# Patient Record
Sex: Female | Born: 1980 | Race: White | Hispanic: No | Marital: Married | Smoking: Never smoker
Health system: Southern US, Community
[De-identification: ages and names within clinical notes are randomized; demographics above are authoritative.]

## PROBLEM LIST (undated history)

## (undated) DIAGNOSIS — G43909 Migraine, unspecified, not intractable, without status migrainosus: Secondary | ICD-10-CM

## (undated) DIAGNOSIS — F419 Anxiety disorder, unspecified: Secondary | ICD-10-CM

## (undated) DIAGNOSIS — E109 Type 1 diabetes mellitus without complications: Secondary | ICD-10-CM

## (undated) DIAGNOSIS — E282 Polycystic ovarian syndrome: Secondary | ICD-10-CM

## (undated) DIAGNOSIS — Z8619 Personal history of other infectious and parasitic diseases: Secondary | ICD-10-CM

## (undated) HISTORY — DX: Type 1 diabetes mellitus without complications: E10.9

## (undated) HISTORY — DX: Polycystic ovarian syndrome: E28.2

## (undated) HISTORY — DX: Anxiety disorder, unspecified: F41.9

## (undated) HISTORY — DX: Personal history of other infectious and parasitic diseases: Z86.19

## (undated) HISTORY — DX: Migraine, unspecified, not intractable, without status migrainosus: G43.909

---
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
## 2004-03-21 ENCOUNTER — Observation Stay (HOSPITAL_COMMUNITY): Admission: AD | Admit: 2004-03-21 | Discharge: 2004-03-22 | Payer: Self-pay | Admitting: Family Medicine

## 2004-11-11 ENCOUNTER — Ambulatory Visit (HOSPITAL_COMMUNITY): Admission: RE | Admit: 2004-11-11 | Discharge: 2004-11-11 | Payer: Self-pay | Admitting: Family Medicine

## 2005-05-09 ENCOUNTER — Emergency Department (HOSPITAL_COMMUNITY): Admission: EM | Admit: 2005-05-09 | Discharge: 2005-05-09 | Payer: Self-pay | Admitting: Emergency Medicine

## 2007-04-19 ENCOUNTER — Ambulatory Visit (HOSPITAL_COMMUNITY): Admission: RE | Admit: 2007-04-19 | Discharge: 2007-04-19 | Payer: Self-pay | Admitting: Family Medicine

## 2010-04-13 HISTORY — PX: BREAST BIOPSY: SHX20

## 2010-08-03 ENCOUNTER — Emergency Department (HOSPITAL_COMMUNITY): Payer: BC Managed Care – PPO

## 2010-08-03 ENCOUNTER — Emergency Department (HOSPITAL_COMMUNITY)
Admission: EM | Admit: 2010-08-03 | Discharge: 2010-08-03 | Disposition: A | Discharge status: Home / Self Care | Payer: BC Managed Care – PPO | Attending: Emergency Medicine | Admitting: Emergency Medicine

## 2010-08-03 DIAGNOSIS — E109 Type 1 diabetes mellitus without complications: Secondary | ICD-10-CM | POA: Insufficient documentation

## 2010-08-03 DIAGNOSIS — R197 Diarrhea, unspecified: Secondary | ICD-10-CM | POA: Insufficient documentation

## 2010-08-03 DIAGNOSIS — R109 Unspecified abdominal pain: Secondary | ICD-10-CM | POA: Insufficient documentation

## 2010-08-03 DIAGNOSIS — Z794 Long term (current) use of insulin: Secondary | ICD-10-CM | POA: Insufficient documentation

## 2010-08-03 DIAGNOSIS — R112 Nausea with vomiting, unspecified: Secondary | ICD-10-CM | POA: Insufficient documentation

## 2010-08-03 LAB — DIFFERENTIAL
Basophils Absolute: 0 10*3/uL (ref 0.0–0.1)
Basophils Relative: 0 % (ref 0–1)
Eosinophils Relative: 1 % (ref 0–5)
Monocytes Relative: 4 % (ref 3–12)

## 2010-08-03 LAB — CBC
Platelets: 287 10*3/uL (ref 150–400)
WBC: 16.8 10*3/uL — ABNORMAL HIGH (ref 4.0–10.5)

## 2010-08-03 LAB — BASIC METABOLIC PANEL
BUN: 12 mg/dL (ref 6–23)
Calcium: 9.1 mg/dL (ref 8.4–10.5)
Chloride: 100 mEq/L (ref 96–112)
Creatinine, Ser: 0.73 mg/dL (ref 0.4–1.2)
GFR calc Af Amer: >60 mL/min (ref >60)
GFR calc non Af Amer: >60 mL/min (ref >60)
Glucose, Bld: 145 mg/dL — ABNORMAL HIGH (ref 70–99)
Sodium: 134 mEq/L — ABNORMAL LOW (ref 135–145)

## 2010-08-03 LAB — GLUCOSE, CAPILLARY: Glucose-Capillary: 158 mg/dL — ABNORMAL HIGH (ref 70–99)

## 2010-08-03 MED ORDER — IOHEXOL 300 MG/ML  SOLN
100.0000 mL | Freq: Once | INTRAMUSCULAR | Status: AC | PRN
  Administered 2010-08-03: 100 mL via INTRAVENOUS

## 2010-08-29 NOTE — H&P (Signed)
NAME:  Kimberly Becker, FARNELL NO.:  [id number]   MEDICAL RECORD NO.:  [id number]          PATIENT TYPE:  INP   LOCATION:  A325                          FACILITY:  [facility]   PHYSICIAN:  Donna Bernard, M.D.DATE OF BIRTH:  1981/02/23   DATE OF ADMISSION:  03/21/2004  DATE OF DISCHARGE:  LH                                HISTORY & PHYSICAL   CHIEF COMPLAINT:  Vomiting, fever, sore throat.   SUBJECTIVE:  This patient is a 30 year old white female with a history of  type 1 diabetes who presented to the office the day of admission with  several complaints.  The patient had been doing well until the day prior  when she developed onset of sore throat and headache.  The patient felt achy  all over.  She developed fever.  She had a T max of 102 the night prior to  admission.  The patient developed recurrent vomiting with inability to eat  or drink.  Of course, this is a concern when it comes to the patient's  underlying diabetes.  This morning, the patient's glucose was 82, and she  has vomited several times since then, unable to keep anything down.  The  patient notes a severe sore throat.  She has a history of recurrent throat  infections in the past.   CURRENT MEDICATIONS:  1.  Lantus 42 units q.h.s.  2.  Sliding scale Humalog before meals.  3.  Tylenol p.r.n. for fever.   PRIOR SURGERIES:  Remote appendectomy.   FAMILY HISTORY:  Positive for hypertension, diabetes, coronary artery  disease.   SOCIAL HISTORY:  The patient's is single.  She has one child.  No tobacco  use.  No alcohol use.   ALLERGIES:  Bee stings only.  No medications.   REVIEW OF SYSTEMS:  Otherwise negative.   PHYSICAL EXAMINATION:  VITAL SIGNS:  BP 112/70, T-max last night was 102.  Current temperature 99.  GENERAL:  The patient is alert, moderate malaise.  Glucose the morning of  admission was 82.  HEENT:  Exudative tonsillitis noted.  Tender anterior nodes in the neck.  NECK:  Otherwise  supple.  LUNGS:  Clear.  HEART:  Regular rate and rhythm.  No CVA tenderness.  ABDOMEN:  Good bowel sounds, no rebound, no guarding.  EXTREMITIES:  Normal.  NEUROLOGIC:  Exam is intact.   LABORATORY DATA:  Rapid strep screen is negative.   Significant labs:  CBC 21,000, white blood count with left shift.  Urinalysis normal.   IMPRESSION:  1.  Febrile illness with exudative tonsillitis and gastroenteritis features      with recurrent significant vomiting, and inability to keep anything      down.  2.  Type 1 diabetes with borderline hypoglycemia.   PLAN:  Admit for IV hydration, IV antibiotics.  Further blood work if  necessary.  Reglan as needed for nausea and Rocephin 1 g q.24 hours.  Further orders as noted on the chart.     Kristine Royal   WSL/MEDQ  D:  03/21/2004  T:  03/21/2004  Job:  [id number]

## 2010-11-11 ENCOUNTER — Encounter: Payer: Self-pay | Admitting: Endocrinology

## 2010-11-11 ENCOUNTER — Ambulatory Visit (INDEPENDENT_AMBULATORY_CARE_PROVIDER_SITE_OTHER): Payer: Medicaid Other | Admitting: Endocrinology

## 2010-11-11 DIAGNOSIS — E282 Polycystic ovarian syndrome: Secondary | ICD-10-CM

## 2010-11-11 DIAGNOSIS — E109 Type 1 diabetes mellitus without complications: Secondary | ICD-10-CM

## 2010-11-11 NOTE — Patient Instructions (Addendum)
good diet and exercise habits significanly improve the control of your diabetes.  please let me know if you wish to be referred to a dietician.  high blood sugar is very risky to your health.  you should see an eye doctor every year. controlling your blood pressure and cholesterol drastically reduces the damage diabetes does to your body.  this also applies to quitting smoking.  please discuss these with your doctor.  you should take an aspirin every day, unless you have been advised by a doctor not to. check your blood sugar 4 times a day--before the 3 meals, and at bedtime.  also check if you have symptoms of your blood sugar being too high or too low.  please keep a record of the readings and bring it to your next appointment here.  please call us sooner if you are having low blood sugar episodes. we will need to take this complex situation in stages For now, take humalog 10 units with breakfast, 8 with lunch, and 10 with supper.  You should take this no matter what your blood sugar is.   Please make a follow-up appointment in 1-2 weeks

## 2010-11-11 NOTE — Progress Notes (Signed)
Subjective:    Patient ID: Kimberly Becker, female    DOB: 11/10/1980, 30 y.o.   MRN: [id number]  HPI pt states 13 years h/o dm.  she is unaware of any chronic complications.  he has been on insulin since dx.  she takes lantus, and prn humalog. She reports several seizures in the middle of the night, over the past few years.  She says these seizures are described as severe tonus of the entire body, and assoc loc.   Since lantus was stopped, she still has cbg's of 80 in the middle of the night.  This is despite the fact that it has been more than 6 hrs since her last dose of humalog.  pt says his diet and exercise are "average." Past Medical History  Diagnosis Date  . Type I (juvenile type) diabetes mellitus without mention of complication, not stated as uncontrolled   . PCOS (polycystic ovarian syndrome)   . History of chickenpox     Past Surgical History  Procedure Date  . Appendectomy 1992  . Breast biopsy 2012    benign    History   Social History  . Marital Status: Single    Spouse Name: N/A    Number of Children: N/A  . Years of Education: 14   Occupational History  . Preschool Teacher    Social History Main Topics  . Smoking status: Former Games developer  . Smokeless tobacco: Not on file  . Alcohol Use: No  . Drug Use: No  . Sexually Active: Not on file   Other Topics Concern  . Not on file   Social History Narrative  . No narrative on file    Current Outpatient Prescriptions on File Prior to Visit  Medication Sig Dispense Refill  . fish oil-omega-3 fatty acids 1000 MG capsule Take 1 g by mouth daily.        . insulin glargine (LANTUS) 100 UNIT/ML injection Inject 20 Units into the skin at bedtime.        . insulin lispro (HUMALOG) 100 UNIT/ML injection Use as directed-sliding scale         No Known Allergies  Family History  Problem Relation Age of Onset  . Cancer Mother     Ovarian Cancer  . Heart disease Maternal Grandfather   . Diabetes Paternal  Grandfather   . Cancer Other     Breast Cancer-Aunt  no dm in immediate family  BP 112/78  Pulse 51  Temp(Src) 98.1 F (36.7 C) (Oral)  Ht 5\' 3"  (1.6 m)  Wt 223 lb (101.152 kg)  BMI 39.50 kg/m2  SpO2 96%  LMP 10/13/2010  Review of Systems denies weight loss, blurry vision, headache, chest pain, sob, n/v, urinary frequency, cramps, excessive diaphoresis, memory loss, depression, and easy bruising.  She reports irreg menses.    Objective:   Physical Exam VS: see vs page GEN: no distress HEAD: head: no deformity eyes: no periorbital swelling, no proptosis external nose and ears are normal mouth: no lesion seen NECK: supple, thyroid is not enlarged CHEST WALL: no deformity CV: reg rate and rhythm, no murmur ABD: abdomen is soft, nontender.  no hepatosplenomegaly.  not distended.  no hernia MUSCULOSKELETAL: muscle bulk and strength are grossly normal.  no obvious joint swelling.  gait is normal and steady EXTEMITIES: no deformity.  no ulcer on the feet.  feet are of normal color and temp.  no edema PULSES: dorsalis pedis intact bilat.  no carotid bruit NEURO:  cn  2-12 grossly intact.   readily moves all 4's.  sensation is intact to touch on the feet SKIN:  Normal texture and temperature.  No rash or suspicious lesion is visible.   NODES:  None palpable at the neck PSYCH: alert, oriented x3.  Does not appear anxious nor depressed.    outside test results are reviewed: A1c=7.3  Assessment & Plan:  She seems to have type 1 dm, but it will take several visits to understand her insulin requirements.  She says she is having hypoglycemia many hrs after a dose of humalog.  This would argue against type 1 dm, but i still think she has type 1 dm. Severe hypoglycemia, recurrent irreg menses, prob related to pco

## 2010-11-12 ENCOUNTER — Telehealth: Payer: Self-pay

## 2010-11-12 DIAGNOSIS — E282 Polycystic ovarian syndrome: Secondary | ICD-10-CM | POA: Insufficient documentation

## 2010-11-12 DIAGNOSIS — E109 Type 1 diabetes mellitus without complications: Secondary | ICD-10-CM | POA: Insufficient documentation

## 2010-11-12 NOTE — Telephone Encounter (Signed)
One time, take an extra 5 units of humalog now. Starting tonight, start lantus 5 units qhs.

## 2010-11-12 NOTE — Telephone Encounter (Signed)
Pt advised and expressed understanding.

## 2010-11-12 NOTE — Telephone Encounter (Signed)
Pt called stating her blood sugar has been increasing overnight. Before bed yesterday CBG was 200, at midnight 230, 2:30 am 300, 5 am 500 and 559 when she woke up this morning at 7:30. Pt is requesting advisement or medication adjustment, please advise.

## 2010-11-20 ENCOUNTER — Ambulatory Visit (INDEPENDENT_AMBULATORY_CARE_PROVIDER_SITE_OTHER): Payer: Medicaid Other | Admitting: Endocrinology

## 2010-11-20 ENCOUNTER — Encounter: Payer: Self-pay | Admitting: Endocrinology

## 2010-11-20 DIAGNOSIS — E109 Type 1 diabetes mellitus without complications: Secondary | ICD-10-CM

## 2010-11-20 NOTE — Patient Instructions (Addendum)
check your blood sugar 4 times a day--before the 3 meals, and at bedtime.  also check if you have symptoms of your blood sugar being too high or too low.  please keep a record of the readings and bring it to your next appointment here.  please call us sooner if you are having low blood sugar episodes. we will need to take this complex situation in stages For now, take humalog 11 units with breakfast, 9 with lunch, and 11 with supper.  You should take this no matter what your blood sugar is.   Please make a follow-up appointment in 1 month. For now, do not take the lantus.

## 2010-11-20 NOTE — Progress Notes (Signed)
  Subjective:    Patient ID: Kimberly Becker, female    DOB: February 19, 1981, 30 y.o.   MRN: [id number]  HPI she brings a record of her cbg's which i have reviewed today.  She has not been taking the lantus.  Instead, she has been taking 5 units of humalog qhs.  She is having hypoglycemia in the middle of the night.  cbg is about the same in am as at hs.  cbg's otherwise are mostly in the 200's.  There is no trend throughout the day.   Past Medical History  Diagnosis Date  . Type I (juvenile type) diabetes mellitus without mention of complication, not stated as uncontrolled   . PCOS (polycystic ovarian syndrome)   . History of chickenpox     Past Surgical History  Procedure Date  . Appendectomy 1992  . Breast biopsy 2012    benign    History   Social History  . Marital Status: Single    Spouse Name: N/A    Number of Children: N/A  . Years of Education: 14   Occupational History  . Preschool Teacher    Social History Main Topics  . Smoking status: Former Games developer  . Smokeless tobacco: Not on file  . Alcohol Use: No  . Drug Use: No  . Sexually Active: Not on file   Other Topics Concern  . Not on file   Social History Narrative  . No narrative on file    Current Outpatient Prescriptions on File Prior to Visit  Medication Sig Dispense Refill  . fish oil-omega-3 fatty acids 1000 MG capsule Take 1 g by mouth daily.        . insulin lispro (HUMALOG) 100 UNIT/ML injection 10 units with breakfast, 8 with lunch, and 10 with supper        No Known Allergies  Family History  Problem Relation Age of Onset  . Cancer Mother     Ovarian Cancer  . Heart disease Maternal Grandfather   . Diabetes Paternal Grandfather   . Cancer Other     Breast Cancer-Aunt    BP 108/68  Pulse 64  Temp(Src) 98.3 F (36.8 C) (Oral)  Ht 5\' 3"  (1.6 m)  Wt 223 lb (101.152 kg)  BMI 39.50 kg/m2  SpO2 98%  LMP 10/13/2010  Review of Systems denies hypoglycemia    Objective:   Physical  Exam GENERAL: no distress SKIN: Insulin injection sites at the anterior abdomen are normal.     Assessment & Plan:  Dm.  It is still not clear whether or not she needs the lantus

## 2010-12-30 ENCOUNTER — Ambulatory Visit (INDEPENDENT_AMBULATORY_CARE_PROVIDER_SITE_OTHER): Payer: Medicaid Other | Admitting: Endocrinology

## 2010-12-30 ENCOUNTER — Encounter: Payer: Self-pay | Admitting: Endocrinology

## 2010-12-30 VITALS — BP 108/66 | HR 82 | Temp 98.7°F | Ht 63.0 in | Wt 219.8 lb

## 2010-12-30 DIAGNOSIS — E109 Type 1 diabetes mellitus without complications: Secondary | ICD-10-CM

## 2010-12-30 MED ORDER — INSULIN GLARGINE 100 UNIT/ML ~~LOC~~ SOLN: 5.0000 [IU] | Freq: Every day | SUBCUTANEOUS | Status: DC

## 2010-12-30 MED ORDER — INSULIN LISPRO 100 UNIT/ML ~~LOC~~ SOLN: SUBCUTANEOUS | Status: DC

## 2010-12-30 NOTE — Progress Notes (Signed)
  Subjective:    Patient ID: Kimberly Becker, female    DOB: Jun 12, 1980, 30 y.o.   MRN: [id number]  HPI pt states she feels well in general.  she brings a record of her cbg's which i have reviewed today.  It varies from 100-400.  It is in general highest in am, and lower at all other times, but not necessarily so. Past Medical History  Diagnosis Date  . Type I (juvenile type) diabetes mellitus without mention of complication, not stated as uncontrolled   . PCOS (polycystic ovarian syndrome)   . History of chickenpox     Past Surgical History  Procedure Date  . Appendectomy 1992  . Breast biopsy 2012    benign    History   Social History  . Marital Status: Single    Spouse Name: N/A    Number of Children: N/A  . Years of Education: 14   Occupational History  . Preschool Teacher    Social History Main Topics  . Smoking status: Former Games developer  . Smokeless tobacco: Not on file  . Alcohol Use: No  . Drug Use: No  . Sexually Active: Not on file   Other Topics Concern  . Not on file   Social History Narrative  . No narrative on file    Current Outpatient Prescriptions on File Prior to Visit  Medication Sig Dispense Refill  . fish oil-omega-3 fatty acids 1000 MG capsule Take 1 g by mouth daily.          No Known Allergies  Family History  Problem Relation Age of Onset  . Cancer Mother     Ovarian Cancer  . Heart disease Maternal Grandfather   . Diabetes Paternal Grandfather   . Cancer Other     Breast Cancer-Aunt    BP 108/66  Pulse 82  Temp(Src) 98.7 F (37.1 C) (Oral)  Ht 5\' 3"  (1.6 m)  Wt 219 lb 12.8 oz (99.701 kg)  BMI 38.94 kg/m2  SpO2 96%  LMP 12/08/2010  Review of Systems denies hypoglycemia     Objective:   Physical Exam VITAL SIGNS:  See vs page GENERAL: no distress SKIN: Insulin injection sites at the anterior abdomen are normal     Assessment & Plan:  Dm.  Based on the pattern of her cbg's, she needs resumption of the lantus,  and a slight increase of her overall number of units per day.

## 2010-12-30 NOTE — Patient Instructions (Addendum)
check your blood sugar 4 times a day--before the 3 meals, and at bedtime.  also check if you have symptoms of your blood sugar being too high or too low.  please keep a record of the readings and bring it to your next appointment here.  please call us sooner if you are having low blood sugar episodes. we will need to take this complex situation in stages reduce humalog to 10 units with breakfast, 8 with lunch, and 10 with supper.  You should take this no matter what your blood sugar is.   Please make a follow-up appointment in 1 month. Resume the lantus, 5 units at bedtime

## 2010-12-31 ENCOUNTER — Telehealth: Payer: Self-pay | Admitting: *Deleted

## 2010-12-31 NOTE — Telephone Encounter (Signed)
R'cd fax from [employer] for PA for [employer]. Humalog vial is preferred over pen per pt's insurance. Pt will need PA for Humalog Kwikpen.

## 2010-12-31 NOTE — Telephone Encounter (Signed)
Left message for pt to callback office.  

## 2010-12-31 NOTE — Telephone Encounter (Signed)
i would be happy to do pa.  However, the form will want me to attest to a visual or hand problem making it impossible or unsafe to take the vial.  Since there is a charge for the pa form, please ask pt:  Would she qualify for the pen?

## 2011-01-02 MED ORDER — INSULIN ASPART 100 UNIT/ML ~~LOC~~ SOLN: 3.0000 [IU] | Freq: Three times a day (TID) | SUBCUTANEOUS | Status: DC

## 2011-01-02 NOTE — Telephone Encounter (Signed)
Pt states that she does wish to proceed with PA-however insurance preferred alternative is the Novolog flexpen

## 2011-01-02 NOTE — Telephone Encounter (Signed)
i changed to novolog, and sent rx

## 2011-01-02 NOTE — Telephone Encounter (Signed)
Pt advised per pharmacy

## 2011-01-02 NOTE — Telephone Encounter (Signed)
Left message for pt to callback office.  

## 2011-01-22 ENCOUNTER — Ambulatory Visit: Payer: Medicaid Other | Admitting: Endocrinology

## 2011-02-09 ENCOUNTER — Ambulatory Visit: Payer: Medicaid Other | Admitting: Endocrinology

## 2011-09-02 ENCOUNTER — Ambulatory Visit (HOSPITAL_COMMUNITY)
Admission: RE | Admit: 2011-09-02 | Discharge: 2011-09-02 | Disposition: A | Discharge status: Home / Self Care | Payer: BC Managed Care – PPO | Source: Ambulatory Visit | Attending: Family Medicine | Admitting: Family Medicine

## 2011-09-02 ENCOUNTER — Other Ambulatory Visit: Payer: Self-pay | Admitting: Family Medicine

## 2011-09-02 DIAGNOSIS — W19XXXA Unspecified fall, initial encounter: Secondary | ICD-10-CM | POA: Insufficient documentation

## 2011-09-02 DIAGNOSIS — M25511 Pain in right shoulder: Secondary | ICD-10-CM

## 2011-09-02 DIAGNOSIS — M25519 Pain in unspecified shoulder: Secondary | ICD-10-CM | POA: Insufficient documentation

## 2012-04-26 ENCOUNTER — Telehealth: Payer: Self-pay | Admitting: Endocrinology

## 2012-04-26 NOTE — Telephone Encounter (Signed)
Transferred Care

## 2012-04-29 ENCOUNTER — Telehealth: Payer: Self-pay | Admitting: *Deleted

## 2012-04-29 NOTE — Telephone Encounter (Signed)
FORMS FOR MED-CARE FAXED BACK TO [facility]CARE. PATIENT IS NOT A PATIENT HERE. SEES HER PCP ONLY.

## 2012-07-11 ENCOUNTER — Other Ambulatory Visit: Payer: Self-pay | Admitting: Nurse Practitioner

## 2012-08-08 ENCOUNTER — Encounter: Payer: Self-pay | Admitting: *Deleted

## 2012-08-09 ENCOUNTER — Other Ambulatory Visit: Payer: Self-pay | Admitting: Family Medicine

## 2012-08-10 ENCOUNTER — Ambulatory Visit (INDEPENDENT_AMBULATORY_CARE_PROVIDER_SITE_OTHER): Payer: Medicaid Other | Admitting: Family Medicine

## 2012-08-10 ENCOUNTER — Encounter: Payer: Self-pay | Admitting: Family Medicine

## 2012-08-10 VITALS — BP 122/80 | HR 80 | Ht 62.5 in | Wt 218.4 lb

## 2012-08-10 DIAGNOSIS — Z0000 Encounter for general adult medical examination without abnormal findings: Secondary | ICD-10-CM

## 2012-08-10 DIAGNOSIS — Z79899 Other long term (current) drug therapy: Secondary | ICD-10-CM

## 2012-08-10 DIAGNOSIS — E119 Type 2 diabetes mellitus without complications: Secondary | ICD-10-CM

## 2012-08-10 DIAGNOSIS — G43909 Migraine, unspecified, not intractable, without status migrainosus: Secondary | ICD-10-CM | POA: Insufficient documentation

## 2012-08-10 LAB — BASIC METABOLIC PANEL
BUN: 12 mg/dL (ref 6–23)
CO2: 26 mEq/L (ref 19–32)
Calcium: 9.5 mg/dL (ref 8.4–10.5)
Creat: 0.78 mg/dL (ref 0.50–1.10)

## 2012-08-10 LAB — HEPATIC FUNCTION PANEL
ALT: 23 U/L (ref 0–35)
AST: 19 U/L (ref 0–37)
Albumin: 4 g/dL (ref 3.5–5.2)
Bilirubin, Direct: 0.1 mg/dL (ref 0.0–0.3)
Total Protein: 7.2 g/dL (ref 6.0–8.3)

## 2012-08-10 LAB — LIPID PANEL
Cholesterol: 220 mg/dL — ABNORMAL HIGH (ref 0–200)
HDL: 52 mg/dL (ref >39)
LDL Cholesterol: 134 mg/dL — ABNORMAL HIGH (ref 0–99)
Triglycerides: 169 mg/dL — ABNORMAL HIGH (ref <150)

## 2012-08-10 MED ORDER — TOPIRAMATE 100 MG PO TABS: 100.0000 mg | ORAL_TABLET | Freq: Two times a day (BID) | ORAL | Status: DC

## 2012-08-10 NOTE — Progress Notes (Signed)
RX called in on voicemail 

## 2012-08-10 NOTE — Patient Instructions (Signed)
Morning sugars goal is 95 to 130.  Increase Lantus to 12 unit  If am # are consistently above the goals If in the range of 170 or higher that is a sign that you can add 4 units to your amount of Lantus that you are using at nighttime. If  your sugars for the next few days are still in we'll high range add 4 more units if they're only slightly above the goal then add 2 units. You can certainly do these type of adjustments on your between visits. If your morning sugars are coming in below 95 that is a sign your on too much and you will  have to reduce the insulin  Minimize starches  vigourous walking 30 minutes 4 to 5 times a week.  Twice a week muscle work out  Public house manager daily/ yearly eye exam

## 2012-08-10 NOTE — Progress Notes (Signed)
  Subjective:    Patient ID: Kimberly Becker, female    DOB: Aug 07, 1980, 32 y.o.   MRN: [id number]  Diabetes She presents for her follow-up diabetic visit. She has type 1 diabetes mellitus. Her disease course has been stable. There are no hypoglycemic associated symptoms. There are no diabetic associated symptoms. There are no hypoglycemic complications. There are no diabetic complications. There are no known risk factors for coronary artery disease. Current diabetic treatment includes insulin injections. She is compliant with treatment all of the time.   She does relate that her morning sugars have been running in the 120-200 range she recently stopped drinking [employer]. She understands the importance of maintaining a low sugar low starch diet she understands exercise is important she is going to try to lose weight. She has significant weight issues currently.  She denies nausea vomiting diarrhea she does admit to increased thirst and urination she denies blurred vision. No chest pressure tightness or pain.  She has severe migraines at least 2 or 3 times a month despite using Topamax she would like to try increasing the dose she states that she is tolerating the medication. She denies sweats chills she does states is mainly a throbbing headache with nausea and vomiting.  Patient does relate that she had an eye checkup about a year ago and is planning on getting eye checkup in the near future. She is going back to school. Currently without work. Stress levels are manageable.she is not smoking.   Review of Systemssee above.  Family history social history reviewed.     Objective:   Physical Exam Vital signs are stable. Eardrums normal throat is normal neck no masses lungs are clear no crackles heart is regular no murmurs pulses are normal skin warm dry extremities no edema foot exam normal there is some cracking on the heel but no bleeding noted no infection noted.       Assessment &  Plan:  Diabetes type 1 subpar control increase Lantus at night continue Humalog sliding scale with meals. Continue if there is any other problems otherwise adjust accordingly discussed today and instructions given. A1c is moderately elevated hopefully with her stopping sodas and exercising in 3 months A1c will be in a much better range the importance of daily foot exams yearly eye exams were discussed yearly labs are indicated. 25 minutes spent with patient.  Migraines-we talked about the importance of getting these under control increase Topamax 100 mg twice a day if she is having problems she is to let us know

## 2012-08-22 ENCOUNTER — Telehealth: Payer: Self-pay | Admitting: Nurse Practitioner

## 2012-08-22 ENCOUNTER — Ambulatory Visit (INDEPENDENT_AMBULATORY_CARE_PROVIDER_SITE_OTHER): Payer: Medicaid Other | Admitting: Nurse Practitioner

## 2012-08-22 ENCOUNTER — Encounter: Payer: Self-pay | Admitting: Nurse Practitioner

## 2012-08-22 VITALS — BP 138/80 | HR 70 | Temp 98.6°F | Wt 218.0 lb

## 2012-08-22 DIAGNOSIS — E1165 Type 2 diabetes mellitus with hyperglycemia: Secondary | ICD-10-CM

## 2012-08-22 DIAGNOSIS — E109 Type 1 diabetes mellitus without complications: Secondary | ICD-10-CM

## 2012-08-22 DIAGNOSIS — E161 Other hypoglycemia: Secondary | ICD-10-CM

## 2012-08-22 DIAGNOSIS — E785 Hyperlipidemia, unspecified: Secondary | ICD-10-CM

## 2012-08-22 DIAGNOSIS — E162 Hypoglycemia, unspecified: Secondary | ICD-10-CM

## 2012-08-22 DIAGNOSIS — J0100 Acute maxillary sinusitis, unspecified: Secondary | ICD-10-CM

## 2012-08-22 MED ORDER — FLUCONAZOLE 150 MG PO TABS: ORAL_TABLET | ORAL | Status: DC

## 2012-08-22 MED ORDER — HUMULIN N PEN 100 UNIT/ML ~~LOC~~ SUPN: 10.0000 [IU] | PEN_INJECTOR | Freq: Every day | SUBCUTANEOUS | Status: DC

## 2012-08-22 MED ORDER — AMOXICILLIN-POT CLAVULANATE 875-125 MG PO TABS: 1.0000 | ORAL_TABLET | Freq: Two times a day (BID) | ORAL | Status: DC

## 2012-08-22 NOTE — Telephone Encounter (Signed)
Discussed with pt

## 2012-08-22 NOTE — Telephone Encounter (Signed)
Please call patient and let her know I sent in RX for insulin pen as we discussed.  She will take 7 units in the AM and 3 units in the PM.  Watch sugars carefully and call if any problems.  We may need to adjust dosing over time.

## 2012-08-24 ENCOUNTER — Encounter: Payer: Self-pay | Admitting: Nurse Practitioner

## 2012-08-24 NOTE — Progress Notes (Signed)
Subjective:  Patient presents to discuss her most recent blood work. Also having some sinus symptoms that began on 5/2. Sinus pressure. Watery eyes. 2 days ago mucus turned green in color, maxillary area sinus pressure. Postnasal drainage. No fever. Irritated throat. No wheezing. Occasional cough worse at night. Gets regular eye exams. Has been taking Lantus every day but changing dose based on a sliding scale discussed at her last visit. Patient experiencing very low blood sugars about every night, states she has to get up about every 2 hours check her sugar and many times has to eat. Has stopped drinking [facility]. Now drinking more unsweetened or low sugar tea. Fasting sugars in the morning running low 200s on average. Later in the morning and late afternoon they're running much better. Using her sliding scale about twice per day on average. Currently on 10-14 units of Lantus at bedtime. Has a very strong family history of early heart disease, her mother passed away at 37 years old, her maternal grandmother had her first MI at age 40.  Objective:   BP 138/80  Pulse 70  Temp(Src) 98.6 F (37 C) (Oral)  Wt 218 lb (98.884 kg)  BMI 39.21 kg/m2 NAD. Alert, oriented. TMs clear effusion, no erythema. Pharynx erythematous with green PND noted. Neck supple with mild soft nontender adenopathy. Lungs clear. Heart regular rate rhythm. See lab work dated 08/09/2012.  Assessment:Type 2 diabetes mellitus, uncontrolled  Hyperlipidemia LDL goal < 100  Maxillary sinusitis, acute  Reactive hypoglycemia  Plan: Low-fat low-cholesterol diet. Continue to work on decreasing sugar and simple carbs in her diet. Repeat hemoglobin A 1C and lipid profile in 3 months. Office visit at that time. Explained that she may be having low blood sugars during the night due to her insulin, recommend trial of NPH to allow [place] more flexibility with her dosing. Patient to contact us with her blood sugar results. Meds ordered this  encounter  Medications  . amoxicillin-clavulanate (AUGMENTIN) 875-125 MG per tablet    Sig: Take 1 tablet by mouth 2 (two) times daily.    Dispense:  20 tablet    Refill:  0    Order Specific Question:  Supervising Provider    Answer:  Merlyn Albert [[id number]]  . fluconazole (DIFLUCAN) 150 MG tablet    Sig: One po qd prn yeast infection    Dispense:  2 tablet    Refill:  5    Order Specific Question:  Supervising Provider    Answer:  Merlyn Albert [[id number]]  . HUMULIN N PEN 100 UNIT/ML SUPN    Sig: Inject 10 Units into the skin daily. Inject 7 units sq in am and 3 units sq at bedtime (TOTAL 10 units per day)    Dispense:  1 pen    Refill:  2    Order Specific Question:  Supervising Provider    Answer:  Riccardo Dubin

## 2012-08-24 NOTE — Assessment & Plan Note (Signed)
Blood sugar dropping very low at night.  Switched to NPH insulin from Lantus to allow [place] more flexibility with night time dosing.

## 2012-08-25 ENCOUNTER — Telehealth: Payer: Self-pay | Admitting: Family Medicine

## 2012-08-25 NOTE — Telephone Encounter (Signed)
Wants to let Kimberly Becker know result of Glucose readings:  Reading have been on the high side.  This AM it was 200, on May 14th it was 95, on May 13th 175.  Wants to know if she should adjust her insulin?  Please call patient.

## 2012-08-29 NOTE — Telephone Encounter (Signed)
Increase NPH to 9 units in the morning and 5 units in the evening.  Call back by end of week with BS.

## 2012-08-29 NOTE — Telephone Encounter (Signed)
Notified patient to increase NPH to 9 units in the morning and 5 units in the evening. Call back by end of week with BS. Patient verbalized understanding.

## 2012-09-19 ENCOUNTER — Other Ambulatory Visit: Payer: Self-pay | Admitting: Nurse Practitioner

## 2012-09-30 ENCOUNTER — Telehealth: Payer: Self-pay | Admitting: Nurse Practitioner

## 2012-09-30 NOTE — Telephone Encounter (Signed)
Patient wanted to advise Kimberly Becker that she went back on Lantus due to glucose levels were high on Humulin Insulin.  Has an appointment with Kimberly Becker on July 28.  Thanks

## 2012-10-03 NOTE — Telephone Encounter (Signed)
Noted.  We will discuss more at her next visit. No need to call patient.

## 2012-10-17 ENCOUNTER — Telehealth: Payer: Self-pay | Admitting: Family Medicine

## 2012-10-17 NOTE — Telephone Encounter (Signed)
Patient needs BW paperwork-Please call when done

## 2012-10-18 NOTE — Telephone Encounter (Signed)
Kimberly Becker said three months seven wks ago. Call back in one mo for bw orders then plus ov

## 2012-10-18 NOTE — Telephone Encounter (Signed)
Patient notified

## 2012-11-07 ENCOUNTER — Ambulatory Visit (INDEPENDENT_AMBULATORY_CARE_PROVIDER_SITE_OTHER): Payer: Medicaid Other | Admitting: Nurse Practitioner

## 2012-11-07 ENCOUNTER — Encounter: Payer: Self-pay | Admitting: Nurse Practitioner

## 2012-11-07 VITALS — BP 122/78 | HR 80 | Wt 221.6 lb

## 2012-11-07 DIAGNOSIS — Z79899 Other long term (current) drug therapy: Secondary | ICD-10-CM

## 2012-11-07 DIAGNOSIS — D649 Anemia, unspecified: Secondary | ICD-10-CM

## 2012-11-07 DIAGNOSIS — E785 Hyperlipidemia, unspecified: Secondary | ICD-10-CM

## 2012-11-07 DIAGNOSIS — Z0000 Encounter for general adult medical examination without abnormal findings: Secondary | ICD-10-CM

## 2012-11-07 MED ORDER — PHENTERMINE HCL 37.5 MG PO TABS: 37.5000 mg | ORAL_TABLET | Freq: Every day | ORAL | Status: DC

## 2012-11-07 NOTE — Patient Instructions (Addendum)
Recommend Tdap at health department

## 2012-11-08 NOTE — Progress Notes (Signed)
Subjective:  Presents for routine followup. Sees a gynecologist in [place] for her female physicals. Their office is been trying to get proximal visitation for mammogram due to patient's history. Patient is also look into gene testing for BRCA. Using NovoLog 2-3 times per day. His back on her Lantus. Hypoglycemic episodes at nighttime are much improved. Doing sugars at least 7 times per 24 hours. Has been exercising. Doing better with her diet. Having difficulty losing weight. No chest pain or shortness of breath.  Objective:   BP 122/78  Pulse 80  Wt 221 lb 9.6 oz (100.517 kg)  BMI 39.86 kg/m2  LMP 10/10/2012 NAD. Alert, oriented. Lungs clear. Heart regular rate rhythm. Lower extremities no edema.  Assessment:Anemia - Plan: CBC with Differential  High risk medication use - Plan: Hepatic function panel  Hyperlipemia - Plan: Lipid panel  Routine general medical examination at a health care facility - Plan: TB Skin Test  Plan: Meds ordered this encounter  Medications  . phentermine (ADIPEX-P) 37.5 MG tablet    Sig: Take 1 tablet (37.5 mg total) by mouth daily before breakfast.    Dispense:  30 tablet    Refill:  0    Order Specific Question:  Supervising Provider    Answer:  Riccardo Dubin   Reviewed potential adverse effects of phentermine. DC med and call if any problems. Discussed importance of regular activity and healthy diet. Given new prescription for lancets and strips. Recheck in one month because of use of phentermine, routine followup in 3 months.

## 2012-11-08 NOTE — Assessment & Plan Note (Signed)
Start phentermine as directed. Recheck in one month.

## 2012-11-09 ENCOUNTER — Ambulatory Visit: Payer: Medicaid Other | Admitting: Nurse Practitioner

## 2012-11-09 LAB — TB SKIN TEST: TB Skin Test: NEGATIVE

## 2012-11-10 LAB — CBC WITH DIFFERENTIAL/PLATELET
Basophils Absolute: 0.1 10*3/uL (ref 0.0–0.1)
Eosinophils Absolute: 0.1 10*3/uL (ref 0.0–0.7)
Eosinophils Relative: 1 % (ref 0–5)
HCT: 41.7 % (ref 36.0–46.0)
Lymphocytes Relative: 18 % (ref 12–46)
MCH: 29.2 pg (ref 26.0–34.0)
MCHC: 33.3 g/dL (ref 30.0–36.0)
MCV: 87.6 fL (ref 78.0–100.0)
Monocytes Absolute: 0.8 10*3/uL (ref 0.1–1.0)
RDW: 13.5 % (ref 11.5–15.5)
WBC: 12.1 10*3/uL — ABNORMAL HIGH (ref 4.0–10.5)

## 2012-11-10 LAB — HEPATIC FUNCTION PANEL
ALT: 16 U/L (ref 0–35)
Indirect Bilirubin: 0.4 mg/dL (ref 0.0–0.9)
Total Protein: 7.3 g/dL (ref 6.0–8.3)

## 2012-11-10 LAB — LIPID PANEL
LDL Cholesterol: 136 mg/dL — ABNORMAL HIGH (ref 0–99)
Triglycerides: 116 mg/dL (ref <150)

## 2012-11-11 ENCOUNTER — Other Ambulatory Visit: Payer: Self-pay | Admitting: Nurse Practitioner

## 2012-11-11 NOTE — Telephone Encounter (Signed)
insulin aspart (NOVOLOG FLEXPEN) 100 UNIT/ML injection  Refill please [facility] pt is out

## 2012-11-15 ENCOUNTER — Other Ambulatory Visit: Payer: Self-pay | Admitting: Nurse Practitioner

## 2012-11-15 DIAGNOSIS — E785 Hyperlipidemia, unspecified: Secondary | ICD-10-CM

## 2012-11-15 DIAGNOSIS — Z79899 Other long term (current) drug therapy: Secondary | ICD-10-CM

## 2012-11-15 MED ORDER — PRAVASTATIN SODIUM 10 MG PO TABS: 10.0000 mg | ORAL_TABLET | Freq: Every day | ORAL | Status: DC

## 2012-11-30 ENCOUNTER — Ambulatory Visit: Payer: Medicaid Other | Admitting: Family Medicine

## 2012-12-09 ENCOUNTER — Encounter: Payer: Self-pay | Admitting: Nurse Practitioner

## 2012-12-09 ENCOUNTER — Ambulatory Visit (INDEPENDENT_AMBULATORY_CARE_PROVIDER_SITE_OTHER): Payer: Medicaid Other | Admitting: Nurse Practitioner

## 2012-12-09 VITALS — BP 132/82 | Temp 98.4°F | Ht 62.5 in | Wt 213.0 lb

## 2012-12-09 DIAGNOSIS — I889 Nonspecific lymphadenitis, unspecified: Secondary | ICD-10-CM

## 2012-12-09 DIAGNOSIS — M62838 Other muscle spasm: Secondary | ICD-10-CM

## 2012-12-09 MED ORDER — INSULIN GLARGINE 100 UNIT/ML SOLOSTAR PEN: PEN_INJECTOR | SUBCUTANEOUS | Status: DC

## 2012-12-09 MED ORDER — PHENTERMINE HCL 37.5 MG PO TABS: 37.5000 mg | ORAL_TABLET | Freq: Every day | ORAL | Status: DC

## 2012-12-09 MED ORDER — CHLORZOXAZONE 500 MG PO TABS: 500.0000 mg | ORAL_TABLET | Freq: Three times a day (TID) | ORAL | Status: DC | PRN

## 2012-12-12 ENCOUNTER — Encounter: Payer: Self-pay | Admitting: Nurse Practitioner

## 2012-12-12 NOTE — Progress Notes (Signed)
Subjective:  Presents for routine followup. Doing well on phentermine. Denies any adverse affects. Blood sugars have improved. Has decreased the number of her NovoLog injections from 3-4 per day down to 2. Also complaints of a tender occipital lymph nodes bilaterally. No fever. No tick bite or scalp rash. No headache sore throat ear pain or cough. No unusual fatigue.  Objective:   BP 132/82  Temp(Src) 98.4 F (36.9 C) (Oral)  Ht 5' 2.5" (1.588 m)  Wt 213 lb (96.616 kg)  BMI 38.31 kg/m2 NAD. Alert, oriented. TMs mild clear effusion, no erythema. Pharynx clear. Neck supple with mild soft anterior cervical adenopathy. 2 small tender occipital lymph nodes noted on each side. Scalp is clear, no obvious rash or infection. Extremely tight tender lateral and cervical muscles noted in the neck area going into the trapezius and rhomboids.  Assessment:Muscle spasms of neck  Morbid obesity  Occipital lymphadenitis  Plan: Feel that most of the pain is due to extremely tight tender muscles. Recommend ice/heat applications. Stretching exercises. Massage. Meds ordered this encounter  Medications  . phentermine (ADIPEX-P) 37.5 MG tablet    Sig: Take 1 tablet (37.5 mg total) by mouth daily before breakfast.    Dispense:  30 tablet    Refill:  2    Order Specific Question:  Supervising Provider    Answer:  Merlyn Albert [[id number]]  . chlorzoxazone (PARAFON) 500 MG tablet    Sig: Take 1 tablet (500 mg total) by mouth 3 (three) times daily as needed for muscle spasms.    Dispense:  30 tablet    Refill:  0    Order Specific Question:  Supervising Provider    Answer:  Merlyn Albert [[id number]]  . Insulin Glargine (LANTUS SOLOSTAR) 100 UNIT/ML SOPN    Sig: INJECT UP TO 50 UNITS SUBCUTANEOUSLY AS DIRECTED    Dispense:  15 mL    Refill:  2    Order Specific Question:  Supervising Provider    Answer:  Merlyn Albert [[id number]]   Continue weight loss efforts. No antibiotics at this time, call back next week  if no improvement in lymph nodes. Recheck in 3 months.

## 2012-12-12 NOTE — Assessment & Plan Note (Signed)
Continue Adipex-P 37.5 mg every morning. Recheck in 3 months.

## 2012-12-15 ENCOUNTER — Other Ambulatory Visit: Payer: Self-pay | Admitting: *Deleted

## 2012-12-15 MED ORDER — INSULIN GLARGINE 100 UNIT/ML SOLOSTAR PEN: PEN_INJECTOR | SUBCUTANEOUS | Status: DC

## 2013-02-12 ENCOUNTER — Other Ambulatory Visit: Payer: Self-pay | Admitting: Family Medicine

## 2013-02-13 ENCOUNTER — Other Ambulatory Visit: Payer: Self-pay | Admitting: *Deleted

## 2013-02-13 MED ORDER — PRAVASTATIN SODIUM 10 MG PO TABS: 10.0000 mg | ORAL_TABLET | Freq: Every day | ORAL | Status: DC

## 2013-03-13 ENCOUNTER — Ambulatory Visit: Payer: Medicaid Other | Admitting: Nurse Practitioner

## 2013-03-16 ENCOUNTER — Encounter: Payer: Self-pay | Admitting: Nurse Practitioner

## 2013-03-16 ENCOUNTER — Telehealth: Payer: Self-pay | Admitting: Nurse Practitioner

## 2013-03-16 ENCOUNTER — Ambulatory Visit (INDEPENDENT_AMBULATORY_CARE_PROVIDER_SITE_OTHER): Payer: Medicaid Other | Admitting: Nurse Practitioner

## 2013-03-16 VITALS — BP 124/90 | Temp 98.1°F | Ht 62.5 in | Wt 207.4 lb

## 2013-03-16 DIAGNOSIS — J209 Acute bronchitis, unspecified: Secondary | ICD-10-CM

## 2013-03-16 DIAGNOSIS — J069 Acute upper respiratory infection, unspecified: Secondary | ICD-10-CM

## 2013-03-16 DIAGNOSIS — Z79899 Other long term (current) drug therapy: Secondary | ICD-10-CM

## 2013-03-16 DIAGNOSIS — E785 Hyperlipidemia, unspecified: Secondary | ICD-10-CM

## 2013-03-16 MED ORDER — CEFTRIAXONE SODIUM 1 G IJ SOLR
500.0000 mg | Freq: Once | INTRAMUSCULAR | Status: AC
  Administered 2013-03-16: 500 mg via INTRAMUSCULAR

## 2013-03-16 MED ORDER — AZITHROMYCIN 250 MG PO TABS: ORAL_TABLET | ORAL | Status: DC

## 2013-03-16 MED ORDER — HYDROCODONE-HOMATROPINE 5-1.5 MG/5ML PO SYRP: 5.0000 mL | ORAL_SOLUTION | ORAL | Status: DC | PRN

## 2013-03-16 MED ORDER — BENZONATATE 100 MG PO CAPS: 100.0000 mg | ORAL_CAPSULE | Freq: Three times a day (TID) | ORAL | Status: DC | PRN

## 2013-03-16 NOTE — Telephone Encounter (Signed)
She would like this mailed to her

## 2013-03-16 NOTE — Telephone Encounter (Signed)
Patient needs BW paperwork °

## 2013-03-17 ENCOUNTER — Other Ambulatory Visit: Payer: Self-pay | Admitting: Nurse Practitioner

## 2013-03-20 DIAGNOSIS — E785 Hyperlipidemia, unspecified: Secondary | ICD-10-CM | POA: Insufficient documentation

## 2013-03-20 NOTE — Assessment & Plan Note (Signed)
Continue pravastatin as directed. Routine labs pending.

## 2013-03-20 NOTE — Telephone Encounter (Addendum)
FYI this is not a permanent situation-I spoke with patient when she came in at last appointment and she said that the child was just staying with her while her niece was getting some things together.

## 2013-03-20 NOTE — Progress Notes (Signed)
Subjective:  Presents with complaints of cough and congestion for the past 4 days. Frequent cough producing thick green mucus. Temp 101.8 this morning. Straining to cough, trying to produce thick mucus. Ear pain. Sore throat. Chest pain with deep breath or cough. Slight wheeze at times, using her albuterol inhaler 3-4 times per day. Blood sugars are running slightly low. Taking fluids well. Voiding normal limit. No acid reflux. Also needs paperwork for her repeat labs.  Objective:   BP 124/90  Temp(Src) 98.1 F (36.7 C) (Oral)  Ht 5' 2.5" (1.588 m)  Wt 207 lb 6 oz (94.065 kg)  BMI 37.30 kg/m2 NAD. Alert, oriented. TMs clear effusion, no erythema. Pharynx erythematous with green PND noted. Neck supple with mild soft slightly tender adenopathy. Lungs scattered faint expiratory crackles, no wheezing or tachypnea. Very frequent spasmodic nonproductive cough. Heart regular rate rhythm.  Assessment:Acute upper respiratory infections of unspecified site - Plan: cefTRIAXone (ROCEPHIN) injection 500 mg  Acute bronchitis - Plan: cefTRIAXone (ROCEPHIN) injection 500 mg  Bronchospasm with bronchitis, acute  Hyperlipemia - Plan: Lipid panel  High risk medication use - Plan: Hepatic function panel  Plan: Meds ordered this encounter  Medications  . azithromycin (ZITHROMAX Z-PAK) 250 MG tablet    Sig: Take 2 tablets (500 mg) on  Day 1,  followed by 1 tablet (250 mg) once daily on Days 2 through 5.    Dispense:  6 each    Refill:  0    Order Specific Question:  Supervising Provider    Answer:  Merlyn Albert [[id number]]  . benzonatate (TESSALON) 100 MG capsule    Sig: Take 1 capsule (100 mg total) by mouth 3 (three) times daily as needed for cough.    Dispense:  20 capsule    Refill:  0    Order Specific Question:  Supervising Provider    Answer:  Merlyn Albert [[id number]]  . HYDROcodone-homatropine (HYCODAN) 5-1.5 MG/5ML syrup    Sig: Take 5 mLs by mouth every 4 (four) hours as needed.   Dispense:  120 mL    Refill:  0    Order Specific Question:  Supervising Provider    Answer:  Merlyn Albert [[id number]]  . cefTRIAXone (ROCEPHIN) injection 500 mg    Sig:    Warning signs reviewed. Call back in 48 hours if no improvement in symptoms, call or go to ED sooner if worse.

## 2013-03-20 NOTE — Telephone Encounter (Signed)
Pt wants to know if you got an answer on her nephew coming here to be a patient. Didn't see anything in the computer or on your desk.

## 2013-03-20 NOTE — Telephone Encounter (Signed)
Sorry but I cannot find information among my notes. I remember patient asking me about this and advising her that her doctor would have to make that decision. Please call her and get information and pass on to her provider. The nephew is now living in her home. Thanks so much.

## 2013-03-22 ENCOUNTER — Other Ambulatory Visit: Payer: Self-pay | Admitting: Nurse Practitioner

## 2013-03-22 MED ORDER — AMOXICILLIN-POT CLAVULANATE 875-125 MG PO TABS: 1.0000 | ORAL_TABLET | Freq: Two times a day (BID) | ORAL | Status: DC

## 2013-03-22 NOTE — Progress Notes (Signed)
In with her son today, was seen last week. Cough better. Green mucus. Feel better but still low grade fever and head congestion. No wheezing.

## 2013-03-23 NOTE — Telephone Encounter (Signed)
Notified patient since this is a temporary situation (nephew is not living with them permanently) recommend he sees his regular provider. Patient verbalized understanding.

## 2013-03-23 NOTE — Telephone Encounter (Signed)
Since this is a temporary situation (nephew is not living with them permanently) recommend he sees his regular provider

## 2013-03-27 ENCOUNTER — Ambulatory Visit (INDEPENDENT_AMBULATORY_CARE_PROVIDER_SITE_OTHER): Payer: Medicaid Other | Admitting: Nurse Practitioner

## 2013-03-27 ENCOUNTER — Encounter: Payer: Self-pay | Admitting: Nurse Practitioner

## 2013-03-27 VITALS — BP 138/70 | Temp 98.2°F | Ht 62.0 in | Wt 212.0 lb

## 2013-03-27 DIAGNOSIS — E785 Hyperlipidemia, unspecified: Secondary | ICD-10-CM

## 2013-03-27 DIAGNOSIS — E109 Type 1 diabetes mellitus without complications: Secondary | ICD-10-CM

## 2013-03-27 DIAGNOSIS — G43909 Migraine, unspecified, not intractable, without status migrainosus: Secondary | ICD-10-CM

## 2013-03-27 LAB — GLUCOSE, POCT (MANUAL RESULT ENTRY): POC Glucose: 94 mg/dl (ref 70–99)

## 2013-03-27 MED ORDER — PRAVASTATIN SODIUM 10 MG PO TABS: 10.0000 mg | ORAL_TABLET | Freq: Every day | ORAL | Status: DC

## 2013-03-27 MED ORDER — TOPIRAMATE 100 MG PO TABS: 100.0000 mg | ORAL_TABLET | Freq: Two times a day (BID) | ORAL | Status: DC

## 2013-03-27 MED ORDER — INSULIN ASPART 100 UNIT/ML FLEXPEN: PEN_INJECTOR | SUBCUTANEOUS | Status: DC

## 2013-03-27 MED ORDER — INSULIN GLARGINE 100 UNIT/ML SOLOSTAR PEN: PEN_INJECTOR | SUBCUTANEOUS | Status: DC

## 2013-03-28 LAB — HEPATIC FUNCTION PANEL
ALT: 21 U/L (ref 0–35)
AST: 21 U/L (ref 0–37)
Alkaline Phosphatase: 64 U/L (ref 39–117)
Indirect Bilirubin: 0.3 mg/dL (ref 0.0–0.9)
Total Protein: 6.7 g/dL (ref 6.0–8.3)

## 2013-03-28 LAB — LIPID PANEL
Cholesterol: 162 mg/dL (ref 0–200)
LDL Cholesterol: 86 mg/dL (ref 0–99)
Total CHOL/HDL Ratio: 2.7 Ratio
Triglycerides: 87 mg/dL (ref <150)
VLDL: 17 mg/dL (ref 0–40)

## 2013-03-29 ENCOUNTER — Encounter: Payer: Self-pay | Admitting: Nurse Practitioner

## 2013-03-29 NOTE — Assessment & Plan Note (Signed)
Plan: Meds ordered this encounter  Medications  . topiramate (TOPAMAX) 100 MG tablet    Sig: Take 1 tablet (100 mg total) by mouth 2 (two) times daily.    Dispense:  60 tablet    Refill:  5    Order Specific Question:  Supervising Provider    Answer:  Merlyn Albert [[id number]]  . Insulin Glargine (LANTUS SOLOSTAR) 100 UNIT/ML SOPN    Sig: INJECT UP TO 50 UNITS SUBCUTANEOUSLY AS DIRECTED    Dispense:  15 mL    Refill:  2    Order Specific Question:  Supervising Provider    Answer:  Merlyn Albert [[id number]]  . insulin aspart (NOVOLOG FLEXPEN) 100 UNIT/ML SOPN FlexPen    Sig: INJECT UP TO 20 UNITS SUBCUTANEOUSLY 4 TIMES DAILY PER SLIDING SCALE AS DIRECTED    Dispense:  15 mL    Refill:  2    Order Specific Question:  Supervising Provider    Answer:  Merlyn Albert [[id number]]  . pravastatin (PRAVACHOL) 10 MG tablet    Sig: Take 1 tablet (10 mg total) by mouth daily.    Dispense:  30 tablet    Refill:  5    Order Specific Question:  Supervising Provider    Answer:  Merlyn Albert [[id number]]   Decrease Lantus to 8 units in the evening, continue to decrease if she experiences hypoglycemia. Because her numbers are so sporadic, recommend referral to endocrinologist for evaluation. Reviewed at length emergency care for hypoglycemia. Patient has glucose tabs with her at all times. Recheck in 3-4 months.

## 2013-03-29 NOTE — Progress Notes (Signed)
Subjective:  Presents for recheck on her diabetes. Has sporadic hypoglycemia now that she is back on Lantus. Blood sugar was 190 this morning. Had a bran muffin, Cheerios and chocolate milk approximately 2 hours ago. While talking to patient it was noted she became very pale with difficulty focusing and putting her thoughts together. She tested her sugar with her own machine, her blood sugar was 40. At some candy and drank some regular soda here in the office. Her blood sugar before leaving was 94. Had very high blood sugars on a trial of twice a day dosing of NPH insulin. Topamax overall working well for her migraines, has noticed a slight increase with the holidays. Sinus symptoms are much improved see previous note. No chest pain shortness of breath.  Objective:   BP 138/70  Temp(Src) 98.2 F (36.8 C) (Oral)  Ht 5\' 2"  (1.575 m)  Wt 212 lb (96.163 kg)  BMI 38.77 kg/m2 NAD. Alert, oriented. Once her blood sugar came back to normal, all of her symptoms were gone. TMs mild clear effusion, no erythema. Pharynx clear. Neck supple with minimal adenopathy. Lungs clear. Heart regular rate rhythm. Lower extremities no edema. Hemoglobin A1c 6.5, much improved from 8.4.  Assessment:Type I (juvenile type) diabetes mellitus without mention of complication, not stated as uncontrolled - Plan: Hemoglobin A1c, POCT glucose (manual entry)  Migraines  Hyperlipemia  Plan: Meds ordered this encounter  Medications  . topiramate (TOPAMAX) 100 MG tablet    Sig: Take 1 tablet (100 mg total) by mouth 2 (two) times daily.    Dispense:  60 tablet    Refill:  5    Order Specific Question:  Supervising Provider    Answer:  Merlyn Albert [[id number]]  . Insulin Glargine (LANTUS SOLOSTAR) 100 UNIT/ML SOPN    Sig: INJECT UP TO 50 UNITS SUBCUTANEOUSLY AS DIRECTED    Dispense:  15 mL    Refill:  2    Order Specific Question:  Supervising Provider    Answer:  Merlyn Albert [[id number]]  . insulin aspart (NOVOLOG FLEXPEN)  100 UNIT/ML SOPN FlexPen    Sig: INJECT UP TO 20 UNITS SUBCUTANEOUSLY 4 TIMES DAILY PER SLIDING SCALE AS DIRECTED    Dispense:  15 mL    Refill:  2    Order Specific Question:  Supervising Provider    Answer:  Merlyn Albert [[id number]]  . pravastatin (PRAVACHOL) 10 MG tablet    Sig: Take 1 tablet (10 mg total) by mouth daily.    Dispense:  30 tablet    Refill:  5    Order Specific Question:  Supervising Provider    Answer:  Merlyn Albert [[id number]]   Decrease Lantus to 8 units in the evening, continue to decrease if she experiences hypoglycemia. Because her numbers are so sporadic, recommend referral to endocrinologist for evaluation. Reviewed at length emergency care for hypoglycemia. Patient has glucose tabs with her at all times. Recheck in 3-4 months.

## 2013-03-29 NOTE — Assessment & Plan Note (Signed)
Plan: Meds ordered this encounter  Medications  . topiramate (TOPAMAX) 100 MG tablet    Sig: Take 1 tablet (100 mg total) by mouth 2 (two) times daily.    Dispense:  60 tablet    Refill:  5    Order Specific Question:  Supervising Provider    Answer:  LUKING, WILLIAM S [[id number]]  . Insulin Glargine (LANTUS SOLOSTAR) 100 UNIT/ML SOPN    Sig: INJECT UP TO 50 UNITS SUBCUTANEOUSLY AS DIRECTED    Dispense:  15 mL    Refill:  2    Order Specific Question:  Supervising Provider    Answer:  LUKING, WILLIAM S [[id number]]  . insulin aspart (NOVOLOG FLEXPEN) 100 UNIT/ML SOPN FlexPen    Sig: INJECT UP TO 20 UNITS SUBCUTANEOUSLY 4 TIMES DAILY PER SLIDING SCALE AS DIRECTED    Dispense:  15 mL    Refill:  2    Order Specific Question:  Supervising Provider    Answer:  LUKING, WILLIAM S [[id number]]  . pravastatin (PRAVACHOL) 10 MG tablet    Sig: Take 1 tablet (10 mg total) by mouth daily.    Dispense:  30 tablet    Refill:  5    Order Specific Question:  Supervising Provider    Answer:  LUKING, WILLIAM S [[id number]]   Decrease Lantus to 8 units in the evening, continue to decrease if she experiences hypoglycemia. Because her numbers are so sporadic, recommend referral to endocrinologist for evaluation. Reviewed at length emergency care for hypoglycemia. Patient has glucose tabs with her at all times. Recheck in 3-4 months. 

## 2013-04-25 ENCOUNTER — Other Ambulatory Visit: Payer: Self-pay | Admitting: Family Medicine

## 2013-04-25 ENCOUNTER — Telehealth: Payer: Self-pay | Admitting: Family Medicine

## 2013-04-25 DIAGNOSIS — E1065 Type 1 diabetes mellitus with hyperglycemia: Secondary | ICD-10-CM

## 2013-04-25 DIAGNOSIS — IMO0002 Reserved for concepts with insufficient information to code with codable children: Secondary | ICD-10-CM

## 2013-04-25 DIAGNOSIS — E108 Type 1 diabetes mellitus with unspecified complications: Principal | ICD-10-CM

## 2013-04-25 NOTE — Telephone Encounter (Signed)
Left message on voicemail notifying patient  

## 2013-04-25 NOTE — Telephone Encounter (Signed)
Patient said that she absolutely cannot see the endocrinologist that [place] was [place] in [place] because of past issues with him and would like a referral elsewhere. [place] [facility] [place] [facility] [place]

## 2013-04-25 NOTE — Telephone Encounter (Signed)
A new consult was put in today. Enid Derry[place]rendale will be working on it

## 2013-06-02 ENCOUNTER — Ambulatory Visit: Payer: BC Managed Care – PPO | Admitting: Internal Medicine

## 2013-06-09 ENCOUNTER — Ambulatory Visit: Payer: BC Managed Care – PPO | Admitting: Internal Medicine

## 2013-06-12 ENCOUNTER — Encounter: Payer: Self-pay | Admitting: Internal Medicine

## 2013-06-12 ENCOUNTER — Ambulatory Visit (INDEPENDENT_AMBULATORY_CARE_PROVIDER_SITE_OTHER): Payer: Medicaid Other | Admitting: Internal Medicine

## 2013-06-12 VITALS — BP 116/70 | HR 90 | Temp 97.7°F | Resp 12 | Wt 211.0 lb

## 2013-06-12 DIAGNOSIS — E109 Type 1 diabetes mellitus without complications: Secondary | ICD-10-CM

## 2013-06-12 NOTE — Patient Instructions (Signed)
Please come back for labs only at 8 am for a HbA1c and cortisol and ACTH level. Please change the insulin as follows: - Lantus 3 units in am - NovoLog  3 units with breakfast 3 units with lunch if you eat lunch 3 units with dinner Please return in 1 month with your sugar log.   Basic Rules for Patients with Type I Diabetes Mellitus  1. The American Diabetes Association (ADA) recommended targets: - fasting sugar <130 - after meal sugar <180 - HbA1C <7%  2. Engage in ?150 min moderate exercise per week  3. Make sure you have ?8h of sleep every night as this helps both blood sugars and your weight.  4. Always keep a sugar log (not only record in your meter) and bring it to all appointments with [place].  5. If you are on a pump, know how to access the settings and to modify the parameters.  6.  Remember, you can always call the number on the back of the pump for emergencies related to the pump.  7. "15-15 rule" for hypoglycemia: if sugars are low, take 15 g of carbs** ("fast sugar" - e.g. 4 glucose tablets, 4 oz orange juice), wait 15 min, then check sugars again. If still <80, repeat. Continue  until your sugars >80, then eat a normal meal.   8. Teach family members and coworkers to inject glucagon. Have a glucagon set at home and one at work. They should call 911 after using the set.  9. If you are on a pump, set "insulin on board" time for 5 hours (if your sugars tend to be higher, can use 4 hours).   10. If you are on a pump, use the "dual wave bolus" setting for high fat foods (e.g. pizza). Start with a setting of 50%-50% (50% instant bolus and 50% prolonged bolus over 3h, for e.g.).    11. If you are on a pump, make sure the basal daily insulin dose is approximately equal (not larger) to the daily insulin you get from boluses, otherwise you are at risk for hypoglycemia.  12. Check sugar before driving. If <100, correct, and only start driving if sugars rise ?811. Check sugar every  hour when on a long drive.  13. Check sugar before exercising. If <100, correct, and only start exercising if sugars rise ?100. Check sugar every hour when on a long exercise routine and 1h after you finished exercising.   If >250, check urine for ketones. If you have moderate-large ketones in urine, do not start exercise. Hydrate yourself with clear liquids and correct the high sugar. Recheck sugars and ketones before attempting to exercise.  Be aware that you might need less insulin when exercising.  *intense, short, exercise bursts can increase your sugars, but  *less intense, longer (>1h), exercise routines can decrease your sugars.  If you are on a pump, you might need to decrease your basal rate by 10% or more (or even disconnect your pump) while you exercise to prevent low sugars. Do not disconnect your pump by more than 3 hours at a time! You also might need to decrease your insulin bolus for the meal prior to your exercise time by 20% or more.  14. Make sure you have a MedAlert bracelet or pendant mentioning "Type I Diabetes Mellitus". If you have a prior episode of severe hypoglycemia or hypoglycemia unawareness, it should also mention this.  15. Please do not walk barefoot. Inspect your feet for sores/cuts and  let us know if you have them.  16. Please call [facility] with any questions and concerns [phone]).   **E.g. of "fast carbs":   first choice (15 g):  1 tube glucose gel, GlucoPouch 15, 2 oz glucose liquid   second choice (15-16 g):  3 or 4 glucose tablets (best taken  with water), 15 Dextrose Bits chewable   third choice (15-20 g):   cup fruit juice,  cup regular soda, 1 cup skim milk,  1 cup sports drink   fourth choice (15-20 g):  1 small tube [place] gel (not frosting), 2 tbsp raisins, 1 tbsp table sugar,  candy, jelly beans, gum drops - check package for carb amount   (adapted from: Juluis RainierMcCall A.L. "Insulin therapy and hypoglycemia" Endocrinol  Metab Clin N Am 2012, 41: 57-87)

## 2013-06-12 NOTE — Progress Notes (Signed)
Patient ID: Kimberly Becker, female   DOB: 11-21-1980, 33 y.o.   MRN: [id number]  HPI: Kimberly Becker is a 33 y.o.-year-old female, referred by her PCP, Dr. Wolfgang Phoenix ([place], for management of DM1, uncontrolled, with complications (hypoglycemia >> seizures).  Patient has been diagnosed with diabetes in 34 (33 y/o); she started on insulin at dx.   Last hemoglobin A1c was: Lab Results  Component Value Date   HGBA1C 6.5* 03/27/2013   HGBA1C 8.4 08/10/2012   Pt is not on an insulin pump, not sure if [employer] would pay for it.   She is on: - Lantus 20 >> 15 >> now 10 units, then stopped middle of 05/2013 - 2/2 hypoglycemia - Novolog 5 units qid >> now bid   Pt checks her sugars 5x a day + 3x a night (no log) and they are higher then before- stress now as she just separated from her partner: - am: since stopping Lantus 270-280 - 2h after b'fast: n/c - before lunch: (20-) 40-112 - 2h after lunch: (27-) 45-275 - before dinner: 20s -130 - 2h after dinner: 200s - bedtime: 200s - nighttime: 70s >> gets up ans snacks during the night (gets up q 3h to check!) Her lows are dependent of activity level. She has frequent lows: 12 and 18 (last week), but also 20s and 30s; she has hypoglycemia awareness at 50. No previous hypoglycemia admission. She has had 1 seizure - when driving, 2 at home - at night. She has a glucagon kit at home. Highest sugar was 560 x seldom, but highest recently: 320. No previous DKA admissions, only at dx.    Pt's meals are: - Breakfast: cereal + milk - Lunch: skips - Dinner: meat + veggies + starch  - Snacks: snacks all day   - no CKD, last BUN/creatinine:  Lab Results  Component Value Date   BUN 12 08/09/2012   CREATININE 0.78 08/09/2012   - last set of lipids: Lab Results  Component Value Date   CHOL 162 03/28/2013   HDL 59 03/28/2013   LDLCALC 86 03/28/2013   TRIG 87 03/28/2013   CHOLHDL 2.7 03/28/2013  She is on Pravastatin - last eye  exam was >1 year ago - but [employer] does not cover it every year!!! No DR.  - no numbness and tingling in her feet.  Pt has FH of DM in M and PGF.  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  ROS: Constitutional: + both weight gain/loss, no fatigue, no subjective hyperthermia/hypothermia Eyes: no blurry vision, no xerophthalmia ENT: no sore throat, no nodules palpated in throat, no dysphagia/odynophagia, no hoarseness, + ear fullness Cardiovascular: + CP/no SOB/palpitations/leg swelling Respiratory: no cough/SOB Gastrointestinal: no N/V/D/C Musculoskeletal: no muscle/joint aches Skin: no rashes Neurological: no tremors/numbness/tingling/dizziness Psychiatric: + both depression/anxiety  Past Medical History  Diagnosis Date  . Type I (juvenile type) diabetes mellitus without mention of complication, not stated as uncontrolled   . PCOS (polycystic ovarian syndrome)   . History of chickenpox   . Anxiety   . Migraine headache    Past Surgical History  Procedure Laterality Date  . Appendectomy  1992  . Breast biopsy  2012    benign   History   Social History  . Marital Status: Single    Spouse Name: N/A    Number of Children: 60, 90 y/o   . Years of Education: 14   Occupational History  .  Preschool Teacher    Social History Main Topics  . Smoking status: Never Smoker   . Smokeless tobacco: Never Used  . Alcohol Use: No  . Drug Use: No  . Sexual Activity: Yes     Comment: lesbian relationship   Current Outpatient Prescriptions on File Prior to Visit  Medication Sig Dispense Refill  . amoxicillin-clavulanate (AUGMENTIN) 875-125 MG per tablet Take 1 tablet by mouth 2 (two) times daily.  20 tablet  0  . benzonatate (TESSALON) 100 MG capsule Take 1 capsule (100 mg total) by mouth 3 (three) times daily as needed for cough.  20 capsule  0  . chlorzoxazone (PARAFON) 500 MG tablet Take 1 tablet (500 mg total) by mouth 3 (three)  times daily as needed for muscle spasms.  30 tablet  0  . fish oil-omega-3 fatty acids 1000 MG capsule Take 1 g by mouth daily.        Marland Kitchen HYDROcodone-homatropine (HYCODAN) 5-1.5 MG/5ML syrup Take 5 mLs by mouth every 4 (four) hours as needed.  120 mL  0  . insulin aspart (NOVOLOG FLEXPEN) 100 UNIT/ML SOPN FlexPen INJECT UP TO 20 UNITS SUBCUTANEOUSLY 4 TIMES DAILY PER SLIDING SCALE AS DIRECTED  15 mL  2  . Insulin Glargine (LANTUS SOLOSTAR) 100 UNIT/ML SOPN INJECT UP TO 50 UNITS SUBCUTANEOUSLY AS DIRECTED  15 mL  2  . phentermine (ADIPEX-P) 37.5 MG tablet TAKE ONE TABLET BY MOUTH BEFORE BREAKFAST  30 tablet  1  . pravastatin (PRAVACHOL) 10 MG tablet Take 1 tablet (10 mg total) by mouth daily.  30 tablet  5  . topiramate (TOPAMAX) 100 MG tablet Take 1 tablet (100 mg total) by mouth 2 (two) times daily.  60 tablet  5   No current facility-administered medications on file prior to visit.   No Known Allergies Family History  Problem Relation Age of Onset  . Cancer Mother     Ovarian Cancer  . Heart disease Mother   . Heart disease Maternal Grandfather   . Diabetes Maternal Grandfather   . Diabetes Paternal Grandfather   . Cancer Other     Breast Cancer-Aunt  . Heart disease Maternal Grandmother 62    first MI at age 74; second at 23  . Cancer Paternal Grandmother 73    breast cancer  . Cancer Cousin 38    before age 72; double mastectomy; breast CA   PE: BP 116/70  Pulse 90  Temp(Src) 97.7 F (36.5 C) (Oral)  Resp 12  Wt 211 lb (95.709 kg)  SpO2 98% Wt Readings from Last 3 Encounters:  06/12/13 211 lb (95.709 kg)  03/27/13 212 lb (96.163 kg)  03/16/13 207 lb 6 oz (94.065 kg)   Constitutional: overweight, in NAD Eyes: PERRLA, EOMI, no exophthalmos, cleat TMs bilaterally ENT: moist mucous membranes, no thyromegaly, no cervical lymphadenopathy Cardiovascular: RRR, No MRG Respiratory: CTA B Gastrointestinal: abdomen soft, NT, ND, BS+ Musculoskeletal: no deformities, strength  intact in all 4 Skin: moist, warm, no rashes Neurological: + mild tremor with outstretched hands, DTR normal in all 4  ASSESSMENT: 1. DM1, uncontrolled, with complications - almost daily severe hypoglycemia >> had seizures  PLAN:  1. Patient with long-standing, uncontrolled DM1, with severe, frequent lows. Pt's doses of insulin have been progressively reduced until Lantus taken off. At this visit, we discussed to add back Lantus at a very low dose and also decrease the NovoLog with meals. - We discussed about changes to his insulin regimen, as follows:  Patient  Instructions  Please come back for labs only at 8 am for a HbA1c, cortisol, and ACTH level. Please change the insulin as follows: - Lantus 3 units in am - NovoLog: 3 units with breakfast 3 units with lunch if you eat lunch 3 units with dinner Please return in 1 month with your sugar log.   - advised not to skip lunch - discussed about an insulin pump and she is interested - will start paperwork when her new insurance starts - Strongly advised her to start checking sugars at different times of the day - check at least 4 times a day, rotating checks - given sugar log and advised how to fill it and to bring it at next appt  - given foot care handout and explained the principles  - given instructions for hypoglycemia management "15-15 rule"  - advised for yearly eye exams - advised to get ketone strips - advised to always have Glu tablets with her - she has a glucagon kit at home - advised for a Med-alert bracelet mentioning "type 1 diabetes mellitus". - at next visit, I will refer to DM education for further help with the pump: carb counting check, basal rate validation, extended bolusing, sick days rules, etc. - given instruction Re: exercising and driving in DM1 (pt instructions) - with her frequent severe lows, I would like to check a cortisol, ACTH along with a HbA1c in 2 weeks - Return to clinic in 1 mo with sugar  log   Pt's sugar in the office: 40s >> glu tabs >> 70 (per pt's meter). Given a can of soda, which she started before leaving.

## 2013-06-20 ENCOUNTER — Ambulatory Visit: Payer: BC Managed Care – PPO | Admitting: Internal Medicine

## 2013-06-20 ENCOUNTER — Other Ambulatory Visit: Payer: Self-pay | Admitting: Nurse Practitioner

## 2013-06-26 ENCOUNTER — Ambulatory Visit (INDEPENDENT_AMBULATORY_CARE_PROVIDER_SITE_OTHER): Payer: Medicaid Other | Admitting: Nurse Practitioner

## 2013-06-26 VITALS — BP 114/74 | Ht 62.0 in | Wt 208.0 lb

## 2013-06-26 DIAGNOSIS — R4589 Other symptoms and signs involving emotional state: Secondary | ICD-10-CM

## 2013-06-26 DIAGNOSIS — E109 Type 1 diabetes mellitus without complications: Secondary | ICD-10-CM

## 2013-06-26 DIAGNOSIS — J309 Allergic rhinitis, unspecified: Secondary | ICD-10-CM

## 2013-06-26 DIAGNOSIS — F430 Acute stress reaction: Secondary | ICD-10-CM

## 2013-06-26 DIAGNOSIS — J300 Vasomotor rhinitis: Secondary | ICD-10-CM

## 2013-06-26 DIAGNOSIS — F419 Anxiety disorder, unspecified: Secondary | ICD-10-CM | POA: Insufficient documentation

## 2013-06-26 DIAGNOSIS — F411 Generalized anxiety disorder: Secondary | ICD-10-CM

## 2013-06-26 MED ORDER — CITALOPRAM HYDROBROMIDE 20 MG PO TABS: ORAL_TABLET | ORAL | Status: DC

## 2013-06-26 MED ORDER — NASONEX 50 MCG/ACT NA SUSP: 2.0000 | Freq: Every day | NASAL | Status: DC

## 2013-06-26 MED ORDER — LORATADINE 10 MG PO TABS: 10.0000 mg | ORAL_TABLET | Freq: Every day | ORAL | Status: DC

## 2013-06-26 MED ORDER — ALPRAZOLAM 0.5 MG PO TABS: 0.5000 mg | ORAL_TABLET | Freq: Every evening | ORAL | Status: DC | PRN

## 2013-06-28 ENCOUNTER — Encounter: Payer: Self-pay | Admitting: Nurse Practitioner

## 2013-06-28 NOTE — Progress Notes (Signed)
Subjective:  Presents for complaints of anxiety symptoms over the past couple of weeks. Having relationship issues with her partner for over 15 years. Patient states she cries all the time. Difficulty sleeping. Fatigued. Denies suicidal thoughts or ideation. Also patient continues to have extremes in her blood sugars, saw the endocrinologist on 3/2. Has completed CNA school. Due to finances, [place] in going to [place]. Also complaints of mild head congestion with occasional cough. No fever. Clear drainage. Sinus pressure but no headache. No ear pain or sore throat.  Objective:   BP 114/74  Ht 5\' 2"  (1.575 m)  Wt 208 lb (94.348 kg)  BMI 38.03 kg/m2 NAD. Alert, oriented. Crying at times during visit. TMs clear effusion, no erythema. Pharynx mildly injected with clear PND noted. Neck supple with mild soft anterior adenopathy. Lungs clear. Heart regular rate rhythm.  Assessment: Problem List Items Addressed This Visit     Endocrine   Type I (juvenile type) diabetes mellitus without mention of complication, not stated as uncontrolled - Primary     Other   Anxiety as acute reaction to exceptional stress   Relevant Medications      citalopram (CELEXA) tablet      ALPRAZolam (XANAX) tablet    Other Visit Diagnoses   Vasomotor rhinitis          Plan:  Meds ordered this encounter  Medications  . citalopram (CELEXA) 20 MG tablet    Sig: 1/2 tab po qhs x 6 d then 1 po qhs    Dispense:  30 tablet    Refill:  0    Order Specific Question:  Supervising Provider    Answer:  Merlyn AlbertLUKING, WILLIAM S [[id number]]  . ALPRAZolam (XANAX) 0.5 MG tablet    Sig: Take 1 tablet (0.5 mg total) by mouth at bedtime as needed for anxiety.    Dispense:  30 tablet    Refill:  0    Order Specific Question:  Supervising Provider    Answer:  Merlyn AlbertLUKING, WILLIAM S [[id number]]  . loratadine (CLARITIN) 10 MG tablet    Sig: Take 1 tablet (10 mg total) by mouth daily.    Dispense:  30 tablet    Refill:  11    Order Specific  Question:  Supervising Provider    Answer:  Merlyn AlbertLUKING, WILLIAM S [[id number]]  . NASONEX 50 MCG/ACT nasal spray    Sig: Place 2 sprays into the nose daily.    Dispense:  17 g    Refill:  11    Dispense name brand per medicaid formulary    Order Specific Question:  Supervising Provider    Answer:  Riccardo DubinLUKING, WILLIAM S [[id number]]   Defers mental health counseling at this time due to schedule and finances. Strongly encouraged patient to continue followup with her endocrinologist due to the complexity of her diabetes. Has an appointment in April. Agree with her specialists that an insulin pump would probably be the best option, patient understands this would have to be done to specialist office. Return in about 1 month (around 07/27/2013). Call back sooner if any problems.

## 2013-07-12 LAB — HM DIABETES EYE EXAM

## 2013-07-13 ENCOUNTER — Other Ambulatory Visit: Payer: Self-pay | Admitting: *Deleted

## 2013-07-13 ENCOUNTER — Ambulatory Visit: Payer: Medicaid Other | Admitting: Internal Medicine

## 2013-07-13 DIAGNOSIS — E109 Type 1 diabetes mellitus without complications: Secondary | ICD-10-CM

## 2013-07-17 ENCOUNTER — Other Ambulatory Visit: Payer: Self-pay | Admitting: Nurse Practitioner

## 2013-07-19 ENCOUNTER — Encounter: Payer: Self-pay | Admitting: Nurse Practitioner

## 2013-07-19 ENCOUNTER — Ambulatory Visit (INDEPENDENT_AMBULATORY_CARE_PROVIDER_SITE_OTHER): Payer: Medicaid Other | Admitting: Nurse Practitioner

## 2013-07-19 VITALS — BP 112/78 | HR 70 | Ht 63.0 in | Wt 208.0 lb

## 2013-07-19 DIAGNOSIS — G43909 Migraine, unspecified, not intractable, without status migrainosus: Secondary | ICD-10-CM

## 2013-07-19 DIAGNOSIS — F411 Generalized anxiety disorder: Secondary | ICD-10-CM

## 2013-07-19 DIAGNOSIS — Z23 Encounter for immunization: Secondary | ICD-10-CM

## 2013-07-19 DIAGNOSIS — F430 Acute stress reaction: Secondary | ICD-10-CM

## 2013-07-19 DIAGNOSIS — R4589 Other symptoms and signs involving emotional state: Secondary | ICD-10-CM

## 2013-07-19 NOTE — Patient Instructions (Addendum)
Nexplanon Where to get insulin pump and supplies? Need a prescription Take that to medical supply [facility] Then will send paperwork to your specialist

## 2013-07-20 ENCOUNTER — Encounter: Payer: Self-pay | Admitting: Nurse Practitioner

## 2013-07-20 NOTE — Progress Notes (Signed)
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 Gets regular preventive health physicals with her gynecologist. Is still working on getting insulin pump through her endocrinologist. Rare migraine. Anxiety much improved. Relationship of her long-term partner is better.  Objective:   BP 112/78  Pulse 70  Ht 5\' 3"  (1.6 m)  Wt 208 lb (94.348 kg)  BMI 36.85 kg/m2  LMP 06/25/2013 NAD. Alert, oriented. Lungs clear. Heart regular rate rhythm.  Assessment:Migraines stable  Anxiety as acute reaction to exceptional stress; status improving  Need for prophylactic vaccination with combined diphtheria-tetanus-pertussis (DTP) vaccine - Plan: Tdap vaccine greater than or equal to 7yo IM  Plan: Encouraged continue followup with endocrinologist, discussed ways to get her insulin pump through Medicaid. Routine followup here as planned. Call back sooner if any problems.

## 2013-07-27 ENCOUNTER — Ambulatory Visit (INDEPENDENT_AMBULATORY_CARE_PROVIDER_SITE_OTHER): Payer: Medicaid Other | Admitting: Nurse Practitioner

## 2013-07-27 ENCOUNTER — Encounter: Payer: Self-pay | Admitting: Nurse Practitioner

## 2013-07-27 VITALS — BP 118/80 | Ht 63.0 in | Wt 212.0 lb

## 2013-07-27 DIAGNOSIS — F411 Generalized anxiety disorder: Secondary | ICD-10-CM

## 2013-07-27 DIAGNOSIS — F430 Acute stress reaction: Principal | ICD-10-CM

## 2013-07-27 DIAGNOSIS — R4589 Other symptoms and signs involving emotional state: Secondary | ICD-10-CM

## 2013-07-27 MED ORDER — CITALOPRAM HYDROBROMIDE 20 MG PO TABS: ORAL_TABLET | ORAL | Status: DC

## 2013-07-28 ENCOUNTER — Encounter: Payer: Self-pay | Admitting: Nurse Practitioner

## 2013-07-28 NOTE — Progress Notes (Signed)
Subjective:  Presents for recheck. Doing betterl on Celexa. Sleeping better. Relationship issues have improved. Denies any adverse effects. Has appt for follow up with endocrinologist.  Objective:   BP 118/80  Ht 5\' 3"  (1.6 m)  Wt 212 lb (96.163 kg)  BMI 37.56 kg/m2  LMP 06/25/2013 NAD. Alert, oriented. Lungs clear. Heart RRR.   Assessment:  Problem List Items Addressed This Visit     Other   Anxiety - Primary   Relevant Medications      citalopram (CELEXA) tablet     Plan:  Meds ordered this encounter  Medications  . citalopram (CELEXA) 20 MG tablet    Sig: 1 1/2 po qd    Dispense:  45 tablet    Refill:  2    Order Specific Question:  Supervising Provider    Answer:  Merlyn AlbertLUKING, WILLIAM S [[id number]]   Increase Celexa to 1 1/2 tabs. May increase to 2 tabs in 2 weeks if needed; call back to office at that time.  Return in about 3 months (around 10/26/2013). Sooner if problems.

## 2013-08-21 ENCOUNTER — Other Ambulatory Visit: Payer: Self-pay | Admitting: Nurse Practitioner

## 2013-08-21 MED ORDER — ALPRAZOLAM 0.5 MG PO TABS: 0.5000 mg | ORAL_TABLET | Freq: Every evening | ORAL | Status: DC | PRN

## 2013-10-10 ENCOUNTER — Encounter: Payer: Self-pay | Admitting: Nurse Practitioner

## 2013-10-10 ENCOUNTER — Ambulatory Visit (INDEPENDENT_AMBULATORY_CARE_PROVIDER_SITE_OTHER): Payer: Medicaid Other | Admitting: Nurse Practitioner

## 2013-10-10 VITALS — BP 134/80 | Temp 98.9°F | Ht 62.0 in | Wt 209.0 lb

## 2013-10-10 DIAGNOSIS — L02219 Cutaneous abscess of trunk, unspecified: Secondary | ICD-10-CM

## 2013-10-10 DIAGNOSIS — W57XXXA Bitten or stung by nonvenomous insect and other nonvenomous arthropods, initial encounter: Secondary | ICD-10-CM

## 2013-10-10 DIAGNOSIS — L03319 Cellulitis of trunk, unspecified: Secondary | ICD-10-CM

## 2013-10-10 DIAGNOSIS — Z113 Encounter for screening for infections with a predominantly sexual mode of transmission: Secondary | ICD-10-CM

## 2013-10-10 DIAGNOSIS — L03311 Cellulitis of abdominal wall: Secondary | ICD-10-CM

## 2013-10-10 DIAGNOSIS — S30861A Insect bite (nonvenomous) of abdominal wall, initial encounter: Secondary | ICD-10-CM

## 2013-10-10 DIAGNOSIS — S30860A Insect bite (nonvenomous) of lower back and pelvis, initial encounter: Secondary | ICD-10-CM

## 2013-10-10 MED ORDER — CEPHALEXIN 500 MG PO CAPS: 500.0000 mg | ORAL_CAPSULE | Freq: Three times a day (TID) | ORAL | Status: DC

## 2013-10-11 LAB — RPR

## 2013-10-11 LAB — HIV ANTIBODY (ROUTINE TESTING W REFLEX): HIV: NONREACTIVE

## 2013-10-11 LAB — GC/CHLAMYDIA PROBE AMP, URINE
Chlamydia, Swab/Urine, PCR: NEGATIVE
GC PROBE AMP, URINE: NEGATIVE

## 2013-10-11 LAB — HEPATITIS C ANTIBODY: HCV AB: NEGATIVE

## 2013-10-14 ENCOUNTER — Encounter: Payer: Self-pay | Admitting: Nurse Practitioner

## 2013-10-14 NOTE — Progress Notes (Signed)
Subjective:  Presents requesting STD testing. Currently in a lesbian relationship. States her partner has been cheating on her. Has had one sexual encounter with her since then. No other partners. No genital rash or lesions, no vaginal discharge. No pelvic pain. No fever. Also tick bite on the lower abdomen about 3 weeks ago. No headache or other rash. Some difficulty healing, patient did go swimming upon after this. Note patient has type 1 diabetes.  Objective:   BP 134/80  Temp(Src) 98.9 F (37.2 C) (Oral)  Ht 5\' 2"  (1.575 m)  Wt 209 lb (94.802 kg)  BMI 38.22 kg/m2 NAD. Alert, oriented. Lungs clear. Heart regular rate rhythm. Small lesion noted on the right lower abdominal wall with minimal erythema and a slight open area with dried yellowish material. Minimal tenderness to palpation. Defers GU exam.  Assessment: Cellulitis of abdominal wall  Tick bite of abdomen, initial encounter  Screen for STD (sexually transmitted disease) - Plan: HIV antibody, Hepatitis C antibody, RPR, GC/chlamydia probe amp, urine  Plan:  Meds ordered this encounter  Medications  . cephALEXin (KEFLEX) 500 MG capsule    Sig: Take 1 capsule (500 mg total) by mouth 3 (three) times daily.    Dispense:  21 capsule    Refill:  0    Order Specific Question:  Supervising Provider    Answer:  Merlyn AlbertLUKING, WILLIAM S [[id number]]   Warm compresses to abdominal lesion. Call back next week if not significantly improved. Reviewed safe sex issues. STD testing pending.

## 2013-10-26 ENCOUNTER — Encounter: Payer: Self-pay | Admitting: Nurse Practitioner

## 2013-10-26 ENCOUNTER — Other Ambulatory Visit: Payer: Self-pay | Admitting: *Deleted

## 2013-10-26 ENCOUNTER — Ambulatory Visit (INDEPENDENT_AMBULATORY_CARE_PROVIDER_SITE_OTHER): Payer: Medicaid Other | Admitting: Nurse Practitioner

## 2013-10-26 VITALS — BP 124/72 | Ht 62.0 in | Wt 208.0 lb

## 2013-10-26 DIAGNOSIS — E109 Type 1 diabetes mellitus without complications: Secondary | ICD-10-CM

## 2013-10-26 DIAGNOSIS — F411 Generalized anxiety disorder: Secondary | ICD-10-CM

## 2013-10-26 DIAGNOSIS — E785 Hyperlipidemia, unspecified: Secondary | ICD-10-CM

## 2013-10-26 DIAGNOSIS — E108 Type 1 diabetes mellitus with unspecified complications: Secondary | ICD-10-CM

## 2013-10-26 DIAGNOSIS — F419 Anxiety disorder, unspecified: Secondary | ICD-10-CM

## 2013-10-26 DIAGNOSIS — G43009 Migraine without aura, not intractable, without status migrainosus: Secondary | ICD-10-CM

## 2013-10-26 LAB — HEMOGLOBIN A1C
HEMOGLOBIN A1C: 7.2 % — AB (ref <5.7)
MEAN PLASMA GLUCOSE: 160 mg/dL — AB (ref <117)

## 2013-10-26 LAB — CORTISOL: CORTISOL PLASMA: 24.4 ug/dL

## 2013-10-26 MED ORDER — ALPRAZOLAM 0.5 MG PO TABS: 0.5000 mg | ORAL_TABLET | Freq: Every evening | ORAL | Status: DC | PRN

## 2013-10-26 MED ORDER — PRAVASTATIN SODIUM 10 MG PO TABS: 10.0000 mg | ORAL_TABLET | Freq: Every day | ORAL | Status: DC

## 2013-10-26 MED ORDER — INSULIN ASPART 100 UNIT/ML FLEXPEN: PEN_INJECTOR | SUBCUTANEOUS | Status: DC

## 2013-10-26 MED ORDER — CITALOPRAM HYDROBROMIDE 20 MG PO TABS: ORAL_TABLET | ORAL | Status: DC

## 2013-10-26 MED ORDER — TOPIRAMATE 100 MG PO TABS: 100.0000 mg | ORAL_TABLET | Freq: Two times a day (BID) | ORAL | Status: DC

## 2013-10-27 ENCOUNTER — Encounter: Payer: Self-pay | Admitting: Nurse Practitioner

## 2013-10-27 NOTE — Progress Notes (Signed)
Subjective:  Presents for routine followup. Doing much better with her weight. Staying active. Blood sugars are much improved. Has not seen her endocrinologist since March. Has been seen gynecologist. Is back with her long-term partner with another partner now living with him. Is considering conception. Has not done her labs were previously ordered by specialist. No chest pain shortness of breath or edema. Migraines stable.  Objective:   BP 124/72  Ht 5\' 2"  (1.575 m)  Wt 208 lb (94.348 kg)  BMI 38.03 kg/m2 NAD. Alert, oriented. Lungs clear. Heart regular rate rhythm. Hemoglobin A1c pending on lab work.   Assessment: Type I (juvenile type) diabetes mellitus without mention of complication, not stated as uncontrolled  Anxiety  Hyperlipemia  Migraine without aura and without status migrainosus, not intractable  Plan:  Meds ordered this encounter  Medications  . pravastatin (PRAVACHOL) 10 MG tablet    Sig: Take 1 tablet (10 mg total) by mouth daily.    Dispense:  30 tablet    Refill:  5    Order Specific Question:  Supervising Provider    Answer:  Merlyn AlbertLUKING, WILLIAM S [[id number]]  . topiramate (TOPAMAX) 100 MG tablet    Sig: Take 1 tablet (100 mg total) by mouth 2 (two) times daily.    Dispense:  60 tablet    Refill:  5    Order Specific Question:  Supervising Provider    Answer:  Merlyn AlbertLUKING, WILLIAM S [[id number]]  . insulin aspart (NOVOLOG FLEXPEN) 100 UNIT/ML FlexPen    Sig: INJECT UP TO 20 UNITS SUBCUTANEOUSLY FOUR TIMES DAILY PER SLIDING SCALE AS DIRECTED    Dispense:  5 pen    Refill:  2    Order Specific Question:  Supervising Provider    Answer:  Merlyn AlbertLUKING, WILLIAM S [[id number]]  . citalopram (CELEXA) 20 MG tablet    Sig: 1 1/2 po qd    Dispense:  45 tablet    Refill:  2    Order Specific Question:  Supervising Provider    Answer:  Merlyn AlbertLUKING, WILLIAM S [[id number]]  . ALPRAZolam (XANAX) 0.5 MG tablet    Sig: Take 1 tablet (0.5 mg total) by mouth at bedtime as needed for anxiety.    Dispense:  30  tablet    Refill:  2    Order Specific Question:  Supervising Provider    Answer:  Merlyn AlbertLUKING, WILLIAM S [[id number]]   Has are discussed medications with gynecologist. Patient understands she must stop Topamax and Xanax before pregnancy. Strongly encouraged office visit with her endocrinologist. Also patient to get lab work done this morning. Return in about 3 months (around 01/26/2014).

## 2013-10-30 LAB — ACTH: C206 ACTH: 47 pg/mL (ref 6–50)

## 2013-11-01 ENCOUNTER — Ambulatory Visit (INDEPENDENT_AMBULATORY_CARE_PROVIDER_SITE_OTHER): Payer: Medicaid Other | Admitting: Nurse Practitioner

## 2013-11-01 ENCOUNTER — Ambulatory Visit (HOSPITAL_COMMUNITY)
Admission: RE | Admit: 2013-11-01 | Discharge: 2013-11-01 | Disposition: A | Discharge status: Home / Self Care | Payer: Medicaid Other | Source: Ambulatory Visit | Attending: Nurse Practitioner | Admitting: Nurse Practitioner

## 2013-11-01 ENCOUNTER — Encounter: Payer: Self-pay | Admitting: Nurse Practitioner

## 2013-11-01 VITALS — BP 124/80 | Resp 18 | Ht 62.0 in | Wt 204.0 lb

## 2013-11-01 DIAGNOSIS — M25519 Pain in unspecified shoulder: Secondary | ICD-10-CM | POA: Insufficient documentation

## 2013-11-01 DIAGNOSIS — M25511 Pain in right shoulder: Secondary | ICD-10-CM

## 2013-11-01 MED ORDER — HYDROCODONE-ACETAMINOPHEN 10-325 MG PO TABS: 1.0000 | ORAL_TABLET | Freq: Four times a day (QID) | ORAL | Status: DC | PRN

## 2013-11-01 MED ORDER — IBUPROFEN 800 MG PO TABS: 800.0000 mg | ORAL_TABLET | Freq: Three times a day (TID) | ORAL | Status: DC | PRN

## 2013-11-01 MED ORDER — CHLORZOXAZONE 500 MG PO TABS: 500.0000 mg | ORAL_TABLET | Freq: Three times a day (TID) | ORAL | Status: DC | PRN

## 2013-11-06 ENCOUNTER — Encounter: Payer: Self-pay | Admitting: Nurse Practitioner

## 2013-11-06 NOTE — Progress Notes (Signed)
Subjective:  Presents for diffuse right shoulder area pain after injury earlier this morning while walking her dog. Stated the dog pulled the leash and patient's arm was pulled forcefully.  Objective:   BP 124/80  Resp 18  Ht 5\' 2"  (1.575 m)  Wt 204 lb (92.534 kg)  BMI 37.30 kg/m2  LMP 09/25/2013 NAD. Alert, oriented. Diffuse tenderness and tight muscles noted along the upper back and upper anterior chest wall. Also tenderness in the shoulder joint line into the upper arm. Limited ROM above shoulder height due to tenderness. Hand strength 5+ bilateral. Pulses strong. Sensation grossly intact.  Assessment: Right shoulder pain/strain - Plan: DG Shoulder Right, DG Shoulder Right  Plan: Meds ordered this encounter  Medications  . ibuprofen (ADVIL,MOTRIN) 800 MG tablet    Sig: Take 1 tablet (800 mg total) by mouth every 8 (eight) hours as needed.    Dispense:  30 tablet    Refill:  0    Order Specific Question:  Supervising Provider    Answer:  Merlyn AlbertLUKING, WILLIAM S [[id number]]  . chlorzoxazone (PARAFON) 500 MG tablet    Sig: Take 1 tablet (500 mg total) by mouth 3 (three) times daily as needed for muscle spasms.    Dispense:  30 tablet    Refill:  0    Order Specific Question:  Supervising Provider    Answer:  Merlyn AlbertLUKING, WILLIAM S [[id number]]  . HYDROcodone-acetaminophen (NORCO) 10-325 MG per tablet    Sig: Take 1 tablet by mouth every 6 (six) hours as needed.    Dispense:  30 tablet    Refill:  0    Order Specific Question:  Supervising Provider    Answer:  Riccardo DubinLUKING, WILLIAM S [[id number]]   Ice/heat applications. Stretching and shoulder exercises. Expect gradual improvement over the next 3-4 days, call back if no improvement.

## 2013-11-12 ENCOUNTER — Other Ambulatory Visit: Payer: Self-pay | Admitting: Nurse Practitioner

## 2013-11-13 ENCOUNTER — Telehealth: Payer: Self-pay | Admitting: Family Medicine

## 2013-11-13 ENCOUNTER — Other Ambulatory Visit: Payer: Self-pay | Admitting: *Deleted

## 2013-11-13 MED ORDER — INSULIN ASPART 100 UNIT/ML FLEXPEN: PEN_INJECTOR | SUBCUTANEOUS | Status: DC

## 2013-11-13 NOTE — Telephone Encounter (Signed)
[facility] calling to say [facility] test  Strips which i see we have an order in for her.   She wanted us to know she is completley out

## 2013-11-13 NOTE — Telephone Encounter (Signed)
Rx sent electronically to pharmacy. 

## 2013-11-17 ENCOUNTER — Encounter (HOSPITAL_COMMUNITY): Payer: Self-pay | Admitting: Emergency Medicine

## 2013-11-17 ENCOUNTER — Emergency Department (HOSPITAL_COMMUNITY)
Admission: EM | Admit: 2013-11-17 | Discharge: 2013-11-17 | Disposition: A | Discharge status: Home / Self Care | Payer: Medicaid Other | Attending: Emergency Medicine | Admitting: Emergency Medicine

## 2013-11-17 DIAGNOSIS — Z79899 Other long term (current) drug therapy: Secondary | ICD-10-CM | POA: Insufficient documentation

## 2013-11-17 DIAGNOSIS — T63484A Toxic effect of venom of other arthropod, undetermined, initial encounter: Secondary | ICD-10-CM

## 2013-11-17 DIAGNOSIS — T63461A Toxic effect of venom of wasps, accidental (unintentional), initial encounter: Secondary | ICD-10-CM | POA: Diagnosis not present

## 2013-11-17 DIAGNOSIS — G43909 Migraine, unspecified, not intractable, without status migrainosus: Secondary | ICD-10-CM | POA: Diagnosis not present

## 2013-11-17 DIAGNOSIS — E109 Type 1 diabetes mellitus without complications: Secondary | ICD-10-CM | POA: Insufficient documentation

## 2013-11-17 DIAGNOSIS — Z791 Long term (current) use of non-steroidal anti-inflammatories (NSAID): Secondary | ICD-10-CM | POA: Insufficient documentation

## 2013-11-17 DIAGNOSIS — F411 Generalized anxiety disorder: Secondary | ICD-10-CM | POA: Insufficient documentation

## 2013-11-17 DIAGNOSIS — Z8619 Personal history of other infectious and parasitic diseases: Secondary | ICD-10-CM | POA: Diagnosis not present

## 2013-11-17 DIAGNOSIS — Y939 Activity, unspecified: Secondary | ICD-10-CM | POA: Diagnosis not present

## 2013-11-17 DIAGNOSIS — Z794 Long term (current) use of insulin: Secondary | ICD-10-CM | POA: Insufficient documentation

## 2013-11-17 DIAGNOSIS — T6391XA Toxic effect of contact with unspecified venomous animal, accidental (unintentional), initial encounter: Secondary | ICD-10-CM | POA: Diagnosis present

## 2013-11-17 DIAGNOSIS — R509 Fever, unspecified: Secondary | ICD-10-CM | POA: Diagnosis not present

## 2013-11-17 DIAGNOSIS — Y929 Unspecified place or not applicable: Secondary | ICD-10-CM | POA: Insufficient documentation

## 2013-11-17 DIAGNOSIS — R51 Headache: Secondary | ICD-10-CM | POA: Insufficient documentation

## 2013-11-17 MED ORDER — DOXYCYCLINE HYCLATE 100 MG PO TABS
100.0000 mg | ORAL_TABLET | Freq: Once | ORAL | Status: AC
  Administered 2013-11-17: 100 mg via ORAL
  Filled 2013-11-17: qty 1

## 2013-11-17 MED ORDER — DOXYCYCLINE HYCLATE 100 MG PO CAPS: 100.0000 mg | ORAL_CAPSULE | Freq: Two times a day (BID) | ORAL | Status: DC

## 2013-11-17 NOTE — ED Notes (Addendum)
Pt. Reports being stung by insect on right arm yesterday. Pt. Reports increased swelling and redness to surrounding area. Reddened area hot to touch.

## 2013-11-17 NOTE — ED Provider Notes (Signed)
CSN: [id number]     Arrival date & time 11/17/13  2017 History   First MD Initiated Contact with Patient 11/17/13 2039     Chief Complaint  Patient presents with  . Insect Bite     (Consider location/radiation/quality/duration/timing/severity/associated sxs/prior Treatment) HPI Comments: Patient is a 33 year old female who presents to the emergency department with complaint of swelling and itching of the right arm she sustained a bee sting on yesterday. The patient states she was stung twice on the right arm. She had to take the stinger out of one of the areas. She noticed temperature elevation today of 101 point. She took ibuprofen and the temperature came down to 99.3. She complains of itching at the site of the stings. There's been no hives reported. There's been no difficulty with breathing. There's been no swelling of the face or tongue. It is of note that the patient is a type I diabetic.  The history is provided by the patient.    Past Medical History  Diagnosis Date  . Type I (juvenile type) diabetes mellitus without mention of complication, not stated as uncontrolled   . PCOS (polycystic ovarian syndrome)   . History of chickenpox   . Anxiety   . Migraine headache    Past Surgical History  Procedure Laterality Date  . Appendectomy  1992  . Breast biopsy  2012    benign   Family History  Problem Relation Age of Onset  . Cancer Mother     Ovarian Cancer  . Heart disease Mother   . Heart disease Maternal Grandfather   . Diabetes Maternal Grandfather   . Diabetes Paternal Grandfather   . Cancer Other     Breast Cancer-Aunt  . Heart disease Maternal Grandmother 28    first MI at age 14; second at 55  . Cancer Paternal Grandmother 83    breast cancer  . Cancer Cousin 66    before age 70; double mastectomy; breast CA   History  Substance Use Topics  . Smoking status: Never Smoker   . Smokeless tobacco: Never Used  . Alcohol Use: No   OB History   Grav Para Term  Preterm Abortions TAB SAB Ect Mult Living                 Review of Systems  Constitutional: Negative for activity change.       All ROS Neg except as noted in HPI  HENT: Negative for nosebleeds.   Eyes: Negative for photophobia and discharge.  Respiratory: Negative for cough, shortness of breath and wheezing.   Cardiovascular: Negative for chest pain and palpitations.  Gastrointestinal: Negative for abdominal pain and blood in stool.  Genitourinary: Negative for dysuria, frequency and hematuria.  Musculoskeletal: Negative for arthralgias, back pain and neck pain.  Skin: Negative.   Neurological: Positive for headaches. Negative for dizziness, seizures and speech difficulty.  Psychiatric/Behavioral: Negative for hallucinations and confusion. The patient is nervous/anxious.       Allergies  Review of patient's allergies indicates no known allergies.  Home Medications   Prior to Admission medications   Medication Sig Start Date End Date Taking? Authorizing Provider  ACCU-CHEK AVIVA PLUS test strip USE ONE STRIP TO CHECK GLUCOSE UP TO 7 TIMES DAILY    Campbell Riches, NP  ALPRAZolam Prudy Feeler) 0.5 MG tablet Take 1 tablet (0.5 mg total) by mouth at bedtime as needed for anxiety. 10/26/13   Campbell Riches, NP  chlorzoxazone (PARAFON) 500 MG tablet Take  1 tablet (500 mg total) by mouth 3 (three) times daily as needed for muscle spasms. 11/01/13   Campbell Richesarolyn C Hoskins, NP  citalopram (CELEXA) 20 MG tablet 1 1/2 po qd 10/26/13   Campbell Richesarolyn C Hoskins, NP  fish oil-omega-3 fatty acids 1000 MG capsule Take 1 g by mouth daily.      Historical Provider, MD  HYDROcodone-acetaminophen (NORCO) 10-325 MG per tablet Take 1 tablet by mouth every 6 (six) hours as needed. 11/01/13   Campbell Richesarolyn C Hoskins, NP  ibuprofen (ADVIL,MOTRIN) 800 MG tablet Take 1 tablet (800 mg total) by mouth every 8 (eight) hours as needed. 11/01/13   Campbell Richesarolyn C Hoskins, NP  insulin aspart (NOVOLOG FLEXPEN) 100 UNIT/ML FlexPen INJECT UP TO  20 UNITS SUBCUTANEOUSLY FOUR TIMES DAILY PER SLIDING SCALE AS DIRECTED 11/13/13   Merlyn AlbertWilliam S Luking, MD  Insulin Glargine (LANTUS SOLOSTAR) 100 UNIT/ML SOPN INJECT UP TO 50 UNITS SUBCUTANEOUSLY AS DIRECTED 03/27/13   Campbell Richesarolyn C Hoskins, NP  loratadine (CLARITIN) 10 MG tablet Take 1 tablet (10 mg total) by mouth daily. 06/26/13   Campbell Richesarolyn C Hoskins, NP  NASONEX 50 MCG/ACT nasal spray Place 2 sprays into the nose daily. 06/26/13 06/26/14  Campbell Richesarolyn C Hoskins, NP  pravastatin (PRAVACHOL) 10 MG tablet Take 1 tablet (10 mg total) by mouth daily. 10/26/13   Campbell Richesarolyn C Hoskins, NP  topiramate (TOPAMAX) 100 MG tablet Take 1 tablet (100 mg total) by mouth 2 (two) times daily. 10/26/13   Campbell Richesarolyn C Hoskins, NP   BP 126/67  Pulse 89  Temp(Src) 99.3 F (37.4 C) (Oral)  Resp 20  Ht 5\' 3"  (1.6 m)  Wt 204 lb (92.534 kg)  BMI 36.15 kg/m2  SpO2 100%  LMP 11/16/2013 Physical Exam  Nursing note and vitals reviewed. Constitutional: She is oriented to person, place, and time. She appears well-developed and well-nourished.  Non-toxic appearance.  HENT:  Head: Normocephalic.  Right Ear: Tympanic membrane and external ear normal.  Left Ear: Tympanic membrane and external ear normal.  Eyes: EOM and lids are normal. Pupils are equal, round, and reactive to light.  Neck: Normal range of motion. Neck supple. Carotid bruit is not present.  Cardiovascular: Normal rate, regular rhythm, normal heart sounds, intact distal pulses and normal pulses.   Pulmonary/Chest: Breath sounds normal. No respiratory distress. She has no wheezes.  Abdominal: Soft. Bowel sounds are normal. There is no tenderness. There is no guarding.  Musculoskeletal: Normal range of motion.  There is increased redness and some swelling of the lower bicep area of the right arm. The area is warm to touch. There is no red streaking appreciated.  There is nearly complete circumferential redness of the upper forearm just below the sting site. The area is warm to  touch. There is no red streaking appreciated.  There is full range of motion of the right shoulder, elbow, wrist, and fingers. Capillary refill is less than 2 seconds.  Lymphadenopathy:       Head (right side): No submandibular adenopathy present.       Head (left side): No submandibular adenopathy present.    She has no cervical adenopathy.  Neurological: She is alert and oriented to person, place, and time. She has normal strength. No cranial nerve deficit or sensory deficit.  Skin: Skin is warm and dry.  Psychiatric: She has a normal mood and affect. Her speech is normal.    ED Course  Procedures (including critical care time) Labs Review Labs Reviewed - No data to display  Imaging Review No results found.   EKG Interpretation None      MDM Patient sustained 2 stings to the right arm, followed by temperature elevation. No reported upper respiratory infection symptoms. No unusual cough or shortness of breath. No dysuria, hematuria, or major changes in urine patterns. She has a history of type 1 diabetes mellitus. The patient will be treated with doxycycline 2 times daily. I've asked the patient to use Allegra during the day for itching, and Benadryl at bedtime if needed for itching. I've also encouraged patient to use Benadryl  ointment to the itching areas. Tylenol every 4 hours or ibuprofen every 6 hours have been encouraged to be used for temperature elevation.    Final diagnoses:  None    *I have reviewed nursing notes, vital signs, and all appropriate lab and imaging results for this patient.Kathie Dike, PA-C 11/17/13 2122

## 2013-11-17 NOTE — ED Notes (Signed)
Bee sting yesterday, increasing swelling and itching to right arm.  Temp at home 101.8  Took Advil at 1830.  Temp 99.3 orally now.   .Marland Kitchen

## 2013-11-17 NOTE — ED Provider Notes (Signed)
Medical screening examination/treatment/procedure(s) were performed by non-physician practitioner and as supervising physician I was immediately available for consultation/collaboration.  Flint MelterElliott L Maurisa Tesmer, MD 11/17/13 2141

## 2013-11-17 NOTE — Discharge Instructions (Signed)
Cool compresses to your right arm maybe helpful. Please use doxycycline 2 times daily with food. Please apply Benadryl ointment to the itching areas when needed. Use Allegra during the day or when driving or operating machinery. Use Benadryl at bedtime if needed for itching. Please return to the emergency department immediately if any difficulty with breathing, facial swelling, mild swelling, temperature that would not respond to Tylenol ibuprofen, or deterioration in her general condition.

## 2013-12-20 ENCOUNTER — Ambulatory Visit: Payer: Medicaid Other

## 2014-01-05 ENCOUNTER — Encounter: Payer: Self-pay | Admitting: Family Medicine

## 2014-01-05 ENCOUNTER — Encounter: Payer: Self-pay | Admitting: Nurse Practitioner

## 2014-01-05 ENCOUNTER — Ambulatory Visit (INDEPENDENT_AMBULATORY_CARE_PROVIDER_SITE_OTHER): Payer: Medicaid Other | Admitting: Nurse Practitioner

## 2014-01-05 VITALS — BP 120/80 | Temp 98.7°F | Ht 62.0 in | Wt 203.0 lb

## 2014-01-05 DIAGNOSIS — R21 Rash and other nonspecific skin eruption: Secondary | ICD-10-CM

## 2014-01-05 MED ORDER — TRIAMCINOLONE ACETONIDE 0.1 % EX CREA: 1.0000 "application " | TOPICAL_CREAM | Freq: Two times a day (BID) | CUTANEOUS | Status: DC

## 2014-01-05 MED ORDER — DOXYCYCLINE HYCLATE 100 MG PO CAPS: 100.0000 mg | ORAL_CAPSULE | Freq: Two times a day (BID) | ORAL | Status: DC

## 2014-01-07 ENCOUNTER — Encounter: Payer: Self-pay | Admitting: Nurse Practitioner

## 2014-01-07 NOTE — Progress Notes (Signed)
Subjective:  Presents complaints of a rash on the top of her left foot along tattoo that she received recently. According to patient her tetanus shot is up-to-date. No fever. Mildly pruritic. Minimally tender. Patient relates rash to hitting the top of her foot with a screen door.  Objective:   BP 120/80  Temp(Src) 98.7 F (37.1 C) (Oral)  Ht  (1.575 m)  Wt 203 lb (92.08 kg)  BMI 37.12 kg/m2 NAD. Alert, oriented. Multiple slightly raised pink papules noted on the top of the left foot; all of the lesions are long lines of her tattoo. Some excoriation is noted. Minimally erythematous. Nontender. No edema. No drainage. No signs of an abrasion or ecchymoses noted.  Assessment: Rash and nonspecific skin eruption  Plan:  Meds ordered this encounter  Medications  . doxycycline (VIBRAMYCIN) 100 MG capsule    Sig: Take 1 capsule (100 mg total) by mouth 2 (two) times daily.    Dispense:  20 capsule    Refill:  0    Order Specific Question:  Supervising Provider    Answer:  Merlyn Albert [[id number]]  . triamcinolone cream (KENALOG) 0.1 %    Sig: Apply 1 application topically 2 (two) times daily. Prn rash; use up to 2 weeks    Dispense:  30 g    Refill:  0    Order Specific Question:  Supervising Provider    Answer:  Merlyn Albert [[id number]]   Callback in 7-10 days if rash has not resolved, sooner if worse.

## 2014-01-08 ENCOUNTER — Telehealth: Payer: Self-pay | Admitting: Nurse Practitioner

## 2014-01-08 MED ORDER — CEFPROZIL 500 MG PO TABS: 500.0000 mg | ORAL_TABLET | Freq: Two times a day (BID) | ORAL | Status: DC

## 2014-01-08 NOTE — Telephone Encounter (Signed)
Notified patient may add Cefzil 500 mg one twice a day for 7 days. Also if not improving over the next 24 hours if having continued fever then highly recommend being seen. Patient verbalized understanding.

## 2014-01-08 NOTE — Telephone Encounter (Signed)
May add Cefzil 500 mg one twice a day for 7 days. Also if not improving over the next 24 hours if having continued fever then I highly recommend being seen. Also the patient would prefer to be seen we could see her this evening.

## 2014-01-08 NOTE — Telephone Encounter (Signed)
Patient states she has a fever 102, ear pain, green congestion and sore throat. No other symptoms.

## 2014-01-08 NOTE — Telephone Encounter (Signed)
Pt called back to say she is not vomiting

## 2014-01-08 NOTE — Telephone Encounter (Signed)
Patient said that she had mentioned that she felt like she had a cold last Friday to [place].  She said Eber Jones told her to call her if it got worse. She now has a fever of 102, sore throat, green mucous, ear pain. Also, needs work excuse. [employer] [place]

## 2014-01-11 ENCOUNTER — Other Ambulatory Visit: Payer: Self-pay | Admitting: Nurse Practitioner

## 2014-01-11 MED ORDER — INSULIN ASPART 100 UNIT/ML FLEXPEN: PEN_INJECTOR | SUBCUTANEOUS | Status: DC

## 2014-01-20 ENCOUNTER — Other Ambulatory Visit: Payer: Self-pay | Admitting: Nurse Practitioner

## 2014-02-28 ENCOUNTER — Encounter: Payer: Self-pay | Admitting: Family Medicine

## 2014-02-28 ENCOUNTER — Ambulatory Visit (INDEPENDENT_AMBULATORY_CARE_PROVIDER_SITE_OTHER): Payer: Medicaid Other | Admitting: Family Medicine

## 2014-02-28 VITALS — BP 112/64 | Temp 98.2°F | Ht 62.0 in | Wt 211.0 lb

## 2014-02-28 DIAGNOSIS — J0100 Acute maxillary sinusitis, unspecified: Secondary | ICD-10-CM

## 2014-02-28 MED ORDER — FLUCONAZOLE 150 MG PO TABS: ORAL_TABLET | ORAL | Status: DC

## 2014-02-28 MED ORDER — AMOXICILLIN-POT CLAVULANATE 875-125 MG PO TABS: 1.0000 | ORAL_TABLET | Freq: Two times a day (BID) | ORAL | Status: AC

## 2014-02-28 NOTE — Progress Notes (Signed)
   Subjective:    Patient ID: Antonieta Perthasity A Underwood, female    DOB: [birth year], 33 y.o.   MRN: [id number]  Sinusitis This is a new problem. The current episode started in the past 7 days. Associated symptoms include ear pain, headaches and sinus pressure. Treatments tried: sudafed. The treatment provided no relief.    Friday had headache and cong and drainga  Now rad to ears  Coughing up some phlegm  No hi grade fever  isong tyl ibu prn  Some exposure to uri    Review of Systems  HENT: Positive for ear pain and sinus pressure.   Neurological: Positive for headaches.  No vomiting or diarrhea ROS otherwise negative     Objective:   Physical Exam  Alert good hydration. H&T moderate his congestion. Frontal tenderness. Max or tenderness. Neck supple some tender anterior nodes. Lungs clear. Heart regular rate and rhythm.      Assessment & Plan:  Impression acute rhinosinusitis with background type 1 diabetes plan Augmentin twice a day 10 days. Symptomatic care discussed. 1 signs discussed. WSL

## 2014-04-21 ENCOUNTER — Other Ambulatory Visit: Payer: Self-pay | Admitting: Family Medicine

## 2014-04-23 NOTE — Telephone Encounter (Signed)
Last seen 02/28/14 (sick) 

## 2014-04-24 NOTE — Telephone Encounter (Signed)
1 refill will need ov in spring for further refills

## 2014-05-17 ENCOUNTER — Encounter: Payer: Self-pay | Admitting: Nurse Practitioner

## 2014-05-17 ENCOUNTER — Ambulatory Visit (INDEPENDENT_AMBULATORY_CARE_PROVIDER_SITE_OTHER): Payer: BLUE CROSS/BLUE SHIELD | Admitting: Nurse Practitioner

## 2014-05-17 VITALS — BP 122/80 | Ht 62.0 in | Wt 219.0 lb

## 2014-05-17 DIAGNOSIS — E785 Hyperlipidemia, unspecified: Secondary | ICD-10-CM

## 2014-05-17 DIAGNOSIS — E109 Type 1 diabetes mellitus without complications: Secondary | ICD-10-CM

## 2014-05-17 DIAGNOSIS — R5383 Other fatigue: Secondary | ICD-10-CM

## 2014-05-17 DIAGNOSIS — Z79899 Other long term (current) drug therapy: Secondary | ICD-10-CM

## 2014-05-17 MED ORDER — INSULIN ASPART 100 UNIT/ML FLEXPEN: PEN_INJECTOR | SUBCUTANEOUS | Status: DC

## 2014-05-17 MED ORDER — PRAVASTATIN SODIUM 10 MG PO TABS: 10.0000 mg | ORAL_TABLET | Freq: Every day | ORAL | Status: DC

## 2014-05-17 MED ORDER — PHENTERMINE HCL 37.5 MG PO TABS: 37.5000 mg | ORAL_TABLET | Freq: Every day | ORAL | Status: DC

## 2014-05-17 NOTE — Progress Notes (Signed)
Subjective:  Presents for routine follow-up. Recently got insurance through her employer, plans to get her eye exam sometime in the next few months. Her last one was over 2 years ago. Very active job. Doing better with her diet since the holidays. Would like to restart phentermine for weight loss, has taken this without difficulty in the past. No numbness or pain in the feet. Defers flu vaccine. Gets regular preventive health physicals with gynecology.  Objective:   BP 122/80 mmHg  Ht 5\' 2"  (1.575 m)  Wt 219 lb (99.338 kg)  BMI 40.05 kg/m2 NAD. Alert, oriented. Lungs clear. Heart regular rate rhythm. Diabetic Foot Exam - Simple   Simple Foot Form  Diabetic Foot exam was performed with the following findings:  Yes 05/17/2014 11:30 AM  Visual Inspection  No deformities, no ulcerations, no other skin breakdown bilaterally:  Yes  Sensation Testing  Intact to touch and monofilament testing bilaterally:  Yes  Pulse Check  See comments:  Yes  Comments  DP pulses present bilat; warm toes with good cap refill.       Assessment:  Problem List Items Addressed This Visit      Endocrine   Type 1 diabetes - Primary   Relevant Medications   insulin aspart (NOVOLOG FLEXPEN) 100 UNIT/ML FlexPen   pravastatin (PRAVACHOL) tablet   Other Relevant Orders   Hemoglobin A1c   Microalbumin, urine     Other   Morbid obesity   Relevant Medications   phentermine (ADIPEX-P) 37.5 MG tablet   insulin aspart (NOVOLOG FLEXPEN) 100 UNIT/ML FlexPen   Other Relevant Orders   TSH   Hyperlipemia   Relevant Medications   pravastatin (PRAVACHOL) tablet   Other Relevant Orders   Lipid panel    Other Visit Diagnoses    High risk medication use        Relevant Orders    Hepatic function panel    Basic metabolic panel    Other fatigue        Relevant Orders    TSH      Plan: Meds ordered this encounter  Medications  . phentermine (ADIPEX-P) 37.5 MG tablet    Sig: Take 1 tablet (37.5 mg total) by  mouth daily before breakfast.    Dispense:  30 tablet    Refill:  2    Order Specific Question:  Supervising Provider    Answer:  Merlyn AlbertLUKING, WILLIAM S [[id number]]  . insulin aspart (NOVOLOG FLEXPEN) 100 UNIT/ML FlexPen    Sig: INJECT UP TO 20 UNITS SUBCUTANEOUSLY FOUR TIMES DAILY PER SLIDING SCALE AS DIRECTED    Dispense:  10 pen    Refill:  2    Order Specific Question:  Supervising Provider    Answer:  Merlyn AlbertLUKING, WILLIAM S [[id number]]  . pravastatin (PRAVACHOL) 10 MG tablet    Sig: Take 1 tablet (10 mg total) by mouth daily.    Dispense:  30 tablet    Refill:  5    Order Specific Question:  Supervising Provider    Answer:  Merlyn AlbertLUKING, WILLIAM S [[id number]]   Continue healthy diet regular activity and weight loss efforts. Still recommend referral to endocrinologist to discuss possible insulin pump. Patient will reconsider this now that she has insurance. Return in about 3 months (around 08/15/2014) for recheck. Call back sooner if any problems.

## 2014-05-18 LAB — BASIC METABOLIC PANEL
BUN: 8 mg/dL (ref 6–23)
CO2: 29 mEq/L (ref 19–32)
Calcium: 9.6 mg/dL (ref 8.4–10.5)
Chloride: 100 mEq/L (ref 96–112)
Creat: 0.86 mg/dL (ref 0.50–1.10)
GLUCOSE: 143 mg/dL — AB (ref 70–99)
Potassium: 4.7 mEq/L (ref 3.5–5.3)
Sodium: 138 mEq/L (ref 135–145)

## 2014-05-18 LAB — LIPID PANEL
CHOL/HDL RATIO: 2.8 ratio
Cholesterol: 229 mg/dL — ABNORMAL HIGH (ref 0–200)
HDL: 82 mg/dL (ref >39)
LDL CALC: 126 mg/dL — AB (ref 0–99)
Triglycerides: 104 mg/dL (ref <150)
VLDL: 21 mg/dL (ref 0–40)

## 2014-05-18 LAB — HEPATIC FUNCTION PANEL
ALBUMIN: 4.2 g/dL (ref 3.5–5.2)
ALT: 17 U/L (ref 0–35)
AST: 22 U/L (ref 0–37)
Alkaline Phosphatase: 96 U/L (ref 39–117)
Bilirubin, Direct: 0.1 mg/dL (ref 0.0–0.3)
Indirect Bilirubin: 0.5 mg/dL (ref 0.2–1.2)
Total Bilirubin: 0.6 mg/dL (ref 0.2–1.2)
Total Protein: 7.4 g/dL (ref 6.0–8.3)

## 2014-05-18 LAB — HEMOGLOBIN A1C
HEMOGLOBIN A1C: 7.5 % — AB (ref <5.7)
Mean Plasma Glucose: 169 mg/dL — ABNORMAL HIGH (ref <117)

## 2014-05-18 LAB — TSH: TSH: 1.63 u[IU]/mL (ref 0.350–4.500)

## 2014-05-18 LAB — MICROALBUMIN, URINE: MICROALB UR: 0.2 mg/dL (ref <2.0)

## 2014-05-20 ENCOUNTER — Encounter: Payer: Self-pay | Admitting: Nurse Practitioner

## 2014-06-07 ENCOUNTER — Ambulatory Visit (INDEPENDENT_AMBULATORY_CARE_PROVIDER_SITE_OTHER): Payer: BLUE CROSS/BLUE SHIELD | Admitting: Nurse Practitioner

## 2014-06-07 ENCOUNTER — Encounter: Payer: Self-pay | Admitting: Nurse Practitioner

## 2014-06-07 ENCOUNTER — Ambulatory Visit (HOSPITAL_COMMUNITY)
Admission: RE | Admit: 2014-06-07 | Discharge: 2014-06-07 | Disposition: A | Discharge status: Home / Self Care | Payer: BLUE CROSS/BLUE SHIELD | Source: Ambulatory Visit | Attending: Nurse Practitioner | Admitting: Nurse Practitioner

## 2014-06-07 VITALS — BP 130/78 | Ht 62.0 in | Wt 213.8 lb

## 2014-06-07 DIAGNOSIS — M25531 Pain in right wrist: Secondary | ICD-10-CM

## 2014-06-07 DIAGNOSIS — M25431 Effusion, right wrist: Secondary | ICD-10-CM | POA: Insufficient documentation

## 2014-06-07 MED ORDER — DICLOFENAC SODIUM 75 MG PO TBEC: 75.0000 mg | DELAYED_RELEASE_TABLET | Freq: Two times a day (BID) | ORAL | Status: DC

## 2014-06-08 ENCOUNTER — Encounter: Payer: Self-pay | Admitting: Nurse Practitioner

## 2014-06-08 NOTE — Progress Notes (Signed)
Subjective:  Presents for c/o right wrist pain x 1 1/2 weeks. Remote history of broken wrist in 1997. No recent injury. Job requires lifting and wrist movements. No neck, shoulder or elbow pain. Radiates into right hand. No weakness.  Objective:   BP 130/78 mmHg  Ht 5\' 2"  (1.575 m)  Wt 213 lb 12.8 oz (96.979 kg)  BMI 39.09 kg/m2  LMP 05/03/2014 NAD. Alert, oriented. Mild edema along the right wrist area; no erythema. Tender to palpation into lower right forearm. Passive ROM of right wrist limited due to tenderness. Strong radial pulse. Limited hand strength due to tenderness.  Assessment: Right wrist pain - Plan: DG Forearm Right, DG Wrist Complete Right  Plan:  Meds ordered this encounter  Medications  . diclofenac (VOLTAREN) 75 MG EC tablet    Sig: Take 1 tablet (75 mg total) by mouth 2 (two) times daily.    Dispense:  30 tablet    Refill:  0    Order Specific Question:  Supervising Provider    Answer:  Merlyn AlbertLUKING, WILLIAM S [[id number]]   Right wrist brace; wear as much as possible. Ice applications. xrays pending. Call back in 2 weeks if no better, sooner if worse.

## 2014-06-11 NOTE — Progress Notes (Signed)
[facility] 2/29 

## 2014-06-16 ENCOUNTER — Other Ambulatory Visit: Payer: Self-pay | Admitting: Family Medicine

## 2014-06-30 ENCOUNTER — Other Ambulatory Visit: Payer: Self-pay | Admitting: Nurse Practitioner

## 2014-07-27 LAB — HM DIABETES EYE EXAM

## 2014-08-02 ENCOUNTER — Other Ambulatory Visit: Payer: Self-pay | Admitting: *Deleted

## 2014-08-02 NOTE — Telephone Encounter (Signed)
This medication needs a break from taking it of at least 6 months. Then can reinitiate it. Long-term studies do not show that it's safe to take on a regular basis

## 2014-08-13 ENCOUNTER — Ambulatory Visit: Payer: BLUE CROSS/BLUE SHIELD | Admitting: Nurse Practitioner

## 2014-09-20 ENCOUNTER — Other Ambulatory Visit: Payer: Self-pay | Admitting: *Deleted

## 2014-09-20 MED ORDER — INSULIN ASPART 100 UNIT/ML FLEXPEN: PEN_INJECTOR | SUBCUTANEOUS | Status: DC

## 2014-09-24 ENCOUNTER — Telehealth: Payer: Self-pay | Admitting: Family Medicine

## 2014-09-24 MED ORDER — INSULIN ASPART 100 UNIT/ML FLEXPEN: PEN_INJECTOR | SUBCUTANEOUS | Status: DC

## 2014-09-24 NOTE — Telephone Encounter (Signed)
Pt needs refill on her Novolog ASAP, is completely out since last Thursday.  Please send to [facility]  This is the only pharmacy she uses.  Please do not send Rx's to any other pharmacy

## 2014-09-24 NOTE — Telephone Encounter (Signed)
[facility] [phone]

## 2014-09-24 NOTE — Telephone Encounter (Signed)
Patient notified that medication was sent to pharmacy.

## 2014-09-25 ENCOUNTER — Telehealth: Payer: Self-pay | Admitting: Nurse Practitioner

## 2014-09-25 NOTE — Telephone Encounter (Signed)
Calling to get order for blood work.

## 2014-09-26 ENCOUNTER — Other Ambulatory Visit: Payer: Self-pay | Admitting: Nurse Practitioner

## 2014-09-26 DIAGNOSIS — E109 Type 1 diabetes mellitus without complications: Secondary | ICD-10-CM | POA: Insufficient documentation

## 2014-09-26 DIAGNOSIS — E785 Hyperlipidemia, unspecified: Secondary | ICD-10-CM

## 2014-09-26 DIAGNOSIS — Z79899 Other long term (current) drug therapy: Secondary | ICD-10-CM

## 2014-09-26 NOTE — Telephone Encounter (Signed)
Notified patient that bloodwork has been ordered.  

## 2014-09-26 NOTE — Telephone Encounter (Signed)
done

## 2014-10-04 ENCOUNTER — Ambulatory Visit (INDEPENDENT_AMBULATORY_CARE_PROVIDER_SITE_OTHER): Payer: Medicaid Other | Admitting: Nurse Practitioner

## 2014-10-04 ENCOUNTER — Encounter: Payer: Self-pay | Admitting: Nurse Practitioner

## 2014-10-04 VITALS — BP 118/78 | Ht 62.0 in | Wt 207.4 lb

## 2014-10-04 DIAGNOSIS — E109 Type 1 diabetes mellitus without complications: Secondary | ICD-10-CM

## 2014-10-04 DIAGNOSIS — G4700 Insomnia, unspecified: Secondary | ICD-10-CM | POA: Diagnosis not present

## 2014-10-04 DIAGNOSIS — F419 Anxiety disorder, unspecified: Secondary | ICD-10-CM | POA: Diagnosis not present

## 2014-10-04 MED ORDER — TOPIRAMATE 100 MG PO TABS: 100.0000 mg | ORAL_TABLET | Freq: Two times a day (BID) | ORAL | Status: DC

## 2014-10-04 MED ORDER — DULOXETINE HCL 30 MG PO CPEP: 30.0000 mg | ORAL_CAPSULE | Freq: Every day | ORAL | Status: DC

## 2014-10-04 MED ORDER — ALPRAZOLAM 0.5 MG PO TABS: ORAL_TABLET | ORAL | Status: DC

## 2014-10-04 NOTE — Patient Instructions (Signed)
Melatonin 5 mg a hour before bed

## 2014-10-04 NOTE — Progress Notes (Signed)
Subjective:  Presents for routine follow-up. Had her eye exam in April which was normal. Blood sugars have improved, now in the 120s, seems to stabilize lately. Has had decreased stress, separated from her previous partner. Her and her son are now living in an apartment, plans to be moving to a new home soon. Minimal relief of her anxiety or depression with Celexa. Continues to have significant difficulty sleeping with frequent early morning awakenings. Takes Xanax mainly to sleep but this helps her go to sleep but not stay asleep.  Objective:   BP 118/78 mmHg  Ht 5\' 2"  (1.575 m)  Wt 207 lb 6 oz (94.065 kg)  BMI 37.92 kg/m2  LMP 10/04/2014 NAD. Alert, oriented. Lungs clear. Heart regular rate rhythm. Lab work pending was done earlier today.  Assessment:  Problem List Items Addressed This Visit      Endocrine   Type 1 diabetes - Primary     Other   Anxiety   Relevant Medications   ALPRAZolam (XANAX) 0.5 MG tablet   DULoxetine (CYMBALTA) 30 MG capsule    Other Visit Diagnoses    Insomnia          Plan:  Meds ordered this encounter  Medications  . topiramate (TOPAMAX) 100 MG tablet    Sig: Take 1 tablet (100 mg total) by mouth 2 (two) times daily.    Dispense:  60 tablet    Refill:  5    Order Specific Question:  Supervising Provider    Answer:  Merlyn Albert [[id number]]  . ALPRAZolam (XANAX) 0.5 MG tablet    Sig: TAKE ONE TABLET BY MOUTH AT BEDTIME AS NEEDED    Dispense:  30 tablet    Refill:  2    Order Specific Question:  Supervising Provider    Answer:  Merlyn Albert [[id number]]  . DULoxetine (CYMBALTA) 30 MG capsule    Sig: Take 1 capsule (30 mg total) by mouth daily.    Dispense:  30 capsule    Refill:  2    Please dispense name brand per medicaid formulary    Order Specific Question:  Supervising Provider    Answer:  Merlyn Albert [[id number]]   Hold on Celexa. Switch to Cymbalta as directed. DC med and call if any problems. Will see if this will help her  sleep. Return in about 1 month (around 11/03/2014) for recheck. Call back sooner if any problems.

## 2014-10-05 LAB — HEPATIC FUNCTION PANEL
ALBUMIN: 4.5 g/dL (ref 3.5–5.5)
ALT: 13 IU/L (ref 0–32)
AST: 18 IU/L (ref 0–40)
Alkaline Phosphatase: 76 IU/L (ref 39–117)
Bilirubin Total: 0.5 mg/dL (ref 0.0–1.2)
Bilirubin, Direct: 0.14 mg/dL (ref 0.00–0.40)
TOTAL PROTEIN: 7.5 g/dL (ref 6.0–8.5)

## 2014-10-05 LAB — HEMOGLOBIN A1C
ESTIMATED AVERAGE GLUCOSE: 154 mg/dL
Hgb A1c MFr Bld: 7 % — ABNORMAL HIGH (ref 4.8–5.6)

## 2014-10-05 LAB — LIPID PANEL
CHOL/HDL RATIO: 2.7 ratio (ref 0.0–4.4)
CHOLESTEROL TOTAL: 178 mg/dL (ref 100–199)
HDL: 67 mg/dL (ref >39)
LDL Calculated: 91 mg/dL (ref 0–99)
Triglycerides: 99 mg/dL (ref 0–149)
VLDL CHOLESTEROL CAL: 20 mg/dL (ref 5–40)

## 2014-11-06 ENCOUNTER — Ambulatory Visit: Payer: Medicaid Other | Admitting: Nurse Practitioner

## 2014-11-15 ENCOUNTER — Encounter: Payer: Self-pay | Admitting: Family Medicine

## 2014-11-15 ENCOUNTER — Ambulatory Visit (INDEPENDENT_AMBULATORY_CARE_PROVIDER_SITE_OTHER): Payer: Medicaid Other | Admitting: Nurse Practitioner

## 2014-11-15 ENCOUNTER — Encounter: Payer: Self-pay | Admitting: Nurse Practitioner

## 2014-11-15 VITALS — BP 130/70 | Ht 62.0 in | Wt 211.4 lb

## 2014-11-15 DIAGNOSIS — N921 Excessive and frequent menstruation with irregular cycle: Secondary | ICD-10-CM | POA: Diagnosis not present

## 2014-11-15 DIAGNOSIS — F419 Anxiety disorder, unspecified: Secondary | ICD-10-CM | POA: Diagnosis not present

## 2014-11-15 MED ORDER — SERTRALINE HCL 50 MG PO TABS: 50.0000 mg | ORAL_TABLET | Freq: Every day | ORAL | Status: DC

## 2014-11-15 MED ORDER — MEGESTROL ACETATE 40 MG PO TABS: ORAL_TABLET | ORAL | Status: DC

## 2014-11-17 ENCOUNTER — Encounter: Payer: Self-pay | Admitting: Nurse Practitioner

## 2014-11-17 NOTE — Progress Notes (Signed)
Subjective:  Presents for recheck. Minimal improvement of anxiety on Cymbalta. Continues to have some mild OCD symptoms including having items in very specific places. No adverse effects. Went to [place] ED last weekend, was diagnosed with very low iron and dehydration. Has had a menstrual cycle for the past 5 weeks, heavy at times. Had an appointment with her gynecologist but this was canceled by their office, her next available appointment was 8/25. Has used up to 12 pads per day. Has taken Megace in the past to help stop her cycle.  Objective:   BP 130/70 mmHg  Ht  (1.575 m)  Wt 211 lb 6 oz (95.879 kg)  BMI 38.65 kg/m2 NAD. Alert, oriented. Calm affect. Thoughts logical coherent and relevant. Dressed appropriately. Lungs clear. Heart regular rate rhythm.  Assessment:  Problem List Items Addressed This Visit      Other   Anxiety - Primary   Relevant Medications   sertraline (ZOLOFT) 50 MG tablet    Other Visit Diagnoses    Menorrhagia with irregular cycle          Plan:  Meds ordered this encounter  Medications  . megestrol (MEGACE) 40 MG tablet    Sig: 3 po qd x 5d then 2 po qd x 5d then one po qd    Dispense:  30 tablet    Refill:  0    Order Specific Question:  Supervising Provider    Answer:  Merlyn Albert [[id number]]  . sertraline (ZOLOFT) 50 MG tablet    Sig: Take 1 tablet (50 mg total) by mouth daily.    Dispense:  30 tablet    Refill:  2    Order Specific Question:  Supervising Provider    Answer:  Merlyn Albert [[id number]]   Start Megace as directed. Follow-up with gynecologist on 8/25. Call back in 7 days if no significant improvement in bleeding. Stop Cymbalta. Because of her work schedule, wait 2-3 days then begin Zoloft as directed. Reviewed potential adverse effects. DC med and call if any problems. Also strongly recommend cognitive behavioral therapy if OCD symptoms worsen. Return in about 1 month (around 12/16/2014).

## 2014-12-14 ENCOUNTER — Other Ambulatory Visit: Payer: Self-pay | Admitting: *Deleted

## 2014-12-14 MED ORDER — INSULIN ASPART 100 UNIT/ML FLEXPEN
PEN_INJECTOR | SUBCUTANEOUS | Status: DC
Start: 2014-12-14 — End: 2014-12-21

## 2014-12-18 ENCOUNTER — Ambulatory Visit: Payer: Medicaid Other | Admitting: Nurse Practitioner

## 2014-12-21 ENCOUNTER — Other Ambulatory Visit: Payer: Self-pay | Admitting: *Deleted

## 2014-12-21 ENCOUNTER — Telehealth: Payer: Self-pay | Admitting: Family Medicine

## 2014-12-21 MED ORDER — INSULIN GLARGINE 100 UNIT/ML SOLOSTAR PEN: PEN_INJECTOR | SUBCUTANEOUS | Status: DC

## 2014-12-21 MED ORDER — INSULIN ASPART 100 UNIT/ML ~~LOC~~ SOLN: SUBCUTANEOUS | Status: DC

## 2014-12-21 MED ORDER — INSULIN GLARGINE 100 UNIT/ML ~~LOC~~ SOLN: SUBCUTANEOUS | Status: DC

## 2014-12-21 NOTE — Telephone Encounter (Signed)
Patient needs her insulin aspart (NOVOLOG FLEXPEN) 100 UNIT/ML FlexPen - patient has new insurance and can't afford the copay for the pen and would like to go back to using the Novolog vials - please send in Rx with refills to [facility] in [place] (this is patient's new and current pharmacy)  Needs a Rx with refills for her LANTUS SOLOSTAR 100 UNIT/ML Solostar Pen - this also needs to be a vial due to excessive copay  Also will need a Rx for syringes   Needs this today, she's completely out and has only been using long acting insulin for 2 days due to previous pharmacy's mix up with her coverage  Please call patient when done [phone]

## 2014-12-22 NOTE — Telephone Encounter (Signed)
meds sent to pharm. Pt notified.  

## 2015-01-06 ENCOUNTER — Encounter: Payer: Self-pay | Admitting: Nurse Practitioner

## 2015-01-08 ENCOUNTER — Other Ambulatory Visit: Payer: Self-pay | Admitting: *Deleted

## 2015-01-08 ENCOUNTER — Other Ambulatory Visit: Payer: Self-pay | Admitting: Nurse Practitioner

## 2015-01-08 MED ORDER — PHENTERMINE HCL 37.5 MG PO TABS: 37.5000 mg | ORAL_TABLET | Freq: Every day | ORAL | Status: DC

## 2015-01-19 ENCOUNTER — Other Ambulatory Visit: Payer: Self-pay | Admitting: Family Medicine

## 2015-01-21 ENCOUNTER — Other Ambulatory Visit: Payer: Self-pay | Admitting: Nurse Practitioner

## 2015-01-21 MED ORDER — ALPRAZOLAM 0.5 MG PO TABS: ORAL_TABLET | ORAL | Status: DC

## 2015-02-11 ENCOUNTER — Encounter: Payer: Self-pay | Admitting: Nurse Practitioner

## 2015-02-13 ENCOUNTER — Telehealth: Payer: Self-pay | Admitting: *Deleted

## 2015-02-13 MED ORDER — DOXYCYCLINE HYCLATE 100 MG PO TABS: ORAL_TABLET | ORAL | Status: DC

## 2015-02-13 NOTE — Telephone Encounter (Signed)
Patient returned call today stating that still she got a tattoo last week and it was healing fine, and then a patient on her job dug her nails into her arm and the area where the tattoo is is now red, warm to the touch with discharge. Patient also stated that she has been running a fever around a 102. Offered patient an appointment today and patient stated that she could not come in but doesn't know if this can wait until her appointment tomorrow with Eber Jonesarolyn. Patient want to know if something can be called in. Please advise?

## 2015-02-13 NOTE — Telephone Encounter (Signed)
[facility]

## 2015-02-13 NOTE — Telephone Encounter (Signed)
Called patient and informed her per Dr.Steve Luking- Doxycycline 100 MG 1 tablet twice a day was sent into pharmacy also needs to keep appointment tomorrow. Patient verbalized understanding.

## 2015-02-13 NOTE — Telephone Encounter (Signed)
I called right back and they told me that they where just calling about my appointment on Thursday even told them about the message I [facility] on here [facility] and they said no one had called me from that office except for the reminder about my appointment cuz I asked if somebody had called to try to get me an early appointment and they said no the only reason they call was to remind me about Thursday cuz I called right after I missed your call so now it will have to be Thuresday because i work on Wednesday.    ----- Message -----    From: Nurse Reginia NaasWendy M R    Sent: 02/12/2015 8:16 AM EDT    To: Antonieta Perthasity A Underwood    Subject: RE: Non-Urgent Medical Question        Eber JonesCarolyn is out of office until Thursday. Tried to call you to get you an appt today for this issue. We do not recommend it wait til Thursday. Please call our office [phone] and schedule appt. Thank you         ----- Message -----     From: Antonieta PertUNDERWOOD,Ammara A     Sent: 02/11/2015 6:16 PM EDT      To: Campbell RichesHoskins, Carolyn C, NP    Subject: Non-Urgent Medical Question        I'm coming in to the office on thursday at 10 to see you but I had gotten a new tattoo last week and I was doing fine and then 1 of my president of the nursing home grab my chest and scratched it and now it is infected looking its really red and swollen around it getting over I am breaking out rash it coming from a residenced being older and sometimes their fingers not being a clean patient I'm really concerned so I am getting in touch with you I think I have an infection I know I'm coming to see you thursday but I want to give you a heads up and see if you need me to come in sooner me being a diabetic and it looks really bad

## 2015-02-13 NOTE — Telephone Encounter (Signed)
That kind of fever with rash can be tricky, can start doxy 100 bid ten d but needs to be seen tomorrow also

## 2015-02-13 NOTE — Telephone Encounter (Signed)
[facility].

## 2015-02-14 ENCOUNTER — Ambulatory Visit (INDEPENDENT_AMBULATORY_CARE_PROVIDER_SITE_OTHER): Payer: PRIVATE HEALTH INSURANCE | Admitting: Nurse Practitioner

## 2015-02-14 ENCOUNTER — Encounter: Payer: Self-pay | Admitting: Family Medicine

## 2015-02-14 ENCOUNTER — Encounter: Payer: Self-pay | Admitting: Nurse Practitioner

## 2015-02-14 VITALS — BP 132/92 | Ht 62.0 in | Wt 216.5 lb

## 2015-02-14 DIAGNOSIS — E109 Type 1 diabetes mellitus without complications: Secondary | ICD-10-CM

## 2015-02-14 DIAGNOSIS — E1140 Type 2 diabetes mellitus with diabetic neuropathy, unspecified: Secondary | ICD-10-CM

## 2015-02-14 DIAGNOSIS — L03313 Cellulitis of chest wall: Secondary | ICD-10-CM | POA: Diagnosis not present

## 2015-02-14 LAB — POCT GLYCOSYLATED HEMOGLOBIN (HGB A1C): HEMOGLOBIN A1C: 8.9

## 2015-02-14 MED ORDER — INSULIN ASPART 100 UNIT/ML ~~LOC~~ SOLN: SUBCUTANEOUS | Status: DC

## 2015-02-14 MED ORDER — TRIAMCINOLONE ACETONIDE 0.1 % EX CREA: 1.0000 "application " | TOPICAL_CREAM | Freq: Two times a day (BID) | CUTANEOUS | Status: DC

## 2015-02-14 MED ORDER — PHENTERMINE HCL 37.5 MG PO TABS: 37.5000 mg | ORAL_TABLET | Freq: Every day | ORAL | Status: DC

## 2015-02-14 MED ORDER — GABAPENTIN 100 MG PO CAPS: 100.0000 mg | ORAL_CAPSULE | Freq: Two times a day (BID) | ORAL | Status: DC

## 2015-02-14 NOTE — Progress Notes (Signed)
Subjective:  Presents for c/o numbness, burning and tingling in both hands but much worse in the feet. 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 Feet hurt all the time even when she is not active. Taking her insulin and following sliding scale. When asked twice about her BS, she states it sometimes drops at night. States pharmacy never got Rx for phentermine. Also just started Doxycyline for infected tattoo on left chest area after getting scratched. Very pruritic at times.   Objective:   BP 132/92 mmHg  Ht  (1.575 m)  Wt 216 lb 8 oz (98.204 kg)  BMI 39.59 kg/m2 NAD. Alert, oriented. Lungs clear. Heart RRR. No wrist tenderness with ROM. Strong radial pulses bilat. Hand strength 5+ bilat. Superficial abrasions with mild erythema on left chest wall within a tattoo.  Diabetic Foot Exam - Simple   Simple Foot Form  Diabetic Foot exam was performed with the following findings:  Yes 02/14/2015 11:12 AM  Visual Inspection  No deformities, no ulcerations, no other skin breakdown bilaterally:  Yes  Sensation Testing  Intact to touch and monofilament testing bilaterally:  Yes  Pulse Check  Comments  DP pulses present bilat; toes warm     Results for orders placed or performed in visit on 02/14/15  POCT HgB A1C  Result Value Ref Range   Hemoglobin A1C 8.9    (7.0 on 10/03/24)  Assessment:  Problem List Items Addressed This Visit      Endocrine   Type 1 diabetes (HCC) - Primary   Relevant Medications   insulin aspart (NOVOLOG) 100 UNIT/ML injection   Other Relevant Orders   POCT HgB A1C (Completed)     Nervous and Auditory   Diabetic neuropathy, painful (HCC)   Relevant Medications   insulin aspart (NOVOLOG) 100 UNIT/ML injection   Other Relevant Orders   POCT HgB A1C (Completed)     Other   Morbid obesity (HCC)   Relevant Medications   insulin aspart (NOVOLOG) 100 UNIT/ML injection   phentermine (ADIPEX-P) 37.5 MG tablet    Other  Visit Diagnoses    Cellulitis of chest wall           Plan:  Meds ordered this encounter  Medications  . triamcinolone cream (KENALOG) 0.1 %    Sig: Apply 1 application topically 2 (two) times daily. Prn rash; use up to 2 weeks    Dispense:  30 g    Refill:  0    Order Specific Question:  Supervising Provider    Answer:  Merlyn Albert [[id number]]  . insulin aspart (NOVOLOG) 100 UNIT/ML injection    Sig: Inject up to 20 units qid per sliding scale    Dispense:  10 mL    Refill:  5    Please give a 30 day supply    Order Specific Question:  Supervising Provider    Answer:  Merlyn Albert [[id number]]  . phentermine (ADIPEX-P) 37.5 MG tablet    Sig: Take 1 tablet (37.5 mg total) by mouth daily before breakfast.    Dispense:  30 tablet    Refill:  2    Order Specific Question:  Supervising Provider    Answer:  Merlyn Albert [[id number]]  . gabapentin (NEURONTIN) 100 MG capsule    Sig: Take 1 capsule (100 mg total) by mouth 2 (two) times daily.    Dispense:  60 capsule    Refill:  0  Order Specific Question:  Supervising Provider    Answer:  Merlyn AlbertLUKING, WILLIAM S [[id number]]   Strongly recommend compliance with insulin including sliding scale with significant increase in HgbA1C. Add Gabapentin to regimen. Will slowly titrate dose. DC med and send message through my chart. Also, patient to send message after 1-2 weeks if dose needs to go up. Complete Doxycyline. Use steroid cream for itching. Call back if worsens or persists. Return in about 3 months (around 05/17/2015) for recheck.

## 2015-02-17 ENCOUNTER — Encounter: Payer: Self-pay | Admitting: Nurse Practitioner

## 2015-03-26 ENCOUNTER — Other Ambulatory Visit: Payer: Self-pay | Admitting: Nurse Practitioner

## 2015-03-27 MED ORDER — GABAPENTIN 100 MG PO CAPS: 100.0000 mg | ORAL_CAPSULE | Freq: Two times a day (BID) | ORAL | Status: DC

## 2015-03-27 NOTE — Addendum Note (Signed)
Addended by: Campbell RichesHOSKINS, CAROLYN C on: 03/27/2015 10:12 AM   Modules accepted: Orders

## 2015-04-18 ENCOUNTER — Other Ambulatory Visit: Payer: Self-pay | Admitting: Family Medicine

## 2015-04-18 ENCOUNTER — Telehealth: Payer: Self-pay | Admitting: Family Medicine

## 2015-04-18 MED ORDER — INSULIN GLARGINE 100 UNIT/ML ~~LOC~~ SOLN: SUBCUTANEOUS | Status: DC

## 2015-04-18 NOTE — Addendum Note (Signed)
Addended by: Theodora BlowREWS, SHANNON R on: 04/18/2015 10:04 AM   Modules accepted: Orders

## 2015-04-18 NOTE — Telephone Encounter (Signed)
See ph message

## 2015-04-18 NOTE — Telephone Encounter (Signed)
Done

## 2015-04-18 NOTE — Telephone Encounter (Signed)
Ok plus three ref 

## 2015-05-22 ENCOUNTER — Telehealth: Payer: Self-pay | Admitting: Family Medicine

## 2015-05-22 ENCOUNTER — Telehealth: Payer: Self-pay | Admitting: *Deleted

## 2015-05-22 ENCOUNTER — Ambulatory Visit (INDEPENDENT_AMBULATORY_CARE_PROVIDER_SITE_OTHER): Payer: PRIVATE HEALTH INSURANCE | Admitting: Nurse Practitioner

## 2015-05-22 ENCOUNTER — Encounter: Payer: Self-pay | Admitting: Nurse Practitioner

## 2015-05-22 ENCOUNTER — Encounter: Payer: Self-pay | Admitting: Family Medicine

## 2015-05-22 ENCOUNTER — Other Ambulatory Visit: Payer: Self-pay | Admitting: *Deleted

## 2015-05-22 VITALS — BP 122/74 | Temp 98.9°F | Ht 62.0 in | Wt 207.0 lb

## 2015-05-22 DIAGNOSIS — J069 Acute upper respiratory infection, unspecified: Secondary | ICD-10-CM

## 2015-05-22 MED ORDER — GABAPENTIN 100 MG PO CAPS: ORAL_CAPSULE | ORAL | Status: DC

## 2015-05-22 MED ORDER — AZITHROMYCIN 250 MG PO TABS: ORAL_TABLET | ORAL | Status: DC

## 2015-05-22 MED ORDER — INSULIN ASPART 100 UNIT/ML ~~LOC~~ SOLN: SUBCUTANEOUS | Status: DC

## 2015-05-22 MED ORDER — INSULIN GLARGINE 100 UNIT/ML ~~LOC~~ SOLN: SUBCUTANEOUS | Status: DC

## 2015-05-22 NOTE — Telephone Encounter (Signed)
incoming fax from [employer]. Requesting refill on phentermine 37.5mg  #30 one qam. Last seen today.

## 2015-05-22 NOTE — Telephone Encounter (Signed)
insulin aspart (NOVOLOG) 100 UNIT/ML injection  [facility]   New to this pharmacy was using [facility] now closed

## 2015-05-22 NOTE — Telephone Encounter (Signed)
Spoke with patient and informed her per protocol refill on Novolog was sent over to [facility] in [place]. Patient verbalized understanding.

## 2015-05-22 NOTE — Telephone Encounter (Signed)
[facility] 05/22/15 

## 2015-05-22 NOTE — Telephone Encounter (Signed)
I reviewed records; she has already taken 6 months out of the year. Will need to be off for 6 months, then she can resume.

## 2015-05-24 ENCOUNTER — Encounter: Payer: Self-pay | Admitting: Nurse Practitioner

## 2015-05-24 NOTE — Progress Notes (Signed)
Subjective:  Presents for complaints of fever ear pain and headache for the past 3 days. Max temp was 102. Sore throat mainly at nighttime. Head congestion. Postnasal drainage. Minimal cough. No wheezing. Bilateral ear pain. No vomiting diarrhea or abdominal pain. Taking fluids well. Voiding normal limit. Patient is diabetic, has noticed increase in her blood sugar. Note she has Nexplanon for birth control.  Objective:   BP 122/74 mmHg  Temp(Src) 98.9 F (37.2 C) (Oral)  Ht  (1.575 m)  Wt 207 lb (93.895 kg)  BMI 37.85 kg/m2 NAD. Alert, oriented. TMs clear effusion, no erythema. Posterior pharynx moderately erythematous with green PND noted. Neck supple with mild soft anterior adenopathy. Lungs clear. Heart regular rate rhythm.   Assessment: Acute upper respiratory infection  Plan:  Meds ordered this encounter  Medications  . azithromycin (ZITHROMAX Z-PAK) 250 MG tablet    Sig: Take 2 tablets (500 mg) on  Day 1,  followed by 1 tablet (250 mg) once daily on Days 2 through 5.    Dispense:  6 each    Refill:  0    Order Specific Question:  Supervising Provider    Answer:  Merlyn Albert [[id number]]  . gabapentin (NEURONTIN) 100 MG capsule    Sig: 2 po BID    Dispense:  120 capsule    Refill:  5    Order Specific Question:  Supervising Provider    Answer:  Riccardo Dubin   Explained illness is most likely viral but with patient's history of diabetes, will start Z-Pak today. Reviewed symptomatic care warning signs. Call back in 48 hours if no improvement, sooner if worse. Also patient mentions that Neurontin was helping with her nerve pain initially but does not seem to be as effective, increase dose to 2 by mouth twice a day. Call back if no improvement.

## 2015-05-26 ENCOUNTER — Encounter: Payer: Self-pay | Admitting: Nurse Practitioner

## 2015-05-29 NOTE — Telephone Encounter (Signed)
Patient advised she has already taken adipex 6 months out of the year. Will need to be off for 6 months, then she can resume. Patient verbalized understanding.

## 2015-05-29 NOTE — Telephone Encounter (Signed)
Left message to return call 

## 2015-06-11 ENCOUNTER — Ambulatory Visit: Payer: PRIVATE HEALTH INSURANCE | Admitting: Family Medicine

## 2015-07-03 ENCOUNTER — Encounter: Payer: Self-pay | Admitting: Family Medicine

## 2015-07-03 ENCOUNTER — Ambulatory Visit (INDEPENDENT_AMBULATORY_CARE_PROVIDER_SITE_OTHER): Payer: PRIVATE HEALTH INSURANCE | Admitting: Family Medicine

## 2015-07-03 VITALS — BP 130/80 | Temp 98.2°F | Ht 62.0 in | Wt 212.2 lb

## 2015-07-03 DIAGNOSIS — J111 Influenza due to unidentified influenza virus with other respiratory manifestations: Secondary | ICD-10-CM | POA: Diagnosis not present

## 2015-07-03 MED ORDER — ETODOLAC 400 MG PO TABS: 400.0000 mg | ORAL_TABLET | Freq: Two times a day (BID) | ORAL | Status: DC | PRN

## 2015-07-03 MED ORDER — OSELTAMIVIR PHOSPHATE 75 MG PO CAPS: 75.0000 mg | ORAL_CAPSULE | Freq: Two times a day (BID) | ORAL | Status: DC

## 2015-07-03 NOTE — Progress Notes (Signed)
   Subjective:    Patient ID: Kimberly Becker, female    DOB: 01-20-1981, 35 y.o.   MRN: [id number]  Fever  This is a new problem. The current episode started in the past 7 days. The problem occurs intermittently. The problem has been unchanged. Associated symptoms include ear pain, headaches and muscle aches. Associated symptoms comments: Runny nose. Treatments tried: zyrtec. The treatment provided no relief.  Arm Pain  The incident occurred more than 1 week ago. The injury mechanism is unknown. The pain is present in the left forearm. The quality of the pain is described as shooting. The pain is moderate. The pain has been intermittent since the incident. The symptoms are aggravated by movement. She has tried nothing for the symptoms. The treatment provided no relief.   Felt bad hit hard  Headache off an runny nose  Cough not much, but tightness in chst with exretion   tmax 102   Works in [employer]  Review of Systems  Constitutional: Positive for fever.  HENT: Positive for ear pain.   Neurological: Positive for headaches.       Objective:   Physical Exam  alertmoderate malaise. Positive fever H&T moderate his congestion pharynx no exudate necksupplelungs bronchial cough heart regular in rhythm      Assessment & Plan:  Impression influenza plan Tamiflu twice a day 5 days. Symptom care discussed warning signs discussed

## 2015-09-13 ENCOUNTER — Other Ambulatory Visit: Payer: Self-pay | Admitting: Family Medicine

## 2015-09-13 NOTE — Telephone Encounter (Signed)
Ok times two 

## 2015-12-13 ENCOUNTER — Other Ambulatory Visit: Payer: Self-pay | Admitting: Family Medicine

## 2015-12-13 NOTE — Telephone Encounter (Signed)
Patient called to let us know to have this done today because after today, she will be out of insulin.

## 2015-12-30 ENCOUNTER — Encounter: Payer: Self-pay | Admitting: Nurse Practitioner

## 2015-12-30 ENCOUNTER — Ambulatory Visit (INDEPENDENT_AMBULATORY_CARE_PROVIDER_SITE_OTHER): Payer: BLUE CROSS/BLUE SHIELD | Admitting: Nurse Practitioner

## 2015-12-30 ENCOUNTER — Encounter: Payer: Self-pay | Admitting: Family Medicine

## 2015-12-30 VITALS — BP 122/80 | Temp 98.9°F | Ht 62.0 in | Wt 208.0 lb

## 2015-12-30 DIAGNOSIS — J029 Acute pharyngitis, unspecified: Secondary | ICD-10-CM | POA: Diagnosis not present

## 2015-12-30 DIAGNOSIS — E1140 Type 2 diabetes mellitus with diabetic neuropathy, unspecified: Secondary | ICD-10-CM | POA: Diagnosis not present

## 2015-12-30 DIAGNOSIS — F419 Anxiety disorder, unspecified: Secondary | ICD-10-CM

## 2015-12-30 DIAGNOSIS — E785 Hyperlipidemia, unspecified: Secondary | ICD-10-CM

## 2015-12-30 DIAGNOSIS — E109 Type 1 diabetes mellitus without complications: Secondary | ICD-10-CM | POA: Diagnosis not present

## 2015-12-30 LAB — POCT GLYCOSYLATED HEMOGLOBIN (HGB A1C): Hemoglobin A1C: 7.1

## 2015-12-30 LAB — POCT RAPID STREP A (OFFICE): RAPID STREP A SCREEN: NEGATIVE

## 2015-12-30 MED ORDER — INSULIN ASPART 100 UNIT/ML ~~LOC~~ SOLN: SUBCUTANEOUS | 5 refills | Status: DC

## 2015-12-30 MED ORDER — INSULIN GLARGINE 100 UNIT/ML ~~LOC~~ SOLN: SUBCUTANEOUS | 5 refills | Status: DC

## 2015-12-30 MED ORDER — GABAPENTIN 100 MG PO CAPS: ORAL_CAPSULE | ORAL | 5 refills | Status: DC

## 2015-12-30 NOTE — Progress Notes (Signed)
Subjective:  Presents for routine follow-up. Has stopped several of her medications due to previous lack of insurance. Now has insurance through the exchange. 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 Takes a rare Xanax. Active job. Doing very well with her diet. Has decrease sugar and simple carbs. No chest pain/ischemic type pain or shortness of breath. Having slight increase in the pain in her feet due to being more active. Also complaints of cough and pain along the right neck area into the ear that began 2 days ago. Max temp 102. No rash. Sore throat mainly on the right side. Minimal cough. Minimal headache. Taking fluids well. Voiding normal limit. Is now involved in a heterosexual relationship, doing much better mentally.  Objective:   BP 122/80   Temp 98.9 F (37.2 C) (Oral)   Ht 5\' 2"  (1.575 m)   Wt 208 lb (94.3 kg)   BMI 38.04 kg/m  NAD. Alert, oriented. Cheerful affect. TMs mild clear effusion more on the right, no erythema. Pharynx minimal erythema, several pockets of white exudate noted on the right side. Neck supple with mild soft tender right anterior adenopathy. Lungs clear. Heart regular rate rhythm. Results for orders placed or performed in visit on 12/30/15  POCT glycosylated hemoglobin (Hb A1C)  Result Value Ref Range   Hemoglobin A1C 7.1   POCT rapid strep A  Result Value Ref Range   Rapid Strep A Screen Negative Negative   Diabetic Foot Exam - Simple   Simple Foot Form Diabetic Foot exam was performed with the following findings:  Yes 12/30/2015 11:20 AM  Visual Inspection No deformities, no ulcerations, no other skin breakdown bilaterally:  Yes Sensation Testing Intact to touch and monofilament testing bilaterally:  Yes Pulse Check See comments:  Yes Comments DP pulses present bilat; toes warm with normal cap refill      Assessment:  Problem List Items Addressed This Visit      Endocrine   Type 1  diabetes (HCC) - Primary   Relevant Medications   insulin glargine (LANTUS) 100 UNIT/ML injection   insulin aspart (NOVOLOG) 100 UNIT/ML injection   Other Relevant Orders   POCT glycosylated hemoglobin (Hb A1C) (Completed)   Hepatic function panel   Microalbumin / creatinine urine ratio   VITAMIN D 25 Hydroxy (Vit-D Deficiency, Fractures)     Nervous and Auditory   Diabetic neuropathy, painful (HCC)   Relevant Medications   insulin glargine (LANTUS) 100 UNIT/ML injection   insulin aspart (NOVOLOG) 100 UNIT/ML injection   Other Relevant Orders   Hepatic function panel   VITAMIN D 25 Hydroxy (Vit-D Deficiency, Fractures)     Other   Anxiety   Relevant Orders   Hepatic function panel   VITAMIN D 25 Hydroxy (Vit-D Deficiency, Fractures)   Hyperlipidemia   Relevant Orders   Lipid panel   Hepatic function panel   VITAMIN D 25 Hydroxy (Vit-D Deficiency, Fractures)    Other Visit Diagnoses    Acute pharyngitis, unspecified etiology       Relevant Orders   Hepatic function panel   VITAMIN D 25 Hydroxy (Vit-D Deficiency, Fractures)   POCT rapid strep A (Completed)   Strep A DNA probe     Plan:  Meds ordered this encounter  Medications  . insulin glargine (LANTUS) 100 UNIT/ML injection    Sig: Inject up to 50 units as directed at qhs    Dispense:  2 vial  Refill:  5    Order Specific Question:   Supervising Provider    Answer:   Merlyn Albert [[id number]]  . insulin aspart (NOVOLOG) 100 UNIT/ML injection    Sig: INJECT UP TO 20 UNITS FOUR TIMES DAILY PER SLIDING SCALE    Dispense:  2 vial    Refill:  5    Order Specific Question:   Supervising Provider    Answer:   Merlyn Albert [[id number]]  . gabapentin (NEURONTIN) 100 MG capsule    Sig: 2 po BID    Dispense:  120 capsule    Refill:  5    Order Specific Question:   Supervising Provider    Answer:   Merlyn Albert [[id number]]   Hemoglobin A1c much improved from previous visit. Encouraged continued activity and watching  her diet. Continue current medications. Patient wishes to hold on cholesterol medicine at this point due to finances. Routine lab work and throat culture pending. Call back in 72 hours if no improvement in symptoms, sooner if worse. Return in about 4 months (around 04/30/2016) for work excuse Sunday. Also recommend diabetic eye exam when patient can afford this.

## 2015-12-31 LAB — HEPATIC FUNCTION PANEL
ALBUMIN: 4.7 g/dL (ref 3.5–5.5)
ALK PHOS: 104 IU/L (ref 39–117)
ALT: 15 IU/L (ref 0–32)
AST: 15 IU/L (ref 0–40)
Bilirubin Total: 0.8 mg/dL (ref 0.0–1.2)
Bilirubin, Direct: 0.18 mg/dL (ref 0.00–0.40)
Total Protein: 7.7 g/dL (ref 6.0–8.5)

## 2015-12-31 LAB — LIPID PANEL
CHOLESTEROL TOTAL: 228 mg/dL — AB (ref 100–199)
Chol/HDL Ratio: 3.2 ratio units (ref 0.0–4.4)
HDL: 72 mg/dL (ref >39)
LDL Calculated: 135 mg/dL — ABNORMAL HIGH (ref 0–99)
Triglycerides: 105 mg/dL (ref 0–149)
VLDL CHOLESTEROL CAL: 21 mg/dL (ref 5–40)

## 2015-12-31 LAB — MICROALBUMIN / CREATININE URINE RATIO
Creatinine, Urine: 160 mg/dL
MICROALB/CREAT RATIO: 9.7 mg/g creat (ref 0.0–30.0)
MICROALBUM., U, RANDOM: 15.5 ug/mL

## 2015-12-31 LAB — STREP A DNA PROBE: Strep Gp A Direct, DNA Probe: POSITIVE — AB

## 2015-12-31 LAB — PLEASE NOTE

## 2015-12-31 LAB — VITAMIN D 25 HYDROXY (VIT D DEFICIENCY, FRACTURES): Vit D, 25-Hydroxy: 35.1 ng/mL (ref 30.0–100.0)

## 2016-01-01 ENCOUNTER — Encounter: Payer: Self-pay | Admitting: Nurse Practitioner

## 2016-01-02 ENCOUNTER — Other Ambulatory Visit: Payer: Self-pay | Admitting: Nurse Practitioner

## 2016-01-02 ENCOUNTER — Ambulatory Visit: Payer: PRIVATE HEALTH INSURANCE | Admitting: Nurse Practitioner

## 2016-01-02 MED ORDER — AZITHROMYCIN 250 MG PO TABS: ORAL_TABLET | ORAL | 0 refills | Status: DC

## 2016-01-03 ENCOUNTER — Other Ambulatory Visit: Payer: Self-pay | Admitting: Nurse Practitioner

## 2016-02-12 ENCOUNTER — Encounter: Payer: Self-pay | Admitting: Nurse Practitioner

## 2016-02-13 ENCOUNTER — Other Ambulatory Visit: Payer: Self-pay | Admitting: *Deleted

## 2016-02-13 MED ORDER — GABAPENTIN 100 MG PO CAPS: ORAL_CAPSULE | ORAL | 0 refills | Status: DC

## 2016-02-13 NOTE — Telephone Encounter (Signed)
Increase gabapentin to 100mg . 3 po tid per dr Brett Canalessteve. Pt notified and script sent to pharm with new directions.

## 2016-02-29 ENCOUNTER — Encounter: Payer: Self-pay | Admitting: Nurse Practitioner

## 2016-03-02 ENCOUNTER — Other Ambulatory Visit: Payer: Self-pay | Admitting: Nurse Practitioner

## 2016-03-10 ENCOUNTER — Other Ambulatory Visit: Payer: Self-pay | Admitting: Family Medicine

## 2016-03-11 ENCOUNTER — Other Ambulatory Visit: Payer: Self-pay | Admitting: *Deleted

## 2016-03-11 ENCOUNTER — Encounter: Payer: Self-pay | Admitting: Family Medicine

## 2016-03-11 ENCOUNTER — Ambulatory Visit (INDEPENDENT_AMBULATORY_CARE_PROVIDER_SITE_OTHER): Payer: BLUE CROSS/BLUE SHIELD | Admitting: Nurse Practitioner

## 2016-03-11 ENCOUNTER — Encounter: Payer: Self-pay | Admitting: Nurse Practitioner

## 2016-03-11 VITALS — BP 112/70 | Temp 98.4°F | Ht 62.0 in | Wt 220.5 lb

## 2016-03-11 DIAGNOSIS — B349 Viral infection, unspecified: Secondary | ICD-10-CM | POA: Diagnosis not present

## 2016-03-11 NOTE — Telephone Encounter (Signed)
Change to thirty no more than one mo'w worth per rx for controlled substances , one qhs prn sleep, five ref, may ref monthly

## 2016-03-12 ENCOUNTER — Encounter: Payer: Self-pay | Admitting: Nurse Practitioner

## 2016-03-12 NOTE — Progress Notes (Signed)
Subjective:  Presents for c/o fever and congestion that began yesterday. Max temp 101. Sore throat. Chills. Malaise. Myalgias. Facial pressure. Cough worse at night. Bilateral ear pain. No V/D or abd pain. Taking fluids well. Voiding nl. BS this am 175 but running higher this afternoon.   Objective:   BP 112/70   Temp 98.4 F (36.9 C) (Oral)   Ht 5\' 2"  (1.575 m)   Wt 220 lb 8 oz (100 kg)   BMI 40.33 kg/m  NAD. Alert, oriented. TMs clear effusion. Pharynx clear. Neck supple with mild anterior adenopathy. Lungs clear. Heart RRR.   Assessment: Viral illness  Plan: reviewed symptomatic care and warning signs. Monitor BS and use sliding scale if needed. Call back in 48 hours if no improvement, sooner if needed.

## 2016-03-13 ENCOUNTER — Encounter: Payer: Self-pay | Admitting: Nurse Practitioner

## 2016-03-13 ENCOUNTER — Other Ambulatory Visit: Payer: Self-pay | Admitting: Nurse Practitioner

## 2016-03-13 MED ORDER — AMOXICILLIN-POT CLAVULANATE 875-125 MG PO TABS: 1.0000 | ORAL_TABLET | Freq: Two times a day (BID) | ORAL | 0 refills | Status: DC

## 2016-03-24 ENCOUNTER — Other Ambulatory Visit: Payer: Self-pay | Admitting: Family Medicine

## 2016-05-07 ENCOUNTER — Telehealth: Payer: Self-pay | Admitting: *Deleted

## 2016-05-07 NOTE — Telephone Encounter (Signed)
Gabapentin 100 mg #270 3 capsules TID for Diabetic Neuropathy approved thru [employer]. Approved 05/06/16- 05/06/17. Pharmacy notified.

## 2016-05-11 ENCOUNTER — Encounter: Payer: Self-pay | Admitting: Nurse Practitioner

## 2016-05-11 ENCOUNTER — Other Ambulatory Visit: Payer: Self-pay | Admitting: Nurse Practitioner

## 2016-05-12 ENCOUNTER — Telehealth: Payer: Self-pay | Admitting: *Deleted

## 2016-05-12 NOTE — Telephone Encounter (Signed)
Fax from [employer] approving gabapentin capsules from 05/06/16-05/06/17. Approval letter scanned into chart.

## 2016-05-13 ENCOUNTER — Other Ambulatory Visit: Payer: Self-pay | Admitting: Nurse Practitioner

## 2016-05-13 MED ORDER — INSULIN LISPRO 100 UNIT/ML ~~LOC~~ SOLN: SUBCUTANEOUS | 5 refills | Status: DC

## 2016-05-20 ENCOUNTER — Telehealth: Payer: Self-pay | Admitting: Nurse Practitioner

## 2016-05-20 MED ORDER — ONDANSETRON 4 MG PO TBDP: ORAL_TABLET | ORAL | 0 refills | Status: DC

## 2016-05-20 NOTE — Telephone Encounter (Signed)
Patient is having uncontrollable vomiting and diarrhea.  She is requesting something to be called in to help with the vomiting.   [facility] [place]

## 2016-05-20 NOTE — Telephone Encounter (Signed)
Pt.notified

## 2016-05-20 NOTE — Telephone Encounter (Signed)
zofran 4 odt 20 one q six hrs prn nausea

## 2016-05-20 NOTE — Telephone Encounter (Signed)
Left message return call 05/20/16 

## 2016-05-27 ENCOUNTER — Ambulatory Visit: Payer: BLUE CROSS/BLUE SHIELD | Admitting: Family Medicine

## 2016-05-28 ENCOUNTER — Encounter (HOSPITAL_COMMUNITY): Payer: Self-pay | Admitting: Emergency Medicine

## 2016-05-28 ENCOUNTER — Emergency Department (HOSPITAL_COMMUNITY): Payer: Managed Care, Other (non HMO)

## 2016-05-28 ENCOUNTER — Emergency Department (HOSPITAL_COMMUNITY)
Admission: EM | Admit: 2016-05-28 | Discharge: 2016-05-28 | Disposition: A | Discharge status: Home / Self Care | Payer: Managed Care, Other (non HMO) | Attending: Emergency Medicine | Admitting: Emergency Medicine

## 2016-05-28 DIAGNOSIS — E109 Type 1 diabetes mellitus without complications: Secondary | ICD-10-CM | POA: Insufficient documentation

## 2016-05-28 DIAGNOSIS — J40 Bronchitis, not specified as acute or chronic: Secondary | ICD-10-CM | POA: Diagnosis not present

## 2016-05-28 DIAGNOSIS — R05 Cough: Secondary | ICD-10-CM | POA: Diagnosis present

## 2016-05-28 LAB — CBC WITH DIFFERENTIAL/PLATELET
BASOS ABS: 0 10*3/uL (ref 0.0–0.1)
BASOS PCT: 1 %
Eosinophils Absolute: 0 10*3/uL (ref 0.0–0.7)
Eosinophils Relative: 0 %
HEMATOCRIT: 36.8 % (ref 36.0–46.0)
HEMOGLOBIN: 12.7 g/dL (ref 12.0–15.0)
LYMPHS PCT: 24 %
Lymphs Abs: 1.5 10*3/uL (ref 0.7–4.0)
MCH: 30.5 pg (ref 26.0–34.0)
MCHC: 34.5 g/dL (ref 30.0–36.0)
MCV: 88.5 fL (ref 78.0–100.0)
Monocytes Absolute: 0.7 10*3/uL (ref 0.1–1.0)
Monocytes Relative: 12 %
NEUTROS ABS: 4 10*3/uL (ref 1.7–7.7)
NEUTROS PCT: 63 %
Platelets: 218 10*3/uL (ref 150–400)
RBC: 4.16 MIL/uL (ref 3.87–5.11)
RDW: 12.7 % (ref 11.5–15.5)
WBC: 6.2 10*3/uL (ref 4.0–10.5)

## 2016-05-28 LAB — COMPREHENSIVE METABOLIC PANEL
ALT: 20 U/L (ref 14–54)
AST: 22 U/L (ref 15–41)
Albumin: 3.3 g/dL — ABNORMAL LOW (ref 3.5–5.0)
Alkaline Phosphatase: 96 U/L (ref 38–126)
Anion gap: 11 (ref 5–15)
BILIRUBIN TOTAL: 0.7 mg/dL (ref 0.3–1.2)
BUN: 12 mg/dL (ref 6–20)
CALCIUM: 8.4 mg/dL — AB (ref 8.9–10.3)
CO2: 22 mmol/L (ref 22–32)
CREATININE: 0.82 mg/dL (ref 0.44–1.00)
Chloride: 99 mmol/L — ABNORMAL LOW (ref 101–111)
GFR calc Af Amer: >60 mL/min (ref >60)
Glucose, Bld: 477 mg/dL — ABNORMAL HIGH (ref 65–99)
POTASSIUM: 3.8 mmol/L (ref 3.5–5.1)
Sodium: 132 mmol/L — ABNORMAL LOW (ref 135–145)
TOTAL PROTEIN: 6.7 g/dL (ref 6.5–8.1)

## 2016-05-28 LAB — CBG MONITORING, ED
GLUCOSE-CAPILLARY: 449 mg/dL — AB (ref 65–99)
Glucose-Capillary: 341 mg/dL — ABNORMAL HIGH (ref 65–99)

## 2016-05-28 MED ORDER — SODIUM CHLORIDE 0.9 % IV BOLUS (SEPSIS)
1000.0000 mL | Freq: Once | INTRAVENOUS | Status: AC
  Administered 2016-05-28: 1000 mL via INTRAVENOUS

## 2016-05-28 MED ORDER — SODIUM CHLORIDE 0.9 % IV BOLUS (SEPSIS)
1000.0000 mL | Freq: Once | INTRAVENOUS | Status: AC
Start: 2016-05-28 — End: 2016-05-28
  Administered 2016-05-28: 1000 mL via INTRAVENOUS

## 2016-05-28 MED ORDER — OSELTAMIVIR PHOSPHATE 75 MG PO CAPS: 75.0000 mg | ORAL_CAPSULE | Freq: Two times a day (BID) | ORAL | 0 refills | Status: DC

## 2016-05-28 MED ORDER — INSULIN ASPART 100 UNIT/ML ~~LOC~~ SOLN
10.0000 [IU] | Freq: Once | SUBCUTANEOUS | Status: AC
  Administered 2016-05-28: 10 [IU] via SUBCUTANEOUS
  Filled 2016-05-28: qty 1

## 2016-05-28 MED ORDER — KETOROLAC TROMETHAMINE 30 MG/ML IJ SOLN
30.0000 mg | Freq: Once | INTRAMUSCULAR | Status: AC
  Administered 2016-05-28: 30 mg via INTRAVENOUS
  Filled 2016-05-28: qty 1

## 2016-05-28 MED ORDER — ONDANSETRON HCL 4 MG/2ML IJ SOLN
4.0000 mg | Freq: Once | INTRAMUSCULAR | Status: AC
  Administered 2016-05-28: 4 mg via INTRAVENOUS
  Filled 2016-05-28: qty 2

## 2016-05-28 MED ORDER — AZITHROMYCIN 250 MG PO TABS: ORAL_TABLET | ORAL | 0 refills | Status: DC

## 2016-05-28 NOTE — Discharge Instructions (Signed)
Drink plenty of fluids take Tylenol or Motrin for aches and pains. Follow next week if not improving

## 2016-05-28 NOTE — ED Notes (Signed)
Pt taken to xray 

## 2016-05-28 NOTE — ED Triage Notes (Signed)
Started with cold symptoms yesterday.  C/o cough (greenish blood-tinged), body aches, headache (10/10), n/v/d.  Vomited 3 times in last 24 hours and 8 watery stools in last 24 ours.  Have not vomited th is am and only had one watery stools today.  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  Took TheraFlu this am.  Pt is type 1 diabetic.

## 2016-05-28 NOTE — ED Provider Notes (Signed)
[facility]-EMERGENCY DEPT Provider Note   CSN: [id number] Arrival date & time: 05/28/16  0707     History   Chief Complaint Chief Complaint  Patient presents with  . Chills    HPI Kimberly Becker is a 36 y.o. female.  Patient complains of cough fever a green sputum production.   The history is provided by the patient.  Cough  This is a new problem. The current episode started 12 to 24 hours ago. The problem occurs constantly. The problem has not changed since onset.The cough is productive of sputum. The maximum temperature recorded prior to her arrival was 100 to 100.9 F. Pertinent negatives include no chest pain and no headaches. She is not a smoker.    Past Medical History:  Diagnosis Date  . Anxiety   . History of chickenpox   . Migraine headache   . PCOS (polycystic ovarian syndrome)   . Type I (juvenile type) diabetes mellitus without mention of complication, not stated as uncontrolled     Patient Active Problem List   Diagnosis Date Noted  . Diabetic neuropathy, painful (HCC) 02/14/2015  . Type I (juvenile type) diabetes mellitus without mention of complication, not stated as uncontrolled 09/26/2014  . Anxiety 06/26/2013  . Hyperlipidemia 03/20/2013  . Morbid obesity (HCC) 11/08/2012  . Migraines 08/10/2012  . Type 1 diabetes (HCC) 11/12/2010  . PCO (polycystic ovaries) 11/12/2010    Past Surgical History:  Procedure Laterality Date  . APPENDECTOMY  1992  . BREAST BIOPSY  2012   benign    OB History    Gravida Para Term Preterm AB Living   1             SAB TAB Ectopic Multiple Live Births                   Home Medications    Prior to Admission medications   Medication Sig Start Date End Date Taking? Authorizing Provider  ACCU-CHEK AVIVA PLUS test strip USE ONE STRIP TO CHECK GLUCOSE UP TO 7 TIMES DAILY   Yes Campbell Richesarolyn C Hoskins, NP  ALPRAZolam Prudy Feeler(XANAX) 0.5 MG tablet TAKE ONE TABLET BY MOUTH AT BEDTIME AS NEEDED 03/11/16  Yes Merlyn AlbertWilliam S Luking, MD    B-D ULTRAFINE III SHORT PEN 31G X 8 MM MISC USE AS DIRECTED 01/22/14  Yes Merlyn AlbertWilliam S Luking, MD  fish oil-omega-3 fatty acids 1000 MG capsule Take 1 g by mouth daily.     Yes Historical Provider, MD  gabapentin (NEURONTIN) 100 MG capsule TAKE THREE CAPSULES BY MOUTH THREE TIMES DAILY 03/24/16  Yes Merlyn AlbertWilliam S Luking, MD  insulin glargine (LANTUS) 100 UNIT/ML injection Inject up to 50 units as directed at qhs 12/30/15  Yes Campbell Richesarolyn C Hoskins, NP  insulin lispro (HUMALOG) 100 UNIT/ML injection Inject up to 20 units QID per sliding scale 05/13/16  Yes Campbell Richesarolyn C Hoskins, NP  amoxicillin-clavulanate (AUGMENTIN) 875-125 MG tablet Take 1 tablet by mouth 2 (two) times daily. Patient not taking: Reported on 05/28/2016 03/13/16   Campbell Richesarolyn C Hoskins, NP  azithromycin (ZITHROMAX Z-PAK) 250 MG tablet 2 po day one, then 1 daily x 4 days 05/28/16   Bethann BerkshireJoseph Amneet Cendejas, MD  ondansetron (ZOFRAN ODT) 4 MG disintegrating tablet Take 1 tablet by mouth every 6 hours as needed for nausea. Patient not taking: Reported on 05/28/2016 05/20/16   Merlyn AlbertWilliam S Luking, MD  oseltamivir (TAMIFLU) 75 MG capsule Take 1 capsule (75 mg total) by mouth every 12 (twelve) hours. 05/28/16   Jomarie LongsJoseph  Estell Harpin, MD    Family History Family History  Problem Relation Age of Onset  . Cancer Mother     Ovarian Cancer  . Heart disease Mother   . Heart disease Maternal Grandfather   . Diabetes Maternal Grandfather   . Heart disease Maternal Grandmother 42    first MI at age 56; second at 4  . Cancer Paternal Grandmother 3    breast cancer  . Cancer Cousin 59    before age 1; double mastectomy; breast CA  . Diabetes Paternal Grandfather   . Cancer Other     Breast Cancer-Aunt    Social History Social History  Substance Use Topics  . Smoking status: Never Smoker  . Smokeless tobacco: Never Used  . Alcohol use No     Allergies   Patient has no known allergies.   Review of Systems Review of Systems  Constitutional: Negative for appetite  change and fatigue.  HENT: Negative for congestion, ear discharge and sinus pressure.   Eyes: Negative for discharge.  Respiratory: Positive for cough.   Cardiovascular: Negative for chest pain.  Gastrointestinal: Negative for abdominal pain and diarrhea.  Genitourinary: Negative for frequency and hematuria.  Musculoskeletal: Negative for back pain.  Skin: Negative for rash.  Neurological: Negative for seizures and headaches.  Psychiatric/Behavioral: Negative for hallucinations.     Physical Exam Updated Vital Signs BP 132/69   Pulse 74   Temp 98.4 F (36.9 C) (Oral)   Resp 20   Ht 5\' 2"  (1.575 m)   Wt 210 lb (95.3 kg)   SpO2 97%   BMI 38.41 kg/m   Physical Exam  Constitutional: She is oriented to person, place, and time. She appears well-developed.  HENT:  Head: Normocephalic.  Eyes: Conjunctivae and EOM are normal. No scleral icterus.  Neck: Neck supple. No thyromegaly present.  Cardiovascular: Normal rate and regular rhythm.  Exam reveals no gallop and no friction rub.   No murmur heard. Pulmonary/Chest: No stridor. She has no wheezes. She has no rales. She exhibits no tenderness.  Abdominal: She exhibits no distension. There is no tenderness. There is no rebound.  Musculoskeletal: Normal range of motion. She exhibits no edema.  Lymphadenopathy:    She has no cervical adenopathy.  Neurological: She is oriented to person, place, and time. She exhibits normal muscle tone. Coordination normal.  Skin: No rash noted. No erythema.  Psychiatric: She has a normal mood and affect. Her behavior is normal.     ED Treatments / Results  Labs (all labs ordered are listed, but only abnormal results are displayed) Labs Reviewed  COMPREHENSIVE METABOLIC PANEL - Abnormal; Notable for the following:       Result Value   Sodium 132 (*)    Chloride 99 (*)    Glucose, Bld 477 (*)    Calcium 8.4 (*)    Albumin 3.3 (*)    All other components within normal limits  CBG MONITORING,  ED - Abnormal; Notable for the following:    Glucose-Capillary 449 (*)    All other components within normal limits  CBG MONITORING, ED - Abnormal; Notable for the following:    Glucose-Capillary 341 (*)    All other components within normal limits  CBC WITH DIFFERENTIAL/PLATELET    EKG  EKG Interpretation None       Radiology Dg Chest 2 View  Result Date: 05/28/2016 CLINICAL DATA:  Productive cough.  Fever. EXAM: CHEST  2 VIEW COMPARISON:  07/15/2014. FINDINGS: Mediastinum hilar structures  normal. Lungs are clear. No pleural effusion or pneumothorax. Heart size normal. No acute bony abnormality. IMPRESSION: No acute cardiopulmonary disease. Electronically Signed   By: Maisie Fus  Register   On: 05/28/2016 08:12    Procedures Procedures (including critical care time)  Medications Ordered in ED Medications  sodium chloride 0.9 % bolus 1,000 mL (0 mLs Intravenous Stopped 05/28/16 0907)  ketorolac (TORADOL) 30 MG/ML injection 30 mg (30 mg Intravenous Given 05/28/16 0817)  ondansetron (ZOFRAN) injection 4 mg (4 mg Intravenous Given 05/28/16 0817)  sodium chloride 0.9 % bolus 1,000 mL (1,000 mLs Intravenous New Bag/Given 05/28/16 0916)  insulin aspart (novoLOG) injection 10 Units (10 Units Subcutaneous Given 05/28/16 0915)     Initial Impression / Assessment and Plan / ED Course  I have reviewed the triage vital signs and the nursing notes.  Pertinent labs & imaging results that were available during my care of the patient were reviewed by me and considered in my medical decision making (see chart for details).     Patient's blood sugar was elevated. Patient was given fluids and insulin and her sugar came down. Chest x-ray unremarkable. Suspect influenza with bad bronchitis. Patient put on Tamiflu Z-Pak told to drink plenty of fluids given Tylenol and Motrin and will follow-up next week if needed Final Clinical Impressions(s) / ED Diagnoses   Final diagnoses:  Bronchitis    New  Prescriptions New Prescriptions   AZITHROMYCIN (ZITHROMAX Z-PAK) 250 MG TABLET    2 po day one, then 1 daily x 4 days   OSELTAMIVIR (TAMIFLU) 75 MG CAPSULE    Take 1 capsule (75 mg total) by mouth every 12 (twelve) hours.     Bethann Berkshire, MD 05/28/16 1055

## 2016-06-14 ENCOUNTER — Encounter (HOSPITAL_COMMUNITY): Payer: Self-pay | Admitting: Emergency Medicine

## 2016-06-14 ENCOUNTER — Emergency Department (HOSPITAL_COMMUNITY)
Admission: EM | Admit: 2016-06-14 | Discharge: 2016-06-14 | Disposition: A | Discharge status: Home Health | Payer: Worker's Compensation | Attending: Emergency Medicine | Admitting: Emergency Medicine

## 2016-06-14 DIAGNOSIS — X501XXA Overexertion from prolonged static or awkward postures, initial encounter: Secondary | ICD-10-CM | POA: Insufficient documentation

## 2016-06-14 DIAGNOSIS — Y929 Unspecified place or not applicable: Secondary | ICD-10-CM | POA: Diagnosis not present

## 2016-06-14 DIAGNOSIS — Y9389 Activity, other specified: Secondary | ICD-10-CM | POA: Insufficient documentation

## 2016-06-14 DIAGNOSIS — Z79899 Other long term (current) drug therapy: Secondary | ICD-10-CM | POA: Insufficient documentation

## 2016-06-14 DIAGNOSIS — Y999 Unspecified external cause status: Secondary | ICD-10-CM | POA: Insufficient documentation

## 2016-06-14 DIAGNOSIS — S3992XA Unspecified injury of lower back, initial encounter: Secondary | ICD-10-CM | POA: Diagnosis present

## 2016-06-14 DIAGNOSIS — S39012A Strain of muscle, fascia and tendon of lower back, initial encounter: Secondary | ICD-10-CM | POA: Diagnosis not present

## 2016-06-14 DIAGNOSIS — E109 Type 1 diabetes mellitus without complications: Secondary | ICD-10-CM | POA: Diagnosis not present

## 2016-06-14 MED ORDER — KETOROLAC TROMETHAMINE 60 MG/2ML IM SOLN
60.0000 mg | Freq: Once | INTRAMUSCULAR | Status: AC
  Administered 2016-06-14: 60 mg via INTRAMUSCULAR
  Filled 2016-06-14: qty 2

## 2016-06-14 MED ORDER — IBUPROFEN 800 MG PO TABS: 800.0000 mg | ORAL_TABLET | Freq: Three times a day (TID) | ORAL | 0 refills | Status: DC

## 2016-06-14 MED ORDER — METHOCARBAMOL 500 MG PO TABS: 500.0000 mg | ORAL_TABLET | Freq: Two times a day (BID) | ORAL | 0 refills | Status: DC | PRN

## 2016-06-14 NOTE — ED Triage Notes (Signed)
Pt reports lower left back pain after helping stand a pt up at work.  Pt works at [employer].  Pt is ambulatory.

## 2016-06-14 NOTE — ED Provider Notes (Signed)
[facility] Provider Note   CSN: [id number] Arrival date & time: 06/14/16  [id number]   By signing my name below, I, Talbert NanPaul Grant, attest that this documentation has been prepared under the direction and in the presence of Eber HongBrian Kipton Skillen, MD. Electronically Signed: Talbert NanPaul Grant, Scribe. 06/14/16. 8:56 AM.   History   Chief Complaint Chief Complaint  Patient presents with  . Back Pain    HPI HPI Comments: Kimberly A Idolina PrimerUnderwood is a 36 y.o. female who presents to the Emergency Department complaining of stabbing left sided lower back "flank" area that began within the last hour. Back pain began s/p work accident described as bending and twisting when picking up something. Pt has no hx of back pain. Pt has shooting pain down legs when standing. Pt is type 1 Dm. Pt denies fevers, iv drug use, numbness, weakness.  No hx of back pain - pain is worse with movement and position.     The history is provided by the patient. No language interpreter was used.    Past Medical History:  Diagnosis Date  . Anxiety   . History of chickenpox   . Migraine headache   . PCOS (polycystic ovarian syndrome)   . Type I (juvenile type) diabetes mellitus without mention of complication, not stated as uncontrolled     Patient Active Problem List   Diagnosis Date Noted  . Diabetic neuropathy, painful (HCC) 02/14/2015  . Type I (juvenile type) diabetes mellitus without mention of complication, not stated as uncontrolled 09/26/2014  . Anxiety 06/26/2013  . Hyperlipidemia 03/20/2013  . Morbid obesity (HCC) 11/08/2012  . Migraines 08/10/2012  . Type 1 diabetes (HCC) 11/12/2010  . PCO (polycystic ovaries) 11/12/2010    Past Surgical History:  Procedure Laterality Date  . APPENDECTOMY  1992  . BREAST BIOPSY  2012   benign    OB History    Gravida Para Term Preterm AB Living   1             SAB TAB Ectopic Multiple Live Births                   Home Medications    Prior to Admission medications    Medication Sig Start Date End Date Taking? Authorizing Provider  ACCU-CHEK AVIVA PLUS test strip USE ONE STRIP TO CHECK GLUCOSE UP TO 7 TIMES DAILY    Campbell Richesarolyn C Hoskins, NP  ALPRAZolam Prudy Feeler(XANAX) 0.5 MG tablet TAKE ONE TABLET BY MOUTH AT BEDTIME AS NEEDED 03/11/16   Merlyn AlbertWilliam S Luking, MD  B-D ULTRAFINE III SHORT PEN 31G X 8 MM MISC USE AS DIRECTED 01/22/14   Merlyn AlbertWilliam S Luking, MD  fish oil-omega-3 fatty acids 1000 MG capsule Take 1 g by mouth daily.      Historical Provider, MD  gabapentin (NEURONTIN) 100 MG capsule TAKE THREE CAPSULES BY MOUTH THREE TIMES DAILY 03/24/16   Merlyn AlbertWilliam S Luking, MD  ibuprofen (ADVIL,MOTRIN) 800 MG tablet Take 1 tablet (800 mg total) by mouth 3 (three) times daily. 06/14/16   Eber HongBrian Nailah Luepke, MD  insulin glargine (LANTUS) 100 UNIT/ML injection Inject up to 50 units as directed at qhs 12/30/15   Campbell Richesarolyn C Hoskins, NP  insulin lispro (HUMALOG) 100 UNIT/ML injection Inject up to 20 units QID per sliding scale 05/13/16   Campbell Richesarolyn C Hoskins, NP  methocarbamol (ROBAXIN) 500 MG tablet Take 1 tablet (500 mg total) by mouth 2 (two) times daily as needed for muscle spasms. 06/14/16   Eber HongBrian Rether Rison, MD  Family History Family History  Problem Relation Age of Onset  . Cancer Mother     Ovarian Cancer  . Heart disease Mother   . Heart disease Maternal Grandfather   . Diabetes Maternal Grandfather   . Heart disease Maternal Grandmother 9    first MI at age 37; second at 1  . Cancer Paternal Grandmother 24    breast cancer  . Cancer Cousin 84    before age 40; double mastectomy; breast CA  . Diabetes Paternal Grandfather   . Cancer Other     Breast Cancer-Aunt    Social History Social History  Substance Use Topics  . Smoking status: Never Smoker  . Smokeless tobacco: Never Used  . Alcohol use No     Allergies   Patient has no known allergies.   Review of Systems Review of Systems  Constitutional: Negative for fever.  Musculoskeletal: Positive for back pain.    Neurological: Negative for weakness and numbness.     Physical Exam Updated Vital Signs BP 133/76 (BP Location: Right Arm)   Pulse 97   Temp 98.2 F (36.8 C) (Oral)   Resp 22   Ht 5\' 3"  (1.6 m)   Wt 210 lb (95.3 kg)   SpO2 99%   BMI 37.20 kg/m   Physical Exam  Constitutional: She appears well-developed and well-nourished.  HENT:  Head: Normocephalic and atraumatic.  Eyes: Conjunctivae are normal. Right eye exhibits no discharge. Left eye exhibits no discharge.  Pulmonary/Chest: Effort normal. No respiratory distress.  Musculoskeletal:  Parspinal muscle tenderness from T8 through L2 of the left paraspinal area No spinal ttp.  Neurological: She is alert. Coordination normal.  Neurologic exam:  Speech clear, pupils equal round reactive to light, extraocular movements intact  Normal peripheral visual fields Cranial nerves III through XII normal including no facial droop Follows commands, moves all extremities x4, normal strength to bilateral upper and lower extremities at all major muscle groups including grip Sensation normal to light touch and pinprick Coordination intact, no limb ataxia, finger-nose-finger normal Rapid alternating movements normal No pronator drift Gait antalgic secondary to back pain  Skin: Skin is warm and dry. No rash noted. She is not diaphoretic. No erythema.  Psychiatric: She has a normal mood and affect.  Nursing note and vitals reviewed.    ED Treatments / Results   DIAGNOSTIC STUDIES: Oxygen Saturation is 99% on room air, normal by my interpretation.    COORDINATION OF CARE: 8:53 AM Discussed treatment plan with pt at bedside and pt agreed to plan. Bed rest for then next 24 hours and then gradual increase of range of activity thereafter. Pt will be given a shot of Toradol for inflammation.  Labs (all labs ordered are listed, but only abnormal results are displayed) Labs Reviewed - No data to display  Radiology No results  found.  Procedures Procedures (including critical care time)  Medications Ordered in ED Medications  ketorolac (TORADOL) injection 60 mg (not administered)     Initial Impression / Assessment and Plan / ED Course  I have reviewed the triage vital signs and the nursing notes.  Pertinent labs & imaging results that were available during my care of the patient were reviewed by me and considered in my medical decision making (see chart for details).   Back pain - no acute findings - no spinal ttp and no neuro sx.  Toradol, home with supportive meds 24 hours of rest,  Return precautions given, pt expressed understanding  Final  Clinical Impressions(s) / ED Diagnoses   Final diagnoses:  Strain of lumbar region, initial encounter    New Prescriptions New Prescriptions   IBUPROFEN (ADVIL,MOTRIN) 800 MG TABLET    Take 1 tablet (800 mg total) by mouth 3 (three) times daily.   METHOCARBAMOL (ROBAXIN) 500 MG TABLET    Take 1 tablet (500 mg total) by mouth 2 (two) times daily as needed for muscle spasms.   I personally performed the services described in this documentation, which was scribed in my presence. The recorded information has been reviewed and is accurate.        Eber Hong, MD 06/14/16 [phone]

## 2016-06-27 ENCOUNTER — Other Ambulatory Visit: Payer: Self-pay | Admitting: Family Medicine

## 2016-08-10 ENCOUNTER — Other Ambulatory Visit: Payer: Self-pay | Admitting: Nurse Practitioner

## 2016-08-23 ENCOUNTER — Encounter (HOSPITAL_COMMUNITY): Payer: Self-pay | Admitting: *Deleted

## 2016-08-23 ENCOUNTER — Emergency Department (HOSPITAL_COMMUNITY)
Admission: EM | Admit: 2016-08-23 | Discharge: 2016-08-23 | Disposition: A | Discharge status: Home / Self Care | Payer: Managed Care, Other (non HMO) | Attending: Emergency Medicine | Admitting: Emergency Medicine

## 2016-08-23 DIAGNOSIS — Z79899 Other long term (current) drug therapy: Secondary | ICD-10-CM | POA: Diagnosis not present

## 2016-08-23 DIAGNOSIS — R1032 Left lower quadrant pain: Secondary | ICD-10-CM | POA: Diagnosis not present

## 2016-08-23 DIAGNOSIS — R1031 Right lower quadrant pain: Secondary | ICD-10-CM | POA: Insufficient documentation

## 2016-08-23 DIAGNOSIS — Z794 Long term (current) use of insulin: Secondary | ICD-10-CM | POA: Insufficient documentation

## 2016-08-23 DIAGNOSIS — E860 Dehydration: Secondary | ICD-10-CM | POA: Insufficient documentation

## 2016-08-23 DIAGNOSIS — R112 Nausea with vomiting, unspecified: Secondary | ICD-10-CM | POA: Diagnosis present

## 2016-08-23 DIAGNOSIS — E109 Type 1 diabetes mellitus without complications: Secondary | ICD-10-CM | POA: Diagnosis not present

## 2016-08-23 LAB — COMPREHENSIVE METABOLIC PANEL
ALT: 30 U/L (ref 14–54)
ANION GAP: 10 (ref 5–15)
AST: 40 U/L (ref 15–41)
Albumin: 3.8 g/dL (ref 3.5–5.0)
Alkaline Phosphatase: 84 U/L (ref 38–126)
BUN: 20 mg/dL (ref 6–20)
CO2: 21 mmol/L — AB (ref 22–32)
CREATININE: 0.91 mg/dL (ref 0.44–1.00)
Calcium: 9 mg/dL (ref 8.9–10.3)
Chloride: 104 mmol/L (ref 101–111)
GFR calc Af Amer: >60 mL/min (ref >60)
Glucose, Bld: 254 mg/dL — ABNORMAL HIGH (ref 65–99)
POTASSIUM: 3.5 mmol/L (ref 3.5–5.1)
SODIUM: 135 mmol/L (ref 135–145)
Total Bilirubin: 0.6 mg/dL (ref 0.3–1.2)
Total Protein: 6.8 g/dL (ref 6.5–8.1)

## 2016-08-23 LAB — CBC
HEMATOCRIT: 35.1 % — AB (ref 36.0–46.0)
HEMOGLOBIN: 12 g/dL (ref 12.0–15.0)
MCH: 30.8 pg (ref 26.0–34.0)
MCHC: 34.2 g/dL (ref 30.0–36.0)
MCV: 90.2 fL (ref 78.0–100.0)
PLATELETS: 300 10*3/uL (ref 150–400)
RBC: 3.89 MIL/uL (ref 3.87–5.11)
RDW: 13 % (ref 11.5–15.5)
WBC: 10.6 10*3/uL — AB (ref 4.0–10.5)

## 2016-08-23 LAB — URINALYSIS, ROUTINE W REFLEX MICROSCOPIC
BILIRUBIN URINE: NEGATIVE
Glucose, UA: >=500 mg/dL — AB
Hgb urine dipstick: NEGATIVE
Ketones, ur: 20 mg/dL — AB
LEUKOCYTES UA: NEGATIVE
Nitrite: NEGATIVE
PH: 5 (ref 5.0–8.0)
Protein, ur: NEGATIVE mg/dL
SPECIFIC GRAVITY, URINE: 1.025 (ref 1.005–1.030)

## 2016-08-23 LAB — LIPASE, BLOOD: LIPASE: 21 U/L (ref 11–51)

## 2016-08-23 LAB — POC URINE PREG, ED: Preg Test, Ur: NEGATIVE

## 2016-08-23 LAB — CBG MONITORING, ED: GLUCOSE-CAPILLARY: 228 mg/dL — AB (ref 65–99)

## 2016-08-23 MED ORDER — ONDANSETRON HCL 4 MG/2ML IJ SOLN
4.0000 mg | Freq: Once | INTRAMUSCULAR | Status: AC
  Administered 2016-08-23: 4 mg via INTRAVENOUS
  Filled 2016-08-23: qty 2

## 2016-08-23 MED ORDER — SODIUM CHLORIDE 0.9 % IV BOLUS (SEPSIS)
1000.0000 mL | Freq: Once | INTRAVENOUS | Status: AC
  Administered 2016-08-23: 1000 mL via INTRAVENOUS

## 2016-08-23 MED ORDER — ONDANSETRON HCL 4 MG PO TABS: 4.0000 mg | ORAL_TABLET | Freq: Three times a day (TID) | ORAL | 0 refills | Status: DC | PRN

## 2016-08-23 NOTE — ED Triage Notes (Signed)
Pt c/o cramping abdominal pain and vomiting that started this morning around 0330. Pt is type 1 diabetic with blood sugars going up and down. Pt's CBG when waking was 98, then dropped to 52 and was 110 when last checked after correcting with glucose paste. Pt took Lantus last night but no sliding scale insulin this morning. Denies fever. Diarrhea reported last night, but none today. Pt has been around others with stomach virus recently.

## 2016-08-23 NOTE — ED Provider Notes (Signed)
[facility]-EMERGENCY DEPT Provider Note   CSN: [id number] Arrival date & time: 08/23/16  1102   By signing my name below, I, [name], attest that this documentation has been prepared under the direction and in the presence of Manali Mcelmurry, Barbara Cower, MD. Electronically Signed: [name], Scribe. 08/23/16. 11:35 AM. History   Chief Complaint Chief Complaint  Patient presents with  . Abdominal Pain  . Emesis    The history is provided by the patient. No language interpreter was used.  HPI Comments: Kimberly Becker Kimberly Becker is a 36 y.o. female with hx of diabetes who presents to the Emergency Department complaining of 4 episodes of emesis since around 3 am this morning. She describes the pain as a "cramping" sensation. She also reports having and episode of diarrhea last night but none today. She states that she went to work today and had a couple episodes of emesis. A co-worker check her CBG and it was noted to be 98. It was checked again and her CBG went down to 52. She was given glucose paste under her tongue with an improvement of her CBG to 110. Patient states that she took lantus last night but no sliding scale insulin this morning. She states that she has been around people at work that have had stomach viruses recently. She denies fevers, rashes, blood in diarrhea, blood in vomiting, or any urinary symptoms.   Past Medical History:  Diagnosis Date  . Anxiety   . History of chickenpox   . Migraine headache   . PCOS (polycystic ovarian syndrome)   . Type I (juvenile type) diabetes mellitus without mention of complication, not stated as uncontrolled     Patient Active Problem List   Diagnosis Date Noted  . Diabetic neuropathy, painful (HCC) 02/14/2015  . Type I (juvenile type) diabetes mellitus without mention of complication, not stated as uncontrolled 09/26/2014  . Anxiety 06/26/2013  . Hyperlipidemia 03/20/2013  . Morbid obesity (HCC) 11/08/2012  . Migraines 08/10/2012  . Type 1  diabetes (HCC) 11/12/2010  . PCO (polycystic ovaries) 11/12/2010    Past Surgical History:  Procedure Laterality Date  . APPENDECTOMY  1992  . BREAST BIOPSY  2012   benign    OB History    Gravida Para Term Preterm AB Living   1             SAB TAB Ectopic Multiple Live Births                   Home Medications    Prior to Admission medications   Medication Sig Start Date End Date Taking? Authorizing Provider  ALPRAZolam (XANAX) 0.5 MG tablet TAKE ONE TABLET BY MOUTH AT BEDTIME AS NEEDED Patient taking differently: Take 1 tablet by mouth at bedtime as needed for anxiety and/or sleep 03/11/16  Yes Merlyn Albert, MD  gabapentin (NEURONTIN) 100 MG capsule Take 300 mg by mouth 3 (three) times daily.   Yes [provider]  insulin glargine (LANTUS) 100 UNIT/ML injection Inject 0-20 Units into the skin at bedtime. Pt uses as needed per sliding scale.  BS less than 120:  No insulin   Yes [provider]  insulin lispro (HUMALOG) 100 UNIT/ML injection Inject 0-20 Units into the skin 4 (four) times daily -  before meals and at bedtime. Pt uses as needed per sliding scale.   Yes [provider]  Multiple Vitamin (MULTIVITAMIN WITH MINERALS) TABS tablet Take 1 tablet by mouth daily.   Yes [provider]  omega-3 acid ethyl esters (LOVAZA) 1 g capsule Take 1 g by mouth daily.   Yes [provider]  ondansetron (ZOFRAN) 4 MG tablet Take 1 tablet (4 mg total) by mouth every 8 (eight) hours as needed for nausea or vomiting. 08/23/16   Rayshun Kandler, Barbara CowerJason, MD    Family History Family History  Problem Relation Age of Onset  . Cancer Mother        Ovarian Cancer  . Heart disease Mother   . Heart disease Maternal Grandfather   . Diabetes Maternal Grandfather   . Heart disease Maternal Grandmother [id number]       first MI at age 37; second at 4867  . Cancer Paternal Grandmother 3360       breast cancer  . Cancer Cousin 1240       before age 37; double  mastectomy; breast CA  . Diabetes Paternal Grandfather   . Cancer Other        Breast Cancer-Aunt    Social History Social History  Substance Use Topics  . Smoking status: Never Smoker  . Smokeless tobacco: Never Used  . Alcohol use No     Allergies   Patient has no known allergies.   Review of Systems Review of Systems  Constitutional: Negative for fever.  Gastrointestinal: Positive for abdominal pain, diarrhea, nausea and vomiting.  Genitourinary: Negative for difficulty urinating, dysuria, frequency and vaginal discharge.  All other systems reviewed and are negative.   Physical Exam Updated Vital Signs BP 124/62   Pulse 78   Temp 98.3 F (36.8 C)   Resp 18   Ht 5\' 2"  (1.575 m)   Wt 220 lb (99.8 kg)   LMP  (Within Months)   SpO2 98%   BMI 40.24 kg/m   Physical Exam  Constitutional: She is oriented to person, place, and time. She appears well-developed and well-nourished.  HENT:  Head: Normocephalic.  Eyes: EOM are normal.  Neck: Normal range of motion.  Pulmonary/Chest: Effort normal.  Abdominal: Soft. She exhibits no distension.  Soft, bilateral suprapubic tenderness without rebound or guarding.   Musculoskeletal: Normal range of motion.  Neurological: She is alert and oriented to person, place, and time.  Psychiatric: She has a normal mood and affect.  Nursing note and vitals reviewed.  ED Treatments / Results  DIAGNOSTIC STUDIES: Oxygen Saturation is 99% on RA, normal by my interpretation.    COORDINATION OF CARE: 11:18 AM Discussed treatment plan with pt at bedside and pt agreed to plan. I will check her CT scan.  Labs (all labs ordered are listed, but only abnormal results are displayed) Labs Reviewed  COMPREHENSIVE METABOLIC PANEL - Abnormal; Notable for the following:       Result Value   CO2 21 (*)    Glucose, Bld 254 (*)    All other components within normal limits  CBC - Abnormal; Notable for the following:    WBC 10.6 (*)    HCT  35.1 (*)    All other components within normal limits  URINALYSIS, ROUTINE W REFLEX MICROSCOPIC - Abnormal; Notable for the following:    APPearance HAZY (*)    Glucose, UA >=500 (*)    Ketones, ur 20 (*)    Bacteria, UA RARE (*)    Squamous Epithelial / LPF 0-5 (*)    All other components within normal limits  CBG MONITORING, ED - Abnormal; Notable for the following:    Glucose-Capillary 228 (*)    All other  components within normal limits  LIPASE, BLOOD  POC URINE PREG, ED    EKG  EKG Interpretation None       Radiology No results found.  Procedures Procedures (including critical care time)  Medications Ordered in ED Medications  ondansetron (ZOFRAN) injection 4 mg (4 mg Intravenous Given 08/23/16 1157)  sodium chloride 0.9 % bolus 1,000 mL (0 mLs Intravenous Stopped 08/23/16 1344)     Initial Impression / Assessment and Plan / ED Course  I have reviewed the triage vital signs and the nursing notes.  Pertinent labs & imaging results that were available during my care of the patient were reviewed by me and considered in my medical decision making (see chart for details).     No e/o DKA. Mild dehydration. Tolerating PO, glucose staying elevated. Likely GI bug. Doubt colitis, obstruction, volvulus or more imminent causes for her symptoms. Stable for dc with po fluids/zofran for next couple days.   Final Clinical Impressions(s) / ED Diagnoses   Final diagnoses:  Non-intractable vomiting with nausea, unspecified vomiting type    New Prescriptions Discharge Medication List as of 08/23/2016  1:17 PM    START taking these medications   Details  ondansetron (ZOFRAN) 4 MG tablet Take 1 tablet (4 mg total) by mouth every 8 (eight) hours as needed for nausea or vomiting., Starting Sun 08/23/2016, Print       I personally performed the services described in this documentation, which was scribed in my presence. The recorded information has been reviewed and is  accurate.    Turner Kunzman, Barbara Cower, MD 08/24/16 [id number]

## 2016-08-30 ENCOUNTER — Encounter: Payer: Self-pay | Admitting: Nurse Practitioner

## 2016-08-31 ENCOUNTER — Other Ambulatory Visit: Payer: Self-pay | Admitting: Nurse Practitioner

## 2016-08-31 MED ORDER — FLUCONAZOLE 150 MG PO TABS: ORAL_TABLET | ORAL | 0 refills | Status: DC

## 2016-09-10 ENCOUNTER — Other Ambulatory Visit: Payer: Self-pay | Admitting: Family Medicine

## 2016-09-15 ENCOUNTER — Telehealth: Payer: Self-pay | Admitting: Family Medicine

## 2016-09-15 NOTE — Telephone Encounter (Signed)
Patient is going to need Rx for insulin sent to her new mail order pharmacy.  She said she will need a refill around 6/12-6/13.  [facility] p [phone]

## 2016-09-15 NOTE — Telephone Encounter (Signed)
Last diabetic check up 12/2015-patient past due check up

## 2016-09-16 ENCOUNTER — Other Ambulatory Visit: Payer: Self-pay | Admitting: *Deleted

## 2016-09-16 MED ORDER — INSULIN LISPRO 100 UNIT/ML ~~LOC~~ SOLN: 0.0000 [IU] | Freq: Three times a day (TID) | SUBCUTANEOUS | 0 refills | Status: DC

## 2016-09-16 MED ORDER — INSULIN GLARGINE 100 UNIT/ML ~~LOC~~ SOLN: 0.0000 [IU] | Freq: Every day | SUBCUTANEOUS | 0 refills | Status: DC

## 2016-09-16 NOTE — Telephone Encounter (Signed)
Discussed with pt. Pt transferred to front to schedule diabetic check up within next 30 days. Refills sent to pharm

## 2016-09-16 NOTE — Telephone Encounter (Signed)
Please give one refill on insulins. She needs appointment within 30 days for diabetes check up.

## 2016-09-22 ENCOUNTER — Telehealth: Payer: Self-pay | Admitting: Family Medicine

## 2016-09-22 NOTE — Telephone Encounter (Signed)
Pt is needing 90 day supplies sent in for lantus and Humalog due to her insurance requiring her to use their company. Pt was only sent in a one month supply. Can we get this fixed.   [facility] [place]- [place], [place] - [id number]

## 2016-09-22 NOTE — Telephone Encounter (Signed)
Left message return call 09/22/16 

## 2016-09-22 NOTE — Telephone Encounter (Signed)
Discussed with pt that 30 days supply was sent in because she was overdue for check up. She has appt tomorrow for check up and has enough to last til then.

## 2016-09-23 ENCOUNTER — Ambulatory Visit (INDEPENDENT_AMBULATORY_CARE_PROVIDER_SITE_OTHER): Payer: No Typology Code available for payment source | Admitting: Family Medicine

## 2016-09-23 ENCOUNTER — Encounter: Payer: Self-pay | Admitting: Family Medicine

## 2016-09-23 VITALS — BP 122/80 | Ht 62.0 in | Wt 221.0 lb

## 2016-09-23 DIAGNOSIS — G5603 Carpal tunnel syndrome, bilateral upper limbs: Secondary | ICD-10-CM | POA: Diagnosis not present

## 2016-09-23 DIAGNOSIS — E119 Type 2 diabetes mellitus without complications: Secondary | ICD-10-CM

## 2016-09-23 DIAGNOSIS — F5101 Primary insomnia: Secondary | ICD-10-CM

## 2016-09-23 DIAGNOSIS — E109 Type 1 diabetes mellitus without complications: Secondary | ICD-10-CM

## 2016-09-23 DIAGNOSIS — E1140 Type 2 diabetes mellitus with diabetic neuropathy, unspecified: Secondary | ICD-10-CM

## 2016-09-23 LAB — POCT GLYCOSYLATED HEMOGLOBIN (HGB A1C): HEMOGLOBIN A1C: 6.6

## 2016-09-23 MED ORDER — GABAPENTIN 100 MG PO CAPS: 300.0000 mg | ORAL_CAPSULE | Freq: Three times a day (TID) | ORAL | 1 refills | Status: DC

## 2016-09-23 MED ORDER — INSULIN GLARGINE 100 UNIT/ML ~~LOC~~ SOLN: 0.0000 [IU] | Freq: Every day | SUBCUTANEOUS | 1 refills | Status: DC

## 2016-09-23 MED ORDER — INSULIN LISPRO 100 UNIT/ML ~~LOC~~ SOLN: 0.0000 [IU] | Freq: Three times a day (TID) | SUBCUTANEOUS | 1 refills | Status: DC

## 2016-09-23 NOTE — Patient Instructions (Signed)
Get short wrist velcro splints and wear every nightCarpal Tunnel Syndrome Carpal tunnel syndrome is a condition that causes pain in your hand and arm. The carpal tunnel is a narrow area located on the palm side of your wrist. Repeated wrist motion or certain diseases may cause swelling within the tunnel. This swelling pinches the main nerve in the wrist (median nerve). What are the causes? This condition may be caused by:  Repeated wrist motions.  Wrist injuries.  Arthritis.  A cyst or tumor in the carpal tunnel.  Fluid buildup during pregnancy.  Sometimes the cause of this condition is not known. What increases the risk? This condition is more likely to develop in:  People who have jobs that cause them to repeatedly move their wrists in the same motion, such as Health visitorbutchers and cashiers.  Women.  People with certain conditions, such as: ? Diabetes. ? Obesity. ? An underactive thyroid (hypothyroidism). ? Kidney failure.  What are the signs or symptoms? Symptoms of this condition include:  A tingling feeling in your fingers, especially in your thumb, index, and middle fingers.  Tingling or numbness in your hand.  An aching feeling in your entire arm, especially when your wrist and elbow are bent for long periods of time.  Wrist pain that goes up your arm to your shoulder.  Pain that goes down into your palm or fingers.  A weak feeling in your hands. You may have trouble grabbing and holding items.  Your symptoms may feel worse during the night. How is this diagnosed? This condition is diagnosed with a medical history and physical exam. You may also have tests, including:  An electromyogram (EMG). This test measures electrical signals sent by your nerves into the muscles.  X-rays.  How is this treated? Treatment for this condition includes:  Lifestyle changes. It is important to stop doing or modify the activity that caused your condition.  Physical or occupational  therapy.  Medicines for pain and inflammation. This may include medicine that is injected into your wrist.  A wrist splint.  Surgery.  Follow these instructions at home: If you have a splint:  Wear it as told by your health care provider. Remove it only as told by your health care provider.  Loosen the splint if your fingers become numb and tingle, or if they turn cold and blue.  Keep the splint clean and dry. General instructions  Take over-the-counter and prescription medicines only as told by your health care provider.  Rest your wrist from any activity that may be causing your pain. If your condition is work related, talk to your employer about changes that can be made, such as getting a wrist pad to use while typing.  If directed, apply ice to the painful area: ? Put ice in a plastic bag. ? Place a towel between your skin and the bag. ? Leave the ice on for 20 minutes, 2-3 times per day.  Keep all follow-up visits as told by your health care provider. This is important.  Do any exercises as told by your health care provider, physical therapist, or occupational therapist. Contact a health care provider if:  You have new symptoms.  Your pain is not controlled with medicines.  Your symptoms get worse. This information is not intended to replace advice given to you by your health care provider. Make sure you discuss any questions you have with your health care provider. Document Released: 03/27/2000 Document Revised: 08/08/2015 Document Reviewed: 08/15/2014 Elsevier Interactive Patient  Education  2017 [employer]vier Inc.  

## 2016-09-23 NOTE — Progress Notes (Signed)
   Subjective:    Patient ID: Kimberly Becker, female    DOB: 06-21-1980, 36 y.o.   MRN: [id number] Patient presents with numerous concerns Diabetes  She presents for her follow-up diabetic visit. She has type 1 diabetes mellitus. She is compliant with treatment all of the time. Eye exam is not current.   Shooting pain in both hands. Tried gabapentin. Gabapentin helped feet but not hands.hands still hurt, gabapentin has not helped     Results for orders placed or performed in visit on 09/23/16  POCT glycosylated hemoglobin (Hb A1C)  Result Value Ref Range   Hemoglobin A1C 6.6    Patient claims compliance with diabetes medication. No obvious side effects. Reports no substantial low sugar spells. Most numbers are generally in good range when checked fasting. Generally does not miss a dose of medication. Watching diabetic diet closely States overall sugars been running good.  History of painful diabetic neuropathy of the feet. Notes still occasional diminished sensation that pain overall in very good control with Neurontin. Notes Neurontin does not help the hand discomfort.  Patient compliant with insomnia medication. Generally takes most nights. No obvious morning drowsiness. Definitely helps patient sleep. Without it patient states would not get a good nights rest.  So so on exercise  Average on diet    coccas low sugar spells  Patient compliant with insomnia medication. Generally takes most nights. No obvious morning drowsiness. Definitely helps patient sleep. Without it patient states would not get a good nights rest.     Review of Systems No headache, no major weight loss or weight gain, no chest pain no back pain abdominal pain no change in bowel habits complete ROS otherwise negative     Objective:   Physical Exam  Alert and oriented, vitals reviewed and stable, NAD ENT-TM's and ext canals WNL bilat via otoscopic exam Soft palate, tonsils and post pharynx WNL via  oropharyngeal exam Neck-symmetric, no masses; thyroid nonpalpable and nontender Pulmonary-no tachypnea or accessory muscle use; Clear without wheezes via auscultation Card--no abnrml murmurs, rhythm reg and rate WNL Carotid pulses symmetric, without bruits  Positive Phalen's sign bilateral positive Tinel's sign bilateral      Assessment & Plan:  Impression 1 type 1 diabetes. A1c excellent. Eye Dr. visits pending. Patient has insurance again compliant with diet and exercise and insulin. Occasional rare low sugar spell #2 sensory neuropathy of the feet improved. Now not painful discussed #3 bilateral carpal tunnel syndrome diagnosis discussed at length bilateral splints recommended as an initial approach rationale discussed #4 insomnia stable on meds

## 2016-09-24 ENCOUNTER — Telehealth: Payer: Self-pay | Admitting: Family Medicine

## 2016-09-24 NOTE — Telephone Encounter (Signed)
Spoke with patient and informed her that pharmacy called and a verbal order was given to the pharmacy for medication. Patient verbalized understanding and stated that she will contact her pharmacy.

## 2016-09-24 NOTE — Telephone Encounter (Signed)
[facility] yesterday after she was seen.  She is needing a 90 day supply.

## 2016-10-10 ENCOUNTER — Other Ambulatory Visit: Payer: Self-pay | Admitting: Nurse Practitioner

## 2016-10-23 ENCOUNTER — Ambulatory Visit: Payer: BLUE CROSS/BLUE SHIELD | Admitting: Nurse Practitioner

## 2016-11-04 ENCOUNTER — Emergency Department (HOSPITAL_COMMUNITY): Payer: No Typology Code available for payment source

## 2016-11-04 ENCOUNTER — Emergency Department (HOSPITAL_COMMUNITY)
Admission: EM | Admit: 2016-11-04 | Discharge: 2016-11-04 | Disposition: A | Discharge status: Home / Self Care | Payer: No Typology Code available for payment source | Attending: Emergency Medicine | Admitting: Emergency Medicine

## 2016-11-04 ENCOUNTER — Encounter (HOSPITAL_COMMUNITY): Payer: Self-pay | Admitting: *Deleted

## 2016-11-04 DIAGNOSIS — Y999 Unspecified external cause status: Secondary | ICD-10-CM | POA: Insufficient documentation

## 2016-11-04 DIAGNOSIS — R079 Chest pain, unspecified: Secondary | ICD-10-CM | POA: Insufficient documentation

## 2016-11-04 DIAGNOSIS — R51 Headache: Secondary | ICD-10-CM | POA: Insufficient documentation

## 2016-11-04 DIAGNOSIS — Y9389 Activity, other specified: Secondary | ICD-10-CM | POA: Insufficient documentation

## 2016-11-04 DIAGNOSIS — Y92410 Unspecified street and highway as the place of occurrence of the external cause: Secondary | ICD-10-CM | POA: Diagnosis not present

## 2016-11-04 DIAGNOSIS — E1040 Type 1 diabetes mellitus with diabetic neuropathy, unspecified: Secondary | ICD-10-CM | POA: Diagnosis not present

## 2016-11-04 DIAGNOSIS — Z79899 Other long term (current) drug therapy: Secondary | ICD-10-CM | POA: Diagnosis not present

## 2016-11-04 DIAGNOSIS — R109 Unspecified abdominal pain: Secondary | ICD-10-CM | POA: Insufficient documentation

## 2016-11-04 LAB — CBC WITH DIFFERENTIAL/PLATELET
BASOS ABS: 0.1 10*3/uL (ref 0.0–0.1)
Basophils Relative: 1 %
EOS PCT: 2 %
Eosinophils Absolute: 0.2 10*3/uL (ref 0.0–0.7)
HEMATOCRIT: 39.8 % (ref 36.0–46.0)
HEMOGLOBIN: 13.4 g/dL (ref 12.0–15.0)
LYMPHS ABS: 1.7 10*3/uL (ref 0.7–4.0)
LYMPHS PCT: 14 %
MCH: 31 pg (ref 26.0–34.0)
MCHC: 33.7 g/dL (ref 30.0–36.0)
MCV: 92.1 fL (ref 78.0–100.0)
Monocytes Absolute: 1 10*3/uL (ref 0.1–1.0)
Monocytes Relative: 9 %
NEUTROS ABS: 8.9 10*3/uL — AB (ref 1.7–7.7)
NEUTROS PCT: 74 %
Platelets: 285 10*3/uL (ref 150–400)
RBC: 4.32 MIL/uL (ref 3.87–5.11)
RDW: 13.1 % (ref 11.5–15.5)
WBC: 11.9 10*3/uL — AB (ref 4.0–10.5)

## 2016-11-04 LAB — COMPREHENSIVE METABOLIC PANEL
ALK PHOS: 72 U/L (ref 38–126)
ALT: 18 U/L (ref 14–54)
AST: 19 U/L (ref 15–41)
Albumin: 3.9 g/dL (ref 3.5–5.0)
Anion gap: 10 (ref 5–15)
BUN: 15 mg/dL (ref 6–20)
CALCIUM: 9 mg/dL (ref 8.9–10.3)
CO2: 26 mmol/L (ref 22–32)
CREATININE: 0.73 mg/dL (ref 0.44–1.00)
Chloride: 98 mmol/L — ABNORMAL LOW (ref 101–111)
Glucose, Bld: 202 mg/dL — ABNORMAL HIGH (ref 65–99)
Potassium: 3 mmol/L — ABNORMAL LOW (ref 3.5–5.1)
Sodium: 134 mmol/L — ABNORMAL LOW (ref 135–145)
Total Bilirubin: 0.6 mg/dL (ref 0.3–1.2)
Total Protein: 7.5 g/dL (ref 6.5–8.1)

## 2016-11-04 LAB — CBG MONITORING, ED: GLUCOSE-CAPILLARY: 204 mg/dL — AB (ref 65–99)

## 2016-11-04 MED ORDER — TRAMADOL HCL 50 MG PO TABS: 50.0000 mg | ORAL_TABLET | Freq: Four times a day (QID) | ORAL | 0 refills | Status: DC | PRN

## 2016-11-04 MED ORDER — IOPAMIDOL (ISOVUE-300) INJECTION 61%
100.0000 mL | Freq: Once | INTRAVENOUS | Status: AC | PRN
  Administered 2016-11-04: 100 mL via INTRAVENOUS
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
## 2016-11-04 NOTE — Discharge Instructions (Signed)
Follow up with your md next week for recheck °
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
## 2017-01-14 ENCOUNTER — Other Ambulatory Visit: Payer: Self-pay | Admitting: Nurse Practitioner

## 2017-01-14 NOTE — Telephone Encounter (Signed)
Last seen 09/23/16.

## 2017-01-14 NOTE — Telephone Encounter (Signed)
Pt called to check on this  States she ordered this on Tuesday however pharmacy just sent it in to [place] today  States she will run out of this 01/15/17 afternoon  Pt would like to pick it up soon  Please advise

## 2017-04-22 ENCOUNTER — Other Ambulatory Visit: Payer: Self-pay | Admitting: Nurse Practitioner

## 2017-04-23 NOTE — Telephone Encounter (Signed)
May have this +2 refills 

## 2017-04-26 ENCOUNTER — Emergency Department (HOSPITAL_COMMUNITY)
Admission: EM | Admit: 2017-04-26 | Discharge: 2017-04-26 | Disposition: A | Discharge status: Home / Self Care | Payer: No Typology Code available for payment source | Attending: Emergency Medicine | Admitting: Emergency Medicine

## 2017-04-26 ENCOUNTER — Other Ambulatory Visit: Payer: Self-pay

## 2017-04-26 DIAGNOSIS — J029 Acute pharyngitis, unspecified: Secondary | ICD-10-CM | POA: Diagnosis not present

## 2017-04-26 DIAGNOSIS — Z794 Long term (current) use of insulin: Secondary | ICD-10-CM | POA: Diagnosis not present

## 2017-04-26 DIAGNOSIS — E109 Type 1 diabetes mellitus without complications: Secondary | ICD-10-CM | POA: Insufficient documentation

## 2017-04-26 DIAGNOSIS — Z79899 Other long term (current) drug therapy: Secondary | ICD-10-CM | POA: Insufficient documentation

## 2017-04-26 DIAGNOSIS — H6693 Otitis media, unspecified, bilateral: Secondary | ICD-10-CM | POA: Insufficient documentation

## 2017-04-26 DIAGNOSIS — R509 Fever, unspecified: Secondary | ICD-10-CM | POA: Diagnosis present

## 2017-04-26 LAB — CBG MONITORING, ED: Glucose-Capillary: 69 mg/dL (ref 65–99)

## 2017-04-26 MED ORDER — AMOXICILLIN 250 MG PO CAPS
1000.0000 mg | ORAL_CAPSULE | Freq: Once | ORAL | Status: AC
  Administered 2017-04-26: 1000 mg via ORAL
  Filled 2017-04-26: qty 4

## 2017-04-26 MED ORDER — AMOXICILLIN 500 MG PO CAPS: 500.0000 mg | ORAL_CAPSULE | Freq: Three times a day (TID) | ORAL | 0 refills | Status: DC

## 2017-04-26 NOTE — ED Triage Notes (Signed)
Nasal congestion, cough and fever since Saturday. Last dose of tylenol at 0500 this morning.

## 2017-04-26 NOTE — ED Provider Notes (Signed)
[facility] Provider Note   CSN: [id number] Arrival date & time: 04/26/17  [id number]     History   Chief Complaint Chief Complaint  Patient presents with  . Nasal Congestion    HPI Kimberly Becker is a 37 y.o. female.  Fever, sore throat, earache, nasal congestion for several days.  Patient has taken Tylenol for fever.  She is a type I diabetic.  She is able to drink fluids.  No meningeal signs.  Severity of symptoms is moderate.      Past Medical History:  Diagnosis Date  . Anxiety   . History of chickenpox   . Migraine headache   . PCOS (polycystic ovarian syndrome)   . Type I (juvenile type) diabetes mellitus without mention of complication, not stated as uncontrolled     Patient Active Problem List   Diagnosis Date Noted  . Diabetic neuropathy, painful (HCC) 02/14/2015  . Type I (juvenile type) diabetes mellitus without mention of complication, not stated as uncontrolled 09/26/2014  . Anxiety 06/26/2013  . Hyperlipidemia 03/20/2013  . Morbid obesity (HCC) 11/08/2012  . Migraines 08/10/2012  . Type 1 diabetes (HCC) 11/12/2010  . PCO (polycystic ovaries) 11/12/2010    Past Surgical History:  Procedure Laterality Date  . APPENDECTOMY  1992  . BREAST BIOPSY  2012   benign    OB History    Gravida Para Term Preterm AB Living   1             SAB TAB Ectopic Multiple Live Births                   Home Medications    Prior to Admission medications   Medication Sig Start Date End Date Taking? Authorizing Provider  ALPRAZolam Prudy Feeler(XANAX) 0.5 MG tablet TAKE 1 TABLET BY MOUTH AT BEDTIME AS NEEDED 04/23/17   Merlyn AlbertLuking, William S, MD  amoxicillin (AMOXIL) 500 MG capsule Take 1 capsule (500 mg total) by mouth 3 (three) times daily. 04/26/17   Donnetta Hutchingook, Shay Bartoli, MD  gabapentin (NEURONTIN) 100 MG capsule Take 3 capsules (300 mg total) by mouth 3 (three) times daily. 09/23/16   Merlyn AlbertLuking, William S, MD  insulin glargine (LANTUS) 100 UNIT/ML injection Inject  0-0.2 mLs (0-20 Units total) into the skin at bedtime. Pt uses as needed per sliding scale.  BS less than 120:  No insulin 09/23/16   Merlyn AlbertLuking, William S, MD  insulin lispro (HUMALOG) 100 UNIT/ML injection Inject 0-0.2 mLs (0-20 Units total) into the skin 4 (four) times daily -  before meals and at bedtime. Pt uses as needed per sliding scale. 09/23/16   Merlyn AlbertLuking, William S, MD  Multiple Vitamin (MULTIVITAMIN WITH MINERALS) TABS tablet Take 1 tablet by mouth daily.    [provider]  omega-3 acid ethyl esters (LOVAZA) 1 g capsule Take 1 g by mouth daily.    [provider]  traMADol (ULTRAM) 50 MG tablet Take 1 tablet (50 mg total) by mouth every 6 (six) hours as needed. 11/04/16   Bethann BerkshireZammit, Joseph, MD    Family History Family History  Problem Relation Age of Onset  . Cancer Mother        Ovarian Cancer  . Heart disease Mother   . Heart disease Maternal Grandfather   . Diabetes Maternal Grandfather   . Heart disease Maternal Grandmother 3837       first MI at age 137; second at 3467  . Cancer Paternal Grandmother 2060  breast cancer  . Cancer Cousin 62       before age 75; double mastectomy; breast CA  . Diabetes Paternal Grandfather   . Cancer Other        Breast Cancer-Aunt    Social History Social History   Tobacco Use  . Smoking status: Never Smoker  . Smokeless tobacco: Never Used  Substance Use Topics  . Alcohol use: No  . Drug use: No     Allergies   Patient has no known allergies.   Review of Systems Review of Systems  All other systems reviewed and are negative.    Physical Exam Updated Vital Signs BP 121/67   Pulse 99   Temp 98.3 F (36.8 C) (Oral)   Resp 18   Ht 5\' 3"  (1.6 m)   Wt 90.7 kg (200 lb)   LMP 04/14/2017   SpO2 96%   BMI 35.43 kg/m   Physical Exam  Constitutional: She is oriented to person, place, and time. She appears well-developed and well-nourished.  Nontoxic-appearing  HENT:  Head: Normocephalic and atraumatic.  Oral  pharyngeal area is erythematous; no pus or peritonsillar abscess.  Tympanic membranes are gray and dull bilaterally  Eyes: Conjunctivae are normal.  Neck: Neck supple.  Cardiovascular: Normal rate and regular rhythm.  Pulmonary/Chest: Effort normal and breath sounds normal.  Abdominal: Soft. Bowel sounds are normal.  Musculoskeletal: Normal range of motion.  Neurological: She is alert and oriented to person, place, and time.  Skin: Skin is warm and dry.  Psychiatric: She has a normal mood and affect. Her behavior is normal.  Nursing note and vitals reviewed.    ED Treatments / Results  Labs (all labs ordered are listed, but only abnormal results are displayed) Labs Reviewed  CBG MONITORING, ED    EKG  EKG Interpretation None       Radiology No results found.  Procedures Procedures (including critical care time)  Medications Ordered in ED Medications  amoxicillin (AMOXIL) capsule 1,000 mg (1,000 mg Oral Given 04/26/17 0759)     Initial Impression / Assessment and Plan / ED Course  I have reviewed the triage vital signs and the nursing notes.  Pertinent labs & imaging results that were available during my care of the patient were reviewed by me and considered in my medical decision making (see chart for details).     Patient presents with ear pain and sore throat.  This is mostly viral in etiology, but will initiate antibiotics secondary to her immunocompromise state.  Final Clinical Impressions(s) / ED Diagnoses   Final diagnoses:  Pharyngitis, unspecified etiology  Bilateral otitis media, unspecified otitis media type    ED Discharge Orders        Ordered    amoxicillin (AMOXIL) 500 MG capsule  3 times daily     04/26/17 0807       Donnetta Hutching, MD 04/26/17 [phone]

## 2017-04-26 NOTE — Discharge Instructions (Signed)
Increase fluids, Tylenol, prescription for antibiotic.  Gargle with salt water.

## 2017-05-28 ENCOUNTER — Other Ambulatory Visit: Payer: Self-pay | Admitting: Family Medicine

## 2017-06-08 ENCOUNTER — Ambulatory Visit (INDEPENDENT_AMBULATORY_CARE_PROVIDER_SITE_OTHER): Payer: No Typology Code available for payment source | Admitting: Family Medicine

## 2017-06-08 ENCOUNTER — Encounter: Payer: Self-pay | Admitting: Family Medicine

## 2017-06-08 VITALS — BP 130/82 | Temp 98.0°F | Ht 62.0 in | Wt 232.8 lb

## 2017-06-08 DIAGNOSIS — J329 Chronic sinusitis, unspecified: Secondary | ICD-10-CM

## 2017-06-08 MED ORDER — CEFDINIR 300 MG PO CAPS: 300.0000 mg | ORAL_CAPSULE | Freq: Two times a day (BID) | ORAL | 0 refills | Status: DC

## 2017-06-08 NOTE — Progress Notes (Signed)
   Subjective:    Patient ID: Kimberly Becker Underwood, female    DOB: 18-Sep-1980, 37 y.o.   MRN: [id number]  Cough  This is Becker new problem. The current episode started in the past 7 days. Associated symptoms include ear pain, Becker fever and nasal congestion. She has tried OTC cough suppressant for the symptoms.   Cough cong and Becker week ago  Bad cough  Sore throat in the morning  Headache pressure, worse with cough ,  Sometimes sharp sometimes aching     Review of Systems  Constitutional: Positive for fever.  HENT: Positive for ear pain.   Respiratory: Positive for cough.        Objective:   Physical Exam  Alert, mild malaise. Hydration good Vitals stable. frontal/ maxillary tenderness evident positive nasal congestion. pharynx normal neck supple  lungs clear/no crackles or wheezes. heart regular in rhythm       Assessment & Plan:  Impression rhinosinusitis likely post viral, discussed with patient. plan antibiotics prescribed. Questions answered. Symptomatic care discussed. warning signs discussed. WSL

## 2017-07-09 ENCOUNTER — Telehealth: Payer: Self-pay | Admitting: Family Medicine

## 2017-07-09 NOTE — Telephone Encounter (Signed)
Patient called to request an appointment for today due to vomiting.  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  I advised her to go to the urgent care as we are not taking anymore appointments today. She understood.

## 2017-07-23 ENCOUNTER — Other Ambulatory Visit: Payer: Self-pay | Admitting: Family Medicine

## 2017-07-23 NOTE — Telephone Encounter (Signed)
Ok six mo worth 

## 2017-08-11 ENCOUNTER — Telehealth: Payer: Self-pay | Admitting: *Deleted

## 2017-08-11 MED ORDER — INSULIN LISPRO 100 UNIT/ML ~~LOC~~ SOLN: SUBCUTANEOUS | 0 refills | Status: DC

## 2017-08-11 NOTE — Telephone Encounter (Signed)
Ok times one 

## 2017-08-11 NOTE — Telephone Encounter (Signed)
[facility] calling to get refill on humalog. Pt is out. Last refill sent in February with a note that she needs office visit. Last diabetic check up was June 2018.

## 2017-08-11 NOTE — Telephone Encounter (Signed)
Prescription sent electronically to pharmacy-with note that patient needs office visit.

## 2017-08-16 ENCOUNTER — Ambulatory Visit: Payer: No Typology Code available for payment source | Admitting: Family Medicine

## 2017-08-16 ENCOUNTER — Telehealth: Payer: Self-pay | Admitting: Nurse Practitioner

## 2017-08-16 ENCOUNTER — Encounter: Payer: Self-pay | Admitting: Nurse Practitioner

## 2017-08-16 ENCOUNTER — Ambulatory Visit (INDEPENDENT_AMBULATORY_CARE_PROVIDER_SITE_OTHER): Payer: No Typology Code available for payment source | Admitting: Nurse Practitioner

## 2017-08-16 ENCOUNTER — Encounter: Payer: Self-pay | Admitting: Family Medicine

## 2017-08-16 ENCOUNTER — Other Ambulatory Visit: Payer: Self-pay | Admitting: Nurse Practitioner

## 2017-08-16 VITALS — BP 138/88 | Temp 99.0°F | Ht 62.0 in | Wt 231.0 lb

## 2017-08-16 DIAGNOSIS — E109 Type 1 diabetes mellitus without complications: Secondary | ICD-10-CM

## 2017-08-16 DIAGNOSIS — A084 Viral intestinal infection, unspecified: Secondary | ICD-10-CM | POA: Diagnosis not present

## 2017-08-16 LAB — POCT GLUCOSE (DEVICE FOR HOME USE): POC Glucose: 141 mg/dl — AB (ref 70–99)

## 2017-08-16 MED ORDER — INSULIN LISPRO 100 UNIT/ML ~~LOC~~ SOLN: SUBCUTANEOUS | 0 refills | Status: DC

## 2017-08-16 MED ORDER — ALPRAZOLAM 0.5 MG PO TABS: 0.5000 mg | ORAL_TABLET | Freq: Every evening | ORAL | 0 refills | Status: DC | PRN

## 2017-08-16 MED ORDER — INSULIN GLARGINE 100 UNIT/ML ~~LOC~~ SOLN: 0.0000 [IU] | Freq: Every day | SUBCUTANEOUS | 1 refills | Status: DC

## 2017-08-16 MED ORDER — ONDANSETRON 8 MG PO TBDP: 8.0000 mg | ORAL_TABLET | Freq: Three times a day (TID) | ORAL | 0 refills | Status: DC | PRN

## 2017-08-16 NOTE — Telephone Encounter (Signed)
Pt called and stated that her Insulin was sent to [facility] but it needs to be sent to [facility] order

## 2017-08-16 NOTE — Telephone Encounter (Signed)
Sent to preferred pharmacy

## 2017-08-17 ENCOUNTER — Encounter: Payer: Self-pay | Admitting: Nurse Practitioner

## 2017-08-17 NOTE — Progress Notes (Signed)
Subjective: Presents with complaints of sudden onset vomiting and diarrhea that began last night.  Has not had any vomiting over the past 4 hours but minimal fluid intake.  Had a few leftover Zofran which has helped.  Watery diarrhea this morning.  Her foster daughter had a stomach virus recently.  No fever.  Headache only with vomiting.  No cough runny nose or wheezing.  Has not voided in several hours.  No dysuria.  Is not using her sliding scale.  Some generalized abdominal pain.  Objective:   BP 138/88   Temp 99 F (37.2 C) (Oral)   Ht  (1.575 m)   Wt 231 lb (104.8 kg)   BMI 42.25 kg/m  NAD.  Alert, oriented.  TMs minimal clear effusion, no erythema.  Pharynx clear and moist.  Neck supple with minimal anterior adenopathy.  Lungs clear.  Heart regular rate and rhythm.  Bowel sounds present x4.  Abdomen soft nondistended with generalized upper and lower abdominal tenderness.  No specific area of tenderness noted. Results for orders placed or performed in visit on 08/16/17  POCT Glucose (Device for Home Use)  Result Value Ref Range   Glucose Fasting, POC  70 - 99 mg/dL   POC Glucose 595 (A) 70 - 99 mg/dl     Assessment:   Problem List Items Addressed This Visit      Endocrine   Type 1 diabetes (HCC)   Relevant Orders   POCT Glucose (Device for Home Use) (Completed)    Other Visit Diagnoses    Viral gastroenteritis    -  Primary       Plan:   Meds ordered this encounter  Medications  . DISCONTD: insulin lispro (HUMALOG) 100 UNIT/ML injection    Sig: INJECT 0-20 UNITS INTO THE SKIN FOUR TIMES A DAY BEFORE MEALS AND AT BEDTIME AS PER SLIDING SCALE    Dispense:  60 vial    Refill:  0    NEEDS OFFICE VISIT!    Order Specific Question:   Supervising Provider    Answer:   Merlyn Albert [[id number]]  . DISCONTD: insulin glargine (LANTUS) 100 UNIT/ML injection    Sig: Inject 0-0.2 mLs (0-20 Units total) into the skin at bedtime. Pt uses as needed per sliding scale.  BS less  than 120:  No insulin    Dispense:  30 mL    Refill:  1    90 day supply    Order Specific Question:   Supervising Provider    Answer:   Merlyn Albert [[id number]]  . ondansetron (ZOFRAN-ODT) 8 MG disintegrating tablet    Sig: Take 1 tablet (8 mg total) by mouth every 8 (eight) hours as needed for nausea or vomiting.    Dispense:  30 tablet    Refill:  0    Order Specific Question:   Supervising Provider    Answer:   Merlyn Albert [[id number]]  . ALPRAZolam (XANAX) 0.5 MG tablet    Sig: Take 1 tablet (0.5 mg total) by mouth at bedtime as needed.    Dispense:  30 tablet    Refill:  0    Order Specific Question:   Supervising Provider    Answer:   Riccardo Dubin   Restart her sliding scale as directed if needed.  Continue Zofran as directed.  Strongly recommend clear liquids such as water and low calorie Gatorade today.  If patient cannot get rehydrated within the next few hours she agrees  to go to local ED for evaluation.  Reviewed signs and symptoms of dehydration.  Recheck here as needed.  Gradually resume regular diet.

## 2017-08-19 ENCOUNTER — Ambulatory Visit: Payer: No Typology Code available for payment source | Admitting: Nurse Practitioner

## 2017-12-29 ENCOUNTER — Telehealth: Payer: Self-pay | Admitting: Family Medicine

## 2017-12-29 MED ORDER — INSULIN LISPRO 100 UNIT/ML ~~LOC~~ SOLN: SUBCUTANEOUS | 0 refills | Status: DC

## 2017-12-29 NOTE — Telephone Encounter (Signed)
Patient last Diabetic check up and HgbA1c was 09/2016- patient scheduled office visit 01/04/18

## 2017-12-29 NOTE — Telephone Encounter (Signed)
Prescription sent electronically to pharmacy. Patient notified. 

## 2017-12-29 NOTE — Telephone Encounter (Signed)
Patient needs new prescription for humalog 100 unit/ml injection faxed to [phone]

## 2017-12-29 NOTE — Telephone Encounter (Signed)
Ok times one 

## 2018-01-04 ENCOUNTER — Ambulatory Visit: Payer: No Typology Code available for payment source | Admitting: Family Medicine

## 2018-01-04 ENCOUNTER — Encounter: Payer: Self-pay | Admitting: Family Medicine

## 2018-01-04 VITALS — BP 116/70 | Ht 62.0 in | Wt 222.0 lb

## 2018-01-04 DIAGNOSIS — Z23 Encounter for immunization: Secondary | ICD-10-CM

## 2018-01-04 DIAGNOSIS — E119 Type 2 diabetes mellitus without complications: Secondary | ICD-10-CM

## 2018-01-04 LAB — POCT GLYCOSYLATED HEMOGLOBIN (HGB A1C): HEMOGLOBIN A1C: 6.2 % — AB (ref 4.0–5.6)

## 2018-01-04 MED ORDER — PHENTERMINE HCL 37.5 MG PO CAPS: 37.5000 mg | ORAL_CAPSULE | ORAL | 2 refills | Status: DC

## 2018-01-04 MED ORDER — INSULIN GLARGINE 100 UNIT/ML ~~LOC~~ SOLN: 0.0000 [IU] | Freq: Every day | SUBCUTANEOUS | 1 refills | Status: DC

## 2018-01-04 MED ORDER — INSULIN LISPRO 100 UNIT/ML ~~LOC~~ SOLN: SUBCUTANEOUS | 1 refills | Status: DC

## 2018-01-04 NOTE — Progress Notes (Signed)
   Subjective:    Patient ID: Kimberly Becker, female    DOB: 1981/02/20, 37 y.o.   MRN: [id number]  Diabetes  She presents for her follow-up diabetic visit. She has type 2 diabetes mellitus. She is compliant with treatment all of the time. Home blood sugar record trend: has been running good. today 111. She does not see a podiatrist.Eye exam is not current (will schedule after getting vision insurance).   Would like to start back on phentermine.   Results for orders placed or performed in visit on 01/04/18  POCT glycosylated hemoglobin (Hb A1C)  Result Value Ref Range   Hemoglobin A1C 6.2 (A) 4.0 - 5.6 %   HbA1c POC (<> result, manual entry)     HbA1c, POC (prediabetic range)     HbA1c, POC (controlled diabetic range)     Flu vaccine today.   Patient claims compliance with diabetes medication. No obvious side effects. Reports no substantial low sugar spells. Most numbers are generally in good range when checked fasting. Generally does not miss a dose of medication. Watching diabetic diet closely  chalenge losing the weight  Has had phenrtermine in the past  Has gained back    motivted to llose weight     Review of Systems No headache, no major weight loss or weight gain, no chest pain no back pain abdominal pain no change in bowel habits complete ROS otherwise negative     Objective:   Physical Exam  Alert and oriented, vitals reviewed and stable, NAD ENT-TM's and ext canals WNL bilat via otoscopic exam Soft palate, tonsils and post pharynx WNL via oropharyngeal exam Neck-symmetric, no masses; thyroid nonpalpable and nontender Pulmonary-no tachypnea or accessory muscle use; Clear without wheezes via auscultation Card--no abnrml murmurs, rhythm reg and rate WNL Carotid pulses symmetric, without bruits       Assessment & Plan:  Impression type 1 diabetes.  No substantial hypoglycemic episodes since last seen.  We have tried to encourage her to see an  endocrinologist, but she does not want to, has had bad experiences past overall handling her insulin well.  Monitors her sugars closely.  Wife a lot less chaotic now since divorced.  Has not seen an eye doctor recently plans to do so soon  2.  Morbid obesity discussed certainly should need to call health.  Patient did well in the past with phentermine discussed will fill 3 months worth.  Patient declines blood work due to finances same her insurance is poor in this regard  Follow-up in 6 months

## 2018-02-17 ENCOUNTER — Other Ambulatory Visit: Payer: Self-pay | Admitting: Family Medicine

## 2018-02-17 NOTE — Telephone Encounter (Signed)
Ok six mo worth 

## 2018-04-14 LAB — HM DIABETES EYE EXAM

## 2018-04-19 ENCOUNTER — Ambulatory Visit (INDEPENDENT_AMBULATORY_CARE_PROVIDER_SITE_OTHER): Payer: No Typology Code available for payment source | Admitting: *Deleted

## 2018-04-19 DIAGNOSIS — Z111 Encounter for screening for respiratory tuberculosis: Secondary | ICD-10-CM

## 2018-04-21 LAB — TB SKIN TEST
Induration: 0 mm
TB Skin Test: NEGATIVE

## 2018-05-04 ENCOUNTER — Emergency Department (HOSPITAL_COMMUNITY): Payer: No Typology Code available for payment source

## 2018-05-04 ENCOUNTER — Other Ambulatory Visit: Payer: Self-pay

## 2018-05-04 ENCOUNTER — Emergency Department (HOSPITAL_COMMUNITY)
Admission: EM | Admit: 2018-05-04 | Discharge: 2018-05-04 | Disposition: A | Discharge status: Home / Self Care | Payer: No Typology Code available for payment source | Attending: Emergency Medicine | Admitting: Emergency Medicine

## 2018-05-04 ENCOUNTER — Telehealth: Payer: Self-pay | Admitting: *Deleted

## 2018-05-04 ENCOUNTER — Encounter (HOSPITAL_COMMUNITY): Payer: Self-pay | Admitting: *Deleted

## 2018-05-04 DIAGNOSIS — R109 Unspecified abdominal pain: Secondary | ICD-10-CM | POA: Diagnosis present

## 2018-05-04 DIAGNOSIS — Z794 Long term (current) use of insulin: Secondary | ICD-10-CM | POA: Diagnosis not present

## 2018-05-04 DIAGNOSIS — R112 Nausea with vomiting, unspecified: Secondary | ICD-10-CM

## 2018-05-04 DIAGNOSIS — R102 Pelvic and perineal pain unspecified side: Secondary | ICD-10-CM

## 2018-05-04 DIAGNOSIS — R197 Diarrhea, unspecified: Secondary | ICD-10-CM | POA: Insufficient documentation

## 2018-05-04 DIAGNOSIS — E109 Type 1 diabetes mellitus without complications: Secondary | ICD-10-CM | POA: Insufficient documentation

## 2018-05-04 DIAGNOSIS — Z79899 Other long term (current) drug therapy: Secondary | ICD-10-CM | POA: Diagnosis not present

## 2018-05-04 DIAGNOSIS — E1040 Type 1 diabetes mellitus with diabetic neuropathy, unspecified: Secondary | ICD-10-CM | POA: Insufficient documentation

## 2018-05-04 LAB — COMPREHENSIVE METABOLIC PANEL
ALBUMIN: 3.8 g/dL (ref 3.5–5.0)
ALT: 20 U/L (ref 0–44)
AST: 20 U/L (ref 15–41)
Alkaline Phosphatase: 94 U/L (ref 38–126)
Anion gap: 15 (ref 5–15)
BUN: 18 mg/dL (ref 6–20)
CHLORIDE: 94 mmol/L — AB (ref 98–111)
CO2: 21 mmol/L — AB (ref 22–32)
Calcium: 8.6 mg/dL — ABNORMAL LOW (ref 8.9–10.3)
Creatinine, Ser: 0.83 mg/dL (ref 0.44–1.00)
GFR calc Af Amer: >60 mL/min (ref >60)
GFR calc non Af Amer: >60 mL/min (ref >60)
GLUCOSE: 489 mg/dL — AB (ref 70–99)
POTASSIUM: 3.9 mmol/L (ref 3.5–5.1)
SODIUM: 130 mmol/L — AB (ref 135–145)
Total Bilirubin: 1.6 mg/dL — ABNORMAL HIGH (ref 0.3–1.2)
Total Protein: 7.5 g/dL (ref 6.5–8.1)

## 2018-05-04 LAB — CBC WITH DIFFERENTIAL/PLATELET
Abs Immature Granulocytes: 0.04 10*3/uL (ref 0.00–0.07)
Basophils Absolute: 0.1 10*3/uL (ref 0.0–0.1)
Basophils Relative: 0 %
Eosinophils Absolute: 0 10*3/uL (ref 0.0–0.5)
Eosinophils Relative: 0 %
HCT: 41.3 % (ref 36.0–46.0)
Hemoglobin: 13.2 g/dL (ref 12.0–15.0)
Immature Granulocytes: 0 %
LYMPHS ABS: 0.7 10*3/uL (ref 0.7–4.0)
Lymphocytes Relative: 5 %
MCH: 29.7 pg (ref 26.0–34.0)
MCHC: 32 g/dL (ref 30.0–36.0)
MCV: 93 fL (ref 80.0–100.0)
Monocytes Absolute: 0.8 10*3/uL (ref 0.1–1.0)
Monocytes Relative: 6 %
Neutro Abs: 12.5 10*3/uL — ABNORMAL HIGH (ref 1.7–7.7)
Neutrophils Relative %: 89 %
Platelets: 286 10*3/uL (ref 150–400)
RBC: 4.44 MIL/uL (ref 3.87–5.11)
RDW: 13.6 % (ref 11.5–15.5)
WBC: 14.2 10*3/uL — ABNORMAL HIGH (ref 4.0–10.5)
nRBC: 0 % (ref 0.0–0.2)

## 2018-05-04 LAB — CBG MONITORING, ED
GLUCOSE-CAPILLARY: 436 mg/dL — AB (ref 70–99)
Glucose-Capillary: 276 mg/dL — ABNORMAL HIGH (ref 70–99)

## 2018-05-04 LAB — URINALYSIS, ROUTINE W REFLEX MICROSCOPIC
BACTERIA UA: NONE SEEN
Bilirubin Urine: NEGATIVE
Hgb urine dipstick: NEGATIVE
KETONES UR: 80 mg/dL — AB
Leukocytes, UA: NEGATIVE
Nitrite: NEGATIVE
PROTEIN: NEGATIVE mg/dL
Specific Gravity, Urine: 1.024 (ref 1.005–1.030)
pH: 5 (ref 5.0–8.0)

## 2018-05-04 LAB — LIPASE, BLOOD: LIPASE: 20 U/L (ref 11–51)

## 2018-05-04 LAB — PREGNANCY, URINE: Preg Test, Ur: NEGATIVE

## 2018-05-04 MED ORDER — ONDANSETRON 4 MG PO TBDP: 4.0000 mg | ORAL_TABLET | Freq: Three times a day (TID) | ORAL | 0 refills | Status: DC | PRN

## 2018-05-04 MED ORDER — MORPHINE SULFATE (PF) 4 MG/ML IV SOLN
4.0000 mg | INTRAVENOUS | Status: DC | PRN
  Administered 2018-05-04: 4 mg via INTRAVENOUS
  Filled 2018-05-04: qty 1

## 2018-05-04 MED ORDER — INSULIN ASPART 100 UNIT/ML ~~LOC~~ SOLN
12.0000 [IU] | Freq: Once | SUBCUTANEOUS | Status: AC
  Administered 2018-05-04: 12 [IU] via SUBCUTANEOUS
  Filled 2018-05-04: qty 1

## 2018-05-04 MED ORDER — SODIUM CHLORIDE 0.9% FLUSH: 3.0000 mL | Freq: Once | INTRAVENOUS | Status: DC

## 2018-05-04 MED ORDER — ONDANSETRON HCL 4 MG/2ML IJ SOLN
4.0000 mg | INTRAMUSCULAR | Status: DC | PRN
  Administered 2018-05-04: 4 mg via INTRAVENOUS
  Filled 2018-05-04: qty 2

## 2018-05-04 MED ORDER — IOPAMIDOL (ISOVUE-300) INJECTION 61%
100.0000 mL | Freq: Once | INTRAVENOUS | Status: AC | PRN
  Administered 2018-05-04: 100 mL via INTRAVENOUS

## 2018-05-04 MED ORDER — SODIUM CHLORIDE 0.9 % IV BOLUS
2000.0000 mL | Freq: Once | INTRAVENOUS | Status: AC
  Administered 2018-05-04: 1000 mL via INTRAVENOUS

## 2018-05-04 NOTE — ED Notes (Signed)
Family at bedside. 

## 2018-05-04 NOTE — Discharge Instructions (Signed)
Take the prescription as directed.  Increase your fluid intake (ie:  Gatoraide) for the next few days.  Eat a bland diet and advance to your regular diet slowly as you can tolerate it.   Avoid full strength juices, as well as milk and milk products until your diarrhea has resolved.   Call your regular medical doctor tomorrow to schedule a follow up appointment in the next 2 days.  Return to the Emergency Department immediately sooner if worsening.  °

## 2018-05-04 NOTE — Telephone Encounter (Signed)
Pt called started having diarrhea and abd pain about 2 am this morning. Sharp pain in lower mid to left side. No fever. Offered appt this afternoon but then pt states she is just going to go to ED because she just threw up and she cannot stand up straight. Advised pt that yes she should go to ED

## 2018-05-04 NOTE — ED Notes (Signed)
Pt states that she does not want a pelvic exam.

## 2018-05-04 NOTE — ED Provider Notes (Signed)
[facility] EMERGENCY DEPARTMENT Provider Note   CSN: [id number] Arrival date & time: 05/04/18  1026     History   Chief Complaint Chief Complaint  Patient presents with  . Abdominal Pain    HPI Kimberly Becker is a 38 y.o. female.   Abdominal Pain   Pt was seen at 1415.  Per pt, c/o gradual onset and persistence of constant lower abd "pain" since 0200 overnight last night.  Has been associated with multiple intermittent episodes of N/V/D.  Describes the abd pain as "sharp."  Describes the diarrhea as "watery." Pt states she has not taken her insulin since this morning due to coming to the ED. Denies fevers, no back pain, no rash, no CP/SOB, no black or blood in stools or emesis.      Past Medical History:  Diagnosis Date  . Anxiety   . History of chickenpox   . Migraine headache   . PCOS (polycystic ovarian syndrome)   . Type I (juvenile type) diabetes mellitus without mention of complication, not stated as uncontrolled     Patient Active Problem List   Diagnosis Date Noted  . Diabetic neuropathy, painful (HCC) 02/14/2015  . Type I (juvenile type) diabetes mellitus without mention of complication, not stated as uncontrolled 09/26/2014  . Anxiety 06/26/2013  . Hyperlipidemia 03/20/2013  . Morbid obesity (HCC) 11/08/2012  . Migraines 08/10/2012  . Type 1 diabetes (HCC) 11/12/2010  . PCO (polycystic ovaries) 11/12/2010    Past Surgical History:  Procedure Laterality Date  . APPENDECTOMY  1992  . BREAST BIOPSY  2012   benign     OB History    Gravida  1   Para      Term      Preterm      AB      Living        SAB      TAB      Ectopic      Multiple      Live Births               Home Medications    Prior to Admission medications   Medication Sig Start Date End Date Taking? Authorizing Provider  ALPRAZolam Prudy Feeler(XANAX) 0.5 MG tablet TAKE 1 TABLET BY MOUTH AT BEDTIME AS NEEDED 02/17/18   Merlyn AlbertLuking, William S, MD  insulin glargine (LANTUS)  100 UNIT/ML injection Inject 0-0.2 mLs (0-20 Units total) into the skin at bedtime. Pt uses as needed per sliding scale.  BS less than 120:  No insulin 01/04/18   Merlyn AlbertLuking, William S, MD  insulin lispro (HUMALOG) 100 UNIT/ML injection INJECT 0-20 UNITS INTO THE SKIN FOUR TIMES A DAY BEFORE MEALS AND AT BEDTIME AS PER SLIDING SCALE 01/04/18   Merlyn AlbertLuking, William S, MD  Multiple Vitamin (MULTIVITAMIN WITH MINERALS) TABS tablet Take 1 tablet by mouth daily.    [provider]  omega-3 acid ethyl esters (LOVAZA) 1 g capsule Take 1 g by mouth daily.    [provider]  ondansetron (ZOFRAN-ODT) 8 MG disintegrating tablet Take 1 tablet (8 mg total) by mouth every 8 (eight) hours as needed for nausea or vomiting. 08/16/17   Campbell RichesHoskins, Carolyn C, NP  phentermine 37.5 MG capsule Take 1 capsule (37.5 mg total) by mouth every morning. 01/04/18   Merlyn AlbertLuking, William S, MD    Family History Family History  Problem Relation Age of Onset  . Cancer Mother        Ovarian Cancer  .  Heart disease Mother   . Heart disease Maternal Grandfather   . Diabetes Maternal Grandfather   . Heart disease Maternal Grandmother 19       first MI at age 55; second at 23  . Cancer Paternal Grandmother 62       breast cancer  . Cancer Cousin 39       before age 47; double mastectomy; breast CA  . Diabetes Paternal Grandfather   . Cancer Other        Breast Cancer-Aunt    Social History Social History   Tobacco Use  . Smoking status: Never Smoker  . Smokeless tobacco: Never Used  Substance Use Topics  . Alcohol use: No  . Drug use: No     Allergies   Patient has no known allergies.   Review of Systems Review of Systems  Gastrointestinal: Positive for abdominal pain.  ROS: Statement: All systems negative except as marked or noted in the HPI; Constitutional: Negative for fever and chills. ; ; Eyes: Negative for eye pain, redness and discharge. ; ; ENMT: Negative for ear pain, hoarseness, nasal congestion,  sinus pressure and sore throat. ; ; Cardiovascular: Negative for chest pain, palpitations, diaphoresis, dyspnea and peripheral edema. ; ; Respiratory: Negative for cough, wheezing and stridor. ; ; Gastrointestinal: +N/V/D, abd pain. Negative for blood in stool, hematemesis, jaundice and rectal bleeding. . ; ; Genitourinary: Negative for dysuria, flank pain and hematuria. ; ; GYN:  No pelvic pain, no vaginal bleeding, no vaginal discharge, no vulvar pain.;; Musculoskeletal: Negative for back pain and neck pain. Negative for swelling and trauma.; ; Skin: Negative for pruritus, rash, abrasions, blisters, bruising and skin lesion.; ; Neuro: Negative for headache, lightheadedness and neck stiffness. Negative for weakness, altered level of consciousness, altered mental status, extremity weakness, paresthesias, involuntary movement, seizure and syncope.        Physical Exam Updated Vital Signs BP 130/78 (BP Location: Right Arm)   Pulse (!) 105   Temp 98.5 F (36.9 C) (Oral)   Resp 16   Ht 5\' 3"  (1.6 m)   Wt 99.8 kg   LMP 04/05/2018 (Approximate)   SpO2 99%   BMI 38.97 kg/m   BP 112/65   Pulse 98   Temp 98.5 F (36.9 C) (Oral)   Resp 16   Ht 5\' 3"  (1.6 m)   Wt 99.8 kg   LMP 04/05/2018 (Approximate)   SpO2 98%   BMI 38.97 kg/m    Physical Exam 1420: Physical examination:  Nursing notes reviewed; Vital signs and O2 SAT reviewed;  Constitutional: Well developed, Well nourished, Well hydrated, Uncomfortable appearing.; Head:  Normocephalic, atraumatic; Eyes: EOMI, PERRL, No scleral icterus; ENMT: Mouth and pharynx normal, Mucous membranes moist; Neck: Supple, Full range of motion, No lymphadenopathy; Cardiovascular: Regular rate and rhythm, No gallop; Respiratory: Breath sounds clear & equal bilaterally, No wheezes.  Speaking full sentences with ease, Normal respiratory effort/excursion; Chest: Nontender, Movement normal; Abdomen: Soft, +suprapubic, LLQ, RUQ tenderness to palp. No rebound or  guarding. Nondistended, Normal bowel sounds; Genitourinary: No CVA tenderness; Extremities: Peripheral pulses normal, No tenderness, No edema, No calf edema or asymmetry.; Neuro: AA&Ox3, Major CN grossly intact.  Speech clear. No gross focal motor or sensory deficits in extremities.; Skin: Color normal, Warm, Dry.   ED Treatments / Results  Labs (all labs ordered are listed, but only abnormal results are displayed)   EKG None  Radiology   Procedures Procedures (including critical care time)  Medications Ordered in ED  Medications  sodium chloride flush (NS) 0.9 % injection 3 mL (has no administration in time range)  sodium chloride 0.9 % bolus 2,000 mL (has no administration in time range)  morphine 4 MG/ML injection 4 mg (has no administration in time range)  ondansetron (ZOFRAN) injection 4 mg (has no administration in time range)     Initial Impression / Assessment and Plan / ED Course  I have reviewed the triage vital signs and the nursing notes.  Pertinent labs & imaging results that were available during my care of the patient were reviewed by me and considered in my medical decision making (see chart for details).  MDM Reviewed: previous chart, nursing note and vitals Reviewed previous: labs Interpretation: labs, x-ray and CT scan   Results for orders placed or performed during the hospital encounter of 05/04/18  Lipase, blood  Result Value Ref Range   Lipase 20 11 - 51 U/L  Comprehensive metabolic panel  Result Value Ref Range   Sodium 130 (L) 135 - 145 mmol/L   Potassium 3.9 3.5 - 5.1 mmol/L   Chloride 94 (L) 98 - 111 mmol/L   CO2 21 (L) 22 - 32 mmol/L   Glucose, Bld 489 (H) 70 - 99 mg/dL   BUN 18 6 - 20 mg/dL   Creatinine, Ser 1.91 0.44 - 1.00 mg/dL   Calcium 8.6 (L) 8.9 - 10.3 mg/dL   Total Protein 7.5 6.5 - 8.1 g/dL   Albumin 3.8 3.5 - 5.0 g/dL   AST 20 15 - 41 U/L   ALT 20 0 - 44 U/L   Alkaline Phosphatase 94 38 - 126 U/L   Total Bilirubin 1.6 (H)  0.3 - 1.2 mg/dL   GFR calc non Af Amer >60 >60 mL/min   GFR calc Af Amer >60 >60 mL/min   Anion gap 15 5 - 15  Urinalysis, Routine w reflex microscopic  Result Value Ref Range   Color, Urine STRAW (A) YELLOW   APPearance CLEAR CLEAR   Specific Gravity, Urine 1.024 1.005 - 1.030   pH 5.0 5.0 - 8.0   Glucose, UA >=500 (A) NEGATIVE mg/dL   Hgb urine dipstick NEGATIVE NEGATIVE   Bilirubin Urine NEGATIVE NEGATIVE   Ketones, ur 80 (A) NEGATIVE mg/dL   Protein, ur NEGATIVE NEGATIVE mg/dL   Nitrite NEGATIVE NEGATIVE   Leukocytes, UA NEGATIVE NEGATIVE   RBC / HPF 0-5 0 - 5 RBC/hpf   WBC, UA 0-5 0 - 5 WBC/hpf   Bacteria, UA NONE SEEN NONE SEEN   Squamous Epithelial / LPF 0-5 0 - 5   Mucus PRESENT   CBC with Differential/Platelet  Result Value Ref Range   WBC 14.2 (H) 4.0 - 10.5 K/uL   RBC 4.44 3.87 - 5.11 MIL/uL   Hemoglobin 13.2 12.0 - 15.0 g/dL   HCT 47.8 29.5 - 62.1 %   MCV 93.0 80.0 - 100.0 fL   MCH 29.7 26.0 - 34.0 pg   MCHC 32.0 30.0 - 36.0 g/dL   RDW 30.8 65.7 - 84.6 %   Platelets 286 150 - 400 K/uL   nRBC 0.0 0.0 - 0.2 %   Neutrophils Relative % 89 %   Neutro Abs 12.5 (H) 1.7 - 7.7 K/uL   Lymphocytes Relative 5 %   Lymphs Abs 0.7 0.7 - 4.0 K/uL   Monocytes Relative 6 %   Monocytes Absolute 0.8 0.1 - 1.0 K/uL   Eosinophils Relative 0 %   Eosinophils Absolute 0.0 0.0 - 0.5 K/uL  Basophils Relative 0 %   Basophils Absolute 0.1 0.0 - 0.1 K/uL   Immature Granulocytes 0 %   Abs Immature Granulocytes 0.04 0.00 - 0.07 K/uL  Pregnancy, urine  Result Value Ref Range   Preg Test, Ur NEGATIVE NEGATIVE  CBG monitoring, ED  Result Value Ref Range   Glucose-Capillary 436 (H) 70 - 99 mg/dL  POC CBG, ED  Result Value Ref Range   Glucose-Capillary 276 (H) 70 - 99 mg/dL   Dg Chest 2 View Result Date: 05/04/2018 CLINICAL DATA:  Abdominal pain. EXAM: CHEST - 2 VIEW COMPARISON:  May 28, 2016 FINDINGS: The heart size and mediastinal contours are within normal limits. Both lungs  are clear. The visualized skeletal structures are unremarkable. IMPRESSION: No active cardiopulmonary disease. Electronically Signed   By: Gerome Samavid  Williams III M.D   On: 05/04/2018 15:18   Ct Abdomen Pelvis W Contrast Result Date: 05/04/2018 CLINICAL DATA:  Abdominal pain and diarrhea. Nausea and vomiting. EXAM: CT ABDOMEN AND PELVIS WITH CONTRAST TECHNIQUE: Multidetector CT imaging of the abdomen and pelvis was performed using the standard protocol following bolus administration of intravenous contrast. CONTRAST:  100mL ISOVUE-300 IOPAMIDOL (ISOVUE-300) INJECTION 61% COMPARISON:  CT abdomen pelvis 11/04/2016 FINDINGS: LOWER CHEST: There is no basilar pleural or apical pericardial effusion. HEPATOBILIARY: The hepatic contours and density are normal. There is no intra- or extrahepatic biliary dilatation. The gallbladder is normal. PANCREAS: The pancreatic parenchymal contours are normal and there is no ductal dilatation. There is no peripancreatic fluid collection. SPLEEN: Normal. ADRENALS/URINARY TRACT: --Adrenal glands: Normal. --Right kidney/ureter: No hydronephrosis, nephroureterolithiasis, perinephric stranding or solid renal mass. --Left kidney/ureter: No hydronephrosis, nephroureterolithiasis, perinephric stranding or solid renal mass. --Urinary bladder: Normal for degree of distention STOMACH/BOWEL: --Stomach/Duodenum: There is no hiatal hernia or other gastric abnormality. The duodenal course and caliber are normal. --Small bowel: No dilatation or inflammation. --Colon: No focal abnormality. --Appendix: Surgically absent. VASCULAR/LYMPHATIC: Normal course and caliber of the major abdominal vessels. No abdominal or pelvic lymphadenopathy. REPRODUCTIVE: Normal uterus and ovaries. MUSCULOSKELETAL. No bony spinal canal stenosis or focal osseous abnormality. OTHER: None. IMPRESSION: No acute abdominal or pelvic abnormality. Electronically Signed   By: Deatra RobinsonKevin  Herman M.D.   On: 05/04/2018 15:36   [place] Pelvic  Doppler (torsion R/o Or Mass Arterial Flow) Result Date: 05/04/2018 CLINICAL DATA:  Acute onset of middle and left pelvic pain. Clinical suspicion for ovarian torsion. EXAM: TRANSABDOMINAL AND TRANSVAGINAL ULTRASOUND OF PELVIS DOPPLER ULTRASOUND OF OVARIES TECHNIQUE: Both transabdominal and transvaginal ultrasound examinations of the pelvis were performed. Transabdominal technique was performed for global imaging of the pelvis including uterus, ovaries, adnexal regions, and pelvic cul-de-sac. It was necessary to proceed with endovaginal exam following the transabdominal exam to visualize the endometrium and ovaries. Color and duplex Doppler ultrasound was utilized to evaluate blood flow to the ovaries. COMPARISON:  CT on 05/04/2018 FINDINGS: Uterus Measurements: 7.5 x 3.8 x 4.7 cm = volume: 70 mL. No fibroids or other mass visualized. Endometrium Thickness: 4 mm.  No focal abnormality visualized. Right ovary Measurements: 3.8 x 3.3 x 3.3 cm = volume: 21.8 mL. Normal appearance/no adnexal mass. Left ovary Measurements: Not visualized, however no adnexal mass identified. Pulsed Doppler evaluation of the right ovary demonstrates normal low-resistance arterial and venous waveforms. Pulsed Doppler evaluation of the left ovary could not be performed as it was not directly visualized. Other findings No abnormal free fluid. IMPRESSION: Normal appearance of uterus and right ovary. No sonographic evidence for right ovarian torsion. Nonvisualization of left ovary, however  no mass identified. Left ovary could not be evaluated with Doppler. Electronically Signed   By: Myles Rosenthal M.D.   On: 05/04/2018 17:35      1845:  Pt has tol PO well while in the ED without N/V.  No stooling while in the ED.  Abd benign, resps easy. Feels better and wants to go home now. Pt refuses pelvic exam. Pt's CBG trending downward after IVF and SQ insulin. Pt not acidotic. Tx symptomatically at this time.  Dx and testing d/w pt.  Questions  answered.  Verb understanding, agreeable to d/c home with outpt f/u.      Final Clinical Impressions(s) / ED Diagnoses   Final diagnoses:  None    ED Discharge Orders    None       Samuel Jester, DO 05/09/18 1218

## 2018-05-04 NOTE — Progress Notes (Signed)
Inpatient Diabetes Program Recommendations  AACE/ADA: New Consensus Statement on Inpatient Glycemic Control (2015)  Target Ranges:  Prepandial:   less than 140 mg/dL      Peak postprandial:   less than 180 mg/dL (1-2 hours)      Critically ill patients:  140 - 180 mg/dL   Lab Results  Component Value Date   GLUCAP 436 (H) 05/04/2018   HGBA1C 6.2 (A) 01/04/2018    Review of Glycemic Control  Diabetes history: DM1 Outpatient Diabetes medications: Lantus 5-10 units q hs (Patient takes 5 units unless CBG > 250, then takes 10 units) + Humalog correction scale ac meals (patient states correction scale starts @ 175-5-6 units Current orders for Inpatient glycemic control: None  Inpatient Diabetes Program Recommendations:   Please note patient is type 1 and requires insulin. CBG 436. Please consider IV insulin drip. Noted CMP pending. Spoke with RN and sent secure chat to Dr. Clarene Duke.  Thank you, Billy Fischer. Peyson Delao, RN, MSN, CDE  Diabetes Coordinator Inpatient Glycemic Control Team Team Pager [phone] (8am-5pm) 05/04/2018 2:54 PM

## 2018-05-04 NOTE — ED Notes (Addendum)
Pt requesting blood sugar check. 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 Pt left her insulin at her work. Pt reports her blood sugar was 210 this morning at breakfast. Pt's CBG 436.

## 2018-05-04 NOTE — ED Notes (Signed)
Pt refuses pelvic exam, EDP made aware

## 2018-05-04 NOTE — Telephone Encounter (Signed)
ok 

## 2018-05-04 NOTE — ED Triage Notes (Signed)
Abdominal pain with nausea, vomiting and diarrhea.  ?

## 2018-05-06 ENCOUNTER — Other Ambulatory Visit: Payer: Self-pay | Admitting: *Deleted

## 2018-05-06 ENCOUNTER — Telehealth: Payer: Self-pay | Admitting: Family Medicine

## 2018-05-06 MED ORDER — INSULIN LISPRO 100 UNIT/ML ~~LOC~~ SOLN: SUBCUTANEOUS | 0 refills | Status: DC

## 2018-05-06 NOTE — Telephone Encounter (Signed)
Refill sent per protocol. Due for office visit in march.

## 2018-05-06 NOTE — Telephone Encounter (Signed)
Pt has med check set for 05/16/18

## 2018-05-06 NOTE — Telephone Encounter (Signed)
Pt.notified

## 2018-05-06 NOTE — Telephone Encounter (Signed)
Pt is needing refill on insulin lispro (HUMALOG) 100 UNIT/ML injection. She has maybe enough for today and tomorrow.   Please send to [id number] - [place], [place] - [address]  Pt states the pharmacy requested the refill yesterday and the day before.

## 2018-05-11 ENCOUNTER — Other Ambulatory Visit: Payer: Self-pay

## 2018-05-16 ENCOUNTER — Ambulatory Visit: Payer: No Typology Code available for payment source | Admitting: Family Medicine

## 2018-06-13 ENCOUNTER — Encounter: Payer: Self-pay | Admitting: Family Medicine

## 2018-06-13 ENCOUNTER — Ambulatory Visit: Payer: No Typology Code available for payment source | Admitting: Family Medicine

## 2018-06-13 VITALS — BP 124/82 | Ht 62.0 in | Wt 230.0 lb

## 2018-06-13 DIAGNOSIS — Z13220 Encounter for screening for lipoid disorders: Secondary | ICD-10-CM | POA: Diagnosis not present

## 2018-06-13 DIAGNOSIS — E109 Type 1 diabetes mellitus without complications: Secondary | ICD-10-CM

## 2018-06-13 DIAGNOSIS — E119 Type 2 diabetes mellitus without complications: Secondary | ICD-10-CM | POA: Diagnosis not present

## 2018-06-13 DIAGNOSIS — Z79899 Other long term (current) drug therapy: Secondary | ICD-10-CM | POA: Diagnosis not present

## 2018-06-13 LAB — POCT GLYCOSYLATED HEMOGLOBIN (HGB A1C): HEMOGLOBIN A1C: 5.4 % (ref 4.0–5.6)

## 2018-06-13 MED ORDER — INSULIN LISPRO 100 UNIT/ML ~~LOC~~ SOLN: SUBCUTANEOUS | 0 refills | Status: DC

## 2018-06-13 MED ORDER — INSULIN GLARGINE 100 UNIT/ML ~~LOC~~ SOLN: 0.0000 [IU] | Freq: Every day | SUBCUTANEOUS | 1 refills | Status: DC

## 2018-06-13 NOTE — Progress Notes (Signed)
   Subjective:    Patient ID: Kimberly Becker, female    DOB: 02-08-1981, 38 y.o.   MRN: [id number]  Diabetes  She presents for her follow-up diabetic visit. She has type 2 diabetes mellitus. Risk factors for coronary artery disease include diabetes mellitus. She is compliant with treatment all of the time. Her weight is stable. She is following a diabetic diet.    Results for orders placed or performed in visit on 06/13/18  POCT glycosylated hemoglobin (Hb A1C)  Result Value Ref Range   Hemoglobin A1C 5.4 4.0 - 5.6 %   HbA1c POC (<> result, manual entry)     HbA1c, POC (prediabetic range)     HbA1c, POC (controlled diabetic range)     Patient claims compliance with diabetes medication. No obvious side effects. Reports no substantial low sugar spells. Most numbers are generally in good range when checked fasting. Generally does not miss a dose of medication. Watching diabetic diet closely  No low sugar spells overall numbers good  Exercise not much these days  Working long hours  Diet verall prety good, fifty fifty   Trying to do better                       Review of Systems No headache, no major weight loss or weight gain, no chest pain no back pain abdominal pain no change in bowel habits complete ROS otherwise negative     Objective:   Physical Exam  Alert vitals stable, NAD. Blood pressure good on repeat. HEENT normal. Lungs clear. Heart regular rate and rhythm.       Assessment & Plan:  Impression type 1 diabetes.  Saw optometrist 2 months ago.  No retinopathy.  No low sugar spells at all despite low risk A1c.  Discussed with patient.  Back off on Lantus 10 today.  Diet discussed exercise discussed.  Appropriate blood work follow-up in 6 months

## 2018-06-14 ENCOUNTER — Telehealth: Payer: Self-pay | Admitting: Family Medicine

## 2018-06-14 LAB — MICROALBUMIN / CREATININE URINE RATIO
CREATININE, UR: 57.8 mg/dL
Microalb/Creat Ratio: <5 mg/g creat (ref 0–29)
Microalbumin, Urine: <3 ug/mL

## 2018-06-14 LAB — BASIC METABOLIC PANEL
BUN / CREAT RATIO: 25 — AB (ref 9–23)
BUN: 19 mg/dL (ref 6–20)
CO2: 23 mmol/L (ref 20–29)
Calcium: 9.6 mg/dL (ref 8.7–10.2)
Chloride: 102 mmol/L (ref 96–106)
Creatinine, Ser: 0.75 mg/dL (ref 0.57–1.00)
GFR calc Af Amer: 116 mL/min/{1.73_m2} (ref >59)
GFR calc non Af Amer: 101 mL/min/{1.73_m2} (ref >59)
Glucose: 26 mg/dL — CL (ref 65–99)
Potassium: 4 mmol/L (ref 3.5–5.2)
Sodium: 140 mmol/L (ref 134–144)

## 2018-06-14 LAB — LIPID PANEL
Chol/HDL Ratio: 2.6 ratio (ref 0.0–4.4)
Cholesterol, Total: 185 mg/dL (ref 100–199)
HDL: 71 mg/dL (ref >39)
LDL Calculated: 104 mg/dL — ABNORMAL HIGH (ref 0–99)
Triglycerides: 50 mg/dL (ref 0–149)
VLDL Cholesterol Cal: 10 mg/dL (ref 5–40)

## 2018-06-14 LAB — HEPATIC FUNCTION PANEL
ALT: 26 IU/L (ref 0–32)
AST: 31 IU/L (ref 0–40)
Albumin: 4.5 g/dL (ref 3.8–4.8)
Alkaline Phosphatase: 89 IU/L (ref 39–117)
BILIRUBIN, DIRECT: 0.13 mg/dL (ref 0.00–0.40)
Bilirubin Total: 0.4 mg/dL (ref 0.0–1.2)
Total Protein: 7.3 g/dL (ref 6.0–8.5)

## 2018-06-14 NOTE — Telephone Encounter (Signed)
Pt.notified

## 2018-06-14 NOTE — Telephone Encounter (Signed)
Call pt and ask her f she had a low speel yesterday

## 2018-06-14 NOTE — Telephone Encounter (Signed)
Left message to return call 

## 2018-06-14 NOTE — Telephone Encounter (Signed)
Shelly from [employer] calling to inform that patient glucose came back critical. Glucose was 26 yesterday.

## 2018-06-14 NOTE — Telephone Encounter (Signed)
Pt states she checks sugar before each meal and bedtime and it has been running in the 70's and 80's. She states she felt fine yesterday and did not realize she had a low spell. Last ate 6pm and took 10 units of lantus about 9pm bc sugar was 187.

## 2018-06-14 NOTE — Telephone Encounter (Signed)
I doubt it was actually that low

## 2018-06-23 ENCOUNTER — Encounter: Payer: Self-pay | Admitting: Family Medicine

## 2018-07-11 ENCOUNTER — Other Ambulatory Visit: Payer: Self-pay | Admitting: Family Medicine

## 2018-08-24 ENCOUNTER — Other Ambulatory Visit: Payer: Self-pay

## 2018-08-24 ENCOUNTER — Ambulatory Visit (INDEPENDENT_AMBULATORY_CARE_PROVIDER_SITE_OTHER): Payer: No Typology Code available for payment source | Admitting: Family Medicine

## 2018-08-24 DIAGNOSIS — R51 Headache: Secondary | ICD-10-CM | POA: Diagnosis not present

## 2018-08-24 DIAGNOSIS — R509 Fever, unspecified: Secondary | ICD-10-CM | POA: Diagnosis not present

## 2018-08-24 DIAGNOSIS — R519 Headache, unspecified: Secondary | ICD-10-CM

## 2018-08-24 NOTE — Progress Notes (Signed)
   Subjective:    Patient ID: Kimberly Becker, female    DOB: 04-Mar-1981, 38 y.o.   MRN: [id number] A/V Fever   This is a new problem. The current episode started today. Maximum temperature: 99.4. Associated symptoms include headaches. Pertinent negatives include no chest pain, congestion, coughing, ear pain, rash or wheezing. Associated symptoms comments: Body aches. Treatments tried: dayquil. The treatment provided mild relief.  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  Denies runny nose or cough had a rash several days ago but she felt it was an allergic rash because it caused her to itch a lot it is gone now she denies any tick bites denies any other rashes or bruising Virtual Visit via Video Note  I connected with Sanda A Underwood on 08/24/18 at  3:00 PM EDT by a video enabled telemedicine application and verified that I am speaking with the correct person using two identifiers.  Location: Patient: home Provider: office   I discussed the limitations of evaluation and management by telemedicine and the availability of in person appointments. The patient expressed understanding and agreed to proceed.  History of Present Illness:    Observations/Objective:   Assessment and Plan:   Follow Up Instructions:    I discussed the assessment and treatment plan with the patient. The patient was provided an opportunity to ask questions and all were answered. The patient agreed with the plan and demonstrated an understanding of the instructions.   The patient was advised to call back or seek an in-person evaluation if the symptoms worsen or if the condition fails to improve as anticipated.  I provided 15 minutes of non-face-to-face time during this encounter.     Review of Systems  Constitutional: Positive for fever. Negative for activity change.  HENT: Negative for  congestion, ear pain and rhinorrhea.   Eyes: Negative for discharge.  Respiratory: Negative for cough, shortness of breath and wheezing.   Cardiovascular: Negative for chest pain and leg swelling.  Skin: Negative for color change and rash.  Neurological: Positive for headaches.       Objective:   Physical Exam   Patient had virtual visit Appears to be in no distress Atraumatic Neuro able to relate and oriented No apparent resp distress Color normal       Assessment & Plan:  Febrile illness with headache Patient does not appear toxic on video We discussed at length how it is possible this could be coronavirus but it is also possible could be other viral illnesses she will give this the next couple days to see how she does.  I have also encouraged her to remain at home until she is fever and symptom-free for 72 hours and showing significant signs of improvement and at least 7 days out from her initial illness before returning to work I also told the patient to call us on Friday with an update if her fever body aches headache persists but no other symptoms consider tick related illnesses or other viruses. May need testing but currently local access to coronavirus testing other than ER visit is not available She will give [place] an update on Friday

## 2018-08-25 ENCOUNTER — Telehealth: Payer: Self-pay | Admitting: Family Medicine

## 2018-08-25 ENCOUNTER — Encounter: Payer: Self-pay | Admitting: Family Medicine

## 2018-08-25 NOTE — Telephone Encounter (Signed)
Work excuse done and faxed per Dr. Lilyan Punt, Jonna Coup, MD  P [facility] [employer]  Please do work excuse  It is advised for this patient due to fever to stay at home until fever is gone for at least 72 hours and at least 7 days. She may return to work next Wednesday per fax number for work is [phone] please do today

## 2018-08-29 ENCOUNTER — Telehealth: Payer: Self-pay | Admitting: Family Medicine

## 2018-08-29 NOTE — Telephone Encounter (Signed)
I touch base with the patient she states no fever since Friday had low-grade fever on Friday feeling much improved energy level is better has a mild headache no rash no body aches no runny nose or cough she plans to go back to work on Wednesday I told her she should start having any other symptomatology she needs to let us know-warning signs discussed Most likely had a viral illness unlikely to be corona unlikely to be tick related call back if worsening issues

## 2018-08-29 NOTE — Telephone Encounter (Signed)
Patient was told to call with an update.Some headache still but no fever for 3 days. Has pretty much slept for the last few days. Is planning to return to work Wednesday per work note.

## 2018-08-29 NOTE — Telephone Encounter (Signed)
Please advise 

## 2018-09-14 ENCOUNTER — Telehealth: Payer: Self-pay | Admitting: Family Medicine

## 2018-09-14 NOTE — Telephone Encounter (Signed)
I called patient aware it is printed and will pick up at the front desk.

## 2018-09-14 NOTE — Telephone Encounter (Signed)
Pt is requesting copy of her last TB test

## 2018-10-03 ENCOUNTER — Telehealth: Payer: Self-pay | Admitting: *Deleted

## 2018-10-03 NOTE — Telephone Encounter (Signed)
Pt dropped off forms to fill out for employment. Last med check up 06/13/18. I asked pt if she had physical within the last year and she states no and she does not need one for this form. I looked in chart and could not find a physical in epic. Form in dr scott's basket.

## 2018-10-05 NOTE — Telephone Encounter (Signed)
thanks

## 2018-10-05 NOTE — Telephone Encounter (Signed)
Pt is checking status of forms. I did inform pt Dr. Richardson Landry is out of office this week. She is hoping to be able to pick up these forms by Friday. She is unable to start her new job until the forms are filled in. Also pt does not have insurance until she starts her new job and is unable to purchase her insulin until she has insurance.

## 2018-10-05 NOTE — Telephone Encounter (Signed)
Nurses I filled out the form as best I could It appears that the patient will need TB testing unless it has been completed recently or she will need gold interferon blood test for TB Please see form please talk with patient regarding this  Also please asked the patient if she dealing with any type depression issues or mental health issues? 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  This information is necessary in order to complete her form Otherwise I am finished with form

## 2018-10-05 NOTE — Telephone Encounter (Signed)
PPD 04/19/2018

## 2018-10-05 NOTE — Telephone Encounter (Signed)
Form in basket- needs doctor's signature

## 2018-10-06 NOTE — Telephone Encounter (Signed)
Form  Signed Ready for patient

## 2018-10-06 NOTE — Telephone Encounter (Signed)
Form is upfront for pickup. °

## 2018-11-16 ENCOUNTER — Other Ambulatory Visit: Payer: Self-pay | Admitting: *Deleted

## 2018-11-16 MED ORDER — INSULIN LISPRO 100 UNIT/ML ~~LOC~~ SOLN: SUBCUTANEOUS | 0 refills | Status: DC

## 2018-12-14 ENCOUNTER — Ambulatory Visit (INDEPENDENT_AMBULATORY_CARE_PROVIDER_SITE_OTHER): Payer: PRIVATE HEALTH INSURANCE | Admitting: Family Medicine

## 2018-12-14 ENCOUNTER — Ambulatory Visit: Payer: No Typology Code available for payment source | Admitting: Family Medicine

## 2018-12-14 ENCOUNTER — Other Ambulatory Visit: Payer: Self-pay

## 2018-12-14 DIAGNOSIS — E109 Type 1 diabetes mellitus without complications: Secondary | ICD-10-CM | POA: Diagnosis not present

## 2018-12-14 MED ORDER — INSULIN GLARGINE 100 UNIT/ML ~~LOC~~ SOLN: 0.0000 [IU] | Freq: Every day | SUBCUTANEOUS | 1 refills | Status: DC

## 2018-12-14 MED ORDER — INSULIN LISPRO 100 UNIT/ML ~~LOC~~ SOLN: SUBCUTANEOUS | 5 refills | Status: DC

## 2018-12-14 NOTE — Progress Notes (Signed)
   Subjective:    Patient ID: Kimberly Becker, female    DOB: 1981-04-10, 38 y.o.   MRN: [id number]  bp 118/65 2 weeks ago  Diabetes She presents for her follow-up diabetic visit. She has type 1 diabetes mellitus. Current diabetic treatments: lantus, humalog. She is compliant with treatment all of the time. Home blood sugar record trend: around 112 in the mornings, around 1pm 275. She does not see a podiatrist.Eye exam is not current.   Virtual Visit via Telephone Note  I connected with Consetta A Underwood on 12/14/18 at  8:30 AM EDT by telephone and verified that I am speaking with the correct person using two identifiers.  Location: Patient: home Tanya Crothers: office   I discussed the limitations, risks, security and privacy concerns of performing an evaluation and management service by telephone and the availability of in person appointments. I also discussed with the patient that there may be a patient responsible charge related to this service. The patient expressed understanding and agreed to proceed.   History of Present Illness:    Observations/Objective:   Assessment and Plan:   Follow Up Instructions:    I discussed the assessment and treatment plan with the patient. The patient was provided an opportunity to ask questions and all were answered. The patient agreed with the plan and demonstrated an understanding of the instructions.   The patient was advised to call back or seek an in-person evaluation if the symptoms worsen or if the condition fails to improve as anticipated.  I provided 20 minutes of non-face-to-face time during this encounter.      Review of Systems No headache, no major weight loss or weight gain, no chest pain no back pain abdominal pain no change in bowel habits complete ROS otherwise negative     Objective:   Physical Exam  Virtual      Assessment & Plan:  Impression type 1 diabetes.  Patient has no insurance we have been managing  this rather successfully and she would prefer to stick with [place] in this regard.  She has adjusted her Lantus somewhat this is reflected in medications.  Insulin refilled.  Diet discussed.  Exercise discussed

## 2018-12-15 ENCOUNTER — Encounter: Payer: Self-pay | Admitting: Family Medicine

## 2019-01-24 ENCOUNTER — Telehealth: Payer: Self-pay | Admitting: Family Medicine

## 2019-01-24 MED ORDER — TRIAMCINOLONE ACETONIDE 0.1 % EX CREA: TOPICAL_CREAM | CUTANEOUS | 0 refills | Status: DC

## 2019-01-24 NOTE — Telephone Encounter (Signed)
treiamcinolone cr 0.1 60 g thin layer bid affected area

## 2019-01-24 NOTE — Telephone Encounter (Signed)
Cream sent in to pharmacy and pt is aware

## 2019-01-24 NOTE — Telephone Encounter (Signed)
Pt was helpiing her cousin move & got bit by some fleas, states she now has a rash on her arms & back, very itchy, Benadryl isn't helping  Please advise & call pt    [facility]

## 2019-01-24 NOTE — Telephone Encounter (Signed)
Please advise. Thank you

## 2019-03-22 ENCOUNTER — Telehealth: Payer: Self-pay | Admitting: Family Medicine

## 2019-03-22 NOTE — Telephone Encounter (Signed)
Pt dropped off form for doc to fill out to help get assistance on medication. Form placed in nurse box at nurse station.

## 2019-03-28 NOTE — Telephone Encounter (Signed)
Please advise. Thank you

## 2019-03-28 NOTE — Telephone Encounter (Signed)
Pt is checking on status of paperwork. She would like to pick up Friday

## 2019-05-08 ENCOUNTER — Ambulatory Visit: Payer: No Typology Code available for payment source | Attending: Internal Medicine

## 2019-05-08 ENCOUNTER — Other Ambulatory Visit: Payer: Self-pay

## 2019-05-08 DIAGNOSIS — Z20822 Contact with and (suspected) exposure to covid-19: Secondary | ICD-10-CM

## 2019-05-09 LAB — NOVEL CORONAVIRUS, NAA: SARS-CoV-2, NAA: NOT DETECTED

## 2019-05-15 ENCOUNTER — Encounter: Payer: Self-pay | Admitting: Family Medicine

## 2019-06-27 ENCOUNTER — Ambulatory Visit (INDEPENDENT_AMBULATORY_CARE_PROVIDER_SITE_OTHER): Payer: PRIVATE HEALTH INSURANCE | Admitting: Family Medicine

## 2019-06-27 ENCOUNTER — Other Ambulatory Visit: Payer: Self-pay

## 2019-06-27 VITALS — BP 128/72 | Temp 97.8°F | Wt 248.6 lb

## 2019-06-27 DIAGNOSIS — E785 Hyperlipidemia, unspecified: Secondary | ICD-10-CM

## 2019-06-27 DIAGNOSIS — R5383 Other fatigue: Secondary | ICD-10-CM

## 2019-06-27 DIAGNOSIS — Z79899 Other long term (current) drug therapy: Secondary | ICD-10-CM | POA: Diagnosis not present

## 2019-06-27 DIAGNOSIS — E109 Type 1 diabetes mellitus without complications: Secondary | ICD-10-CM | POA: Diagnosis not present

## 2019-06-27 DIAGNOSIS — I878 Other specified disorders of veins: Secondary | ICD-10-CM

## 2019-06-27 LAB — POCT GLYCOSYLATED HEMOGLOBIN (HGB A1C): Hemoglobin A1C: 6 % — AB (ref 4.0–5.6)

## 2019-06-27 MED ORDER — TRIAMCINOLONE ACETONIDE 0.1 % EX CREA: TOPICAL_CREAM | CUTANEOUS | 3 refills | Status: DC

## 2019-06-27 MED ORDER — INSULIN GLARGINE 100 UNIT/ML ~~LOC~~ SOLN: 0.0000 [IU] | Freq: Every day | SUBCUTANEOUS | 1 refills | Status: DC

## 2019-06-27 MED ORDER — TRIAMTERENE-HCTZ 37.5-25 MG PO CAPS: 1.0000 | ORAL_CAPSULE | Freq: Every morning | ORAL | 1 refills | Status: DC

## 2019-06-27 MED ORDER — INSULIN LISPRO 100 UNIT/ML ~~LOC~~ SOLN: SUBCUTANEOUS | 1 refills | Status: DC

## 2019-06-27 MED ORDER — TRIAMTERENE-HCTZ 37.5-25 MG PO CAPS: 1.0000 | ORAL_CAPSULE | ORAL | 0 refills | Status: DC

## 2019-06-27 NOTE — Progress Notes (Signed)
   Subjective:    Patient ID: Kimberly Becker, female    DOB: [birth year], 39 y.o.   MRN: [id number]  HPI  Patient states she is experiencing swelling in the legs and feet. She is also requesting medication for her eczema.  Pt notes sig retaingin of fluid  Worse in the last few weeks   Swells in the ankles and feet after up all day  Seen with the nurse reported  Results for orders placed or performed in visit on 06/27/19  POCT glycosylated hemoglobin (Hb A1C)  Result Value Ref Range   Hemoglobin A1C 6.0 (A) 4.0 - 5.6 %   HbA1c POC (<> result, manual entry)     HbA1c, POC (prediabetic range)     HbA1c, POC (controlled diabetic range)    Patient also has not had blood work screening blood work for quite some time.  No shortness of breath no orthopnea no chest pain  Patient claims compliance with diabetes medication. No obvious side effects. Reports no substantial low sugar spells. Most numbers are generally in good range when checked fasting. Generally does not miss a dose of medication. Watching diabetic diet closely  Patient claims compliance with diabetes medication. No obvious side effects. Reports no substantial low sugar spells. Most numbers are generally in good range when checked fasting. Generally does not miss a dose of medication. Watching diabetic diet closely  Patient has history of eczema.  Flaring up recently.  Steroid creams are helped in the past  Review of Systems No headache no chest pain no shortness of breath    Objective:   Physical Exam  Alert vitals stable, NAD. Blood pressure good on repeat. HEENT normal. Lungs clear. Heart regular rate and rhythm. Ankles 1+ edema bilateral pitting      Assessment & Plan:  Impression 1.  Pitting edema likely secondary to venous stasis.  Patient's blood pressure at times is somewhat borderline so will increase her Dyazide to daily.  This should help both edema and blood pressure.  2.  Eczema.  Will restore steroid  cream prescription.  Proper use discussed  3.  Insulin requiring diabetes.  Patient adjusts insulin on her own rather successfully.  Excellent A1c.  Compliance discussed.  Diet discussed  Medications refilled.  Appropriate blood work.  Follow-up in 6 months.

## 2019-06-29 ENCOUNTER — Other Ambulatory Visit: Payer: Self-pay | Admitting: *Deleted

## 2019-06-29 ENCOUNTER — Telehealth: Payer: Self-pay | Admitting: Family Medicine

## 2019-06-29 MED ORDER — TRIAMTERENE-HCTZ 37.5-25 MG PO CAPS: 1.0000 | ORAL_CAPSULE | ORAL | 4 refills | Status: DC

## 2019-06-29 NOTE — Telephone Encounter (Signed)
Patient states medications need to be sent to [facility] where it was sent she no longer use. Please update . She was seen 3/16

## 2019-06-29 NOTE — Telephone Encounter (Signed)
This pharm was not found in epic. I called pt to get phone number so I could call them [phone]. Pt told me her id was [id number]. I tried to call in refills and the lady I spoke to would not allow me to. States she already has 2 refills on her insulin and would not let me update it for 6 months. I also tried to call in a new med dr Brett Canales started her on dyazide and was not allowed to do that either. Was told pt needs to call with name of med and then they would send her paperwork to fill out and then she could give it to dr Brett Canales to fill out the rest of the paperwork and then have it sent back. I called pt and told her all of this and she said just send the [facility]ide to [facility] in [place] and I did send it in.

## 2019-07-07 LAB — HEPATIC FUNCTION PANEL
ALT: 19 IU/L (ref 0–32)
AST: 26 IU/L (ref 0–40)
Albumin: 4.3 g/dL (ref 3.8–4.8)
Alkaline Phosphatase: 99 IU/L (ref 39–117)
Bilirubin Total: 0.3 mg/dL (ref 0.0–1.2)
Bilirubin, Direct: 0.11 mg/dL (ref 0.00–0.40)
Total Protein: 7.1 g/dL (ref 6.0–8.5)

## 2019-07-07 LAB — BASIC METABOLIC PANEL
BUN/Creatinine Ratio: 16 (ref 9–23)
BUN: 14 mg/dL (ref 6–24)
CO2: 28 mmol/L (ref 20–29)
Calcium: 9.5 mg/dL (ref 8.7–10.2)
Chloride: 97 mmol/L (ref 96–106)
Creatinine, Ser: 0.86 mg/dL (ref 0.57–1.00)
GFR calc Af Amer: 98 mL/min/{1.73_m2} (ref >59)
GFR calc non Af Amer: 85 mL/min/{1.73_m2} (ref >59)
Glucose: 89 mg/dL (ref 65–99)
Potassium: 4 mmol/L (ref 3.5–5.2)
Sodium: 137 mmol/L (ref 134–144)

## 2019-07-07 LAB — LIPID PANEL
Chol/HDL Ratio: 4 ratio (ref 0.0–4.4)
Cholesterol, Total: 221 mg/dL — ABNORMAL HIGH (ref 100–199)
HDL: 55 mg/dL (ref >39)
LDL Chol Calc (NIH): 119 mg/dL — ABNORMAL HIGH (ref 0–99)
Triglycerides: 273 mg/dL — ABNORMAL HIGH (ref 0–149)
VLDL Cholesterol Cal: 47 mg/dL — ABNORMAL HIGH (ref 5–40)

## 2019-07-07 LAB — MICROALBUMIN / CREATININE URINE RATIO
Creatinine, Urine: 37.4 mg/dL
Microalb/Creat Ratio: 17 mg/g creat (ref 0–29)
Microalbumin, Urine: 6.5 ug/mL

## 2019-07-07 LAB — TSH: TSH: 2.4 u[IU]/mL (ref 0.450–4.500)

## 2019-07-09 ENCOUNTER — Encounter: Payer: Self-pay | Admitting: Family Medicine

## 2019-07-18 ENCOUNTER — Encounter: Payer: Self-pay | Admitting: Family Medicine

## 2019-07-19 ENCOUNTER — Ambulatory Visit (INDEPENDENT_AMBULATORY_CARE_PROVIDER_SITE_OTHER): Payer: PRIVATE HEALTH INSURANCE | Admitting: Family Medicine

## 2019-07-19 ENCOUNTER — Other Ambulatory Visit: Payer: Self-pay

## 2019-07-19 DIAGNOSIS — I10 Essential (primary) hypertension: Secondary | ICD-10-CM

## 2019-07-19 DIAGNOSIS — I878 Other specified disorders of veins: Secondary | ICD-10-CM | POA: Diagnosis not present

## 2019-07-19 NOTE — Progress Notes (Signed)
   Subjective:  Audio only  Patient ID: Kimberly Becker, female    DOB: 1980-09-28, 39 y.o.   MRN: [id number]  HPI Patient calls today to follow up with Dr. Brett Canales regarding her leg swelling.  Last BP check was Monday 138/62.  Patient wants to discuss starting a weight loss medication that she has taken in the last.  Has used phentermine in the past.  Handled it well.  Virtual Visit via Video Note  I connected with Rechy U Dungee on 07/19/19 at  3:00 PM EDT by a video enabled telemedicine application and verified that I am speaking with the correct person using two identifiers.  Location: Patient: home Provider: office    I discussed the limitations of evaluation and management by telemedicine and the availability of in person appointments. The patient expressed understanding and agreed to proceed.  History of Present Illness:    Observations/Objective:   Assessment and Plan:   Follow Up Instructions:    I discussed the assessment and treatment plan with the patient. The patient was provided an opportunity to ask questions and all were answered. The patient agreed with the plan and demonstrated an understanding of the instructions.   The patient was advised to call back or seek an in-person evaluation if the symptoms worsen or if the condition fails to improve as anticipated.  I provided 22 minutes of non-face-to-face time during this encounter.   Still struggling with ankle swelling Review of Systems     Objective:   Physical Exam  Virtual      Assessment & Plan:  Impression 1 venous stasis.  Ongoing challenge.  Would prefer not to increase strength of diuretic  2.  Hypertension blood pressure improving  3.  Obesity substantial.  Did well with phentermine in the past  3 months worth of phentermine prescribed.  Encouraged to consider below the knee compression stockings can try over-the-counter first  Diet exercise discussed

## 2019-07-20 ENCOUNTER — Encounter: Payer: Self-pay | Admitting: Family Medicine

## 2019-07-20 NOTE — Addendum Note (Signed)
Addended by: Haze Rushing on: 07/20/2019 04:09 PM   Modules accepted: Orders

## 2019-07-24 ENCOUNTER — Encounter: Payer: Self-pay | Admitting: Family Medicine

## 2019-07-24 MED ORDER — PHENTERMINE HCL 37.5 MG PO TABS: 37.5000 mg | ORAL_TABLET | Freq: Every day | ORAL | 2 refills | Status: DC

## 2019-07-24 NOTE — Telephone Encounter (Signed)
Merlyn Albert, MD     Phentermine 37. 5 one qam plus two ref

## 2019-09-08 ENCOUNTER — Encounter: Payer: Self-pay | Admitting: Nurse Practitioner

## 2019-09-08 ENCOUNTER — Other Ambulatory Visit: Payer: Self-pay

## 2019-09-08 ENCOUNTER — Ambulatory Visit (HOSPITAL_COMMUNITY)
Admission: RE | Admit: 2019-09-08 | Discharge: 2019-09-08 | Disposition: A | Discharge status: Home / Self Care | Payer: No Typology Code available for payment source | Source: Ambulatory Visit | Attending: Nurse Practitioner | Admitting: Nurse Practitioner

## 2019-09-08 ENCOUNTER — Other Ambulatory Visit (HOSPITAL_COMMUNITY)
Admission: RE | Admit: 2019-09-08 | Discharge: 2019-09-08 | Disposition: A | Discharge status: Home / Self Care | Payer: No Typology Code available for payment source | Source: Ambulatory Visit | Attending: Nurse Practitioner | Admitting: Nurse Practitioner

## 2019-09-08 ENCOUNTER — Ambulatory Visit: Payer: PRIVATE HEALTH INSURANCE | Admitting: Nurse Practitioner

## 2019-09-08 VITALS — BP 138/86 | Temp 98.2°F | Wt 239.8 lb

## 2019-09-08 DIAGNOSIS — R600 Localized edema: Secondary | ICD-10-CM | POA: Insufficient documentation

## 2019-09-08 DIAGNOSIS — M79661 Pain in right lower leg: Secondary | ICD-10-CM | POA: Diagnosis not present

## 2019-09-08 DIAGNOSIS — K219 Gastro-esophageal reflux disease without esophagitis: Secondary | ICD-10-CM

## 2019-09-08 DIAGNOSIS — I83891 Varicose veins of right lower extremities with other complications: Secondary | ICD-10-CM | POA: Diagnosis not present

## 2019-09-08 LAB — D-DIMER, QUANTITATIVE: D-Dimer, Quant: 0.36 ug/mL-FEU (ref 0.00–0.50)

## 2019-09-08 MED ORDER — PANTOPRAZOLE SODIUM 40 MG PO TBEC
40.0000 mg | DELAYED_RELEASE_TABLET | Freq: Every day | ORAL | 2 refills | Status: DC
Start: 2019-09-08 — End: 2019-12-12

## 2019-09-08 MED ORDER — INDAPAMIDE 2.5 MG PO TABS: 2.5000 mg | ORAL_TABLET | Freq: Every day | ORAL | 2 refills | Status: DC

## 2019-09-08 MED ORDER — MELOXICAM 15 MG PO TABS
15.0000 mg | ORAL_TABLET | Freq: Every day | ORAL | 0 refills | Status: DC
Start: 2019-09-08 — End: 2019-12-22

## 2019-09-08 NOTE — Progress Notes (Signed)
   Subjective:    Patient ID: Kimberly Becker, female    DOB: [birth year], 39 y.o.   MRN: [id number]  Leg Pain   Patient comes in today with complaints of Right leg swelling, numbness, tingling, bruising and slightly warm to touch. Patient has been taking Dyazide since having the same symptoms in the left leg in April. Patient has not noticed getting rid of any fluid since starting med.  Patient also started phentermine in April and has not had any weight loss despite change in diet and cutting out sodas.     Review of Systems     Objective:   Physical Exam        Assessment & Plan:

## 2019-09-08 NOTE — Progress Notes (Signed)
Subjective:    Patient ID: Kimberly Becker, female    DOB: August 05, 1980, 39 y.o.   MRN: [id number]  HPI Presents today for complaints of swelling in lower extremities, bilaterally.Reports numbness, tingling, bruising and warmness to touch in right leg without injury. States she had a appointment 1 month ago with Dr. Gerda Diss for elevated blood pressure. Reports blood pressure came down but edema got well for a short time but is now worse. No estrogen use. Denies tobacco and alcohol use. No orthopnea. Blood sugars are running well around 150-170 highest. Also having frequent reflux. Takes about 4-5 TUMS per day. No caffeine use for 3 months.. Limited citrus. Unassociated with particular foods.     Review of Systems  Constitutional: Negative for fever.  Respiratory: Negative for cough, chest tightness and shortness of breath.   Cardiovascular: Positive for leg swelling. Negative for chest pain and palpitations.       Objective:   Physical Exam Vitals reviewed.  Constitutional:      General: She is not in acute distress. Cardiovascular:     Rate and Rhythm: Normal rate and regular rhythm.     Heart sounds: Normal heart sounds.  Pulmonary:     Effort: Pulmonary effort is normal.     Breath sounds: Normal breath sounds.  Abdominal:     Palpations: Abdomen is soft.     Tenderness: There is no abdominal tenderness.  Neurological:     Mental Status: She is alert.    Measurements of Lower mid calf: Left leg: 48.5 cm. Right leg: 46.5 cm. Area of very faint erythema along the mid to lower medial aspect of the right lower leg, tender to touch. No difference in edema of the ankles and feet. DP pulses present and normal cap refill in the toes bilaterally. Faint linear purplish areas along the inner lower right leg consistent with varicose veins noted.  Wells score 1 for DVT.  D Dimer normal with neg [place] right lower leg. Patient advised of results.       Assessment & Plan:   Problem List  Items Addressed This Visit    None    Visit Diagnoses    Varicose veins of right leg with edema    -  Primary   Pain in right lower leg       Relevant Orders   D-Dimer, Quantitative   US Venous Img Lower Unilateral Right (Completed)   Gastroesophageal reflux disease without esophagitis       Bilateral leg edema       Relevant Orders   D-Dimer, Quantitative     Meds ordered this encounter  Medications  . indapamide (LOZOL) 2.5 MG tablet    Sig: Take 1 tablet (2.5 mg total) by mouth daily. For fluid and BP    Dispense:  30 tablet    Refill:  2    Order Specific Question:   Supervising Provider    Answer:   Lilyan Punt A [[id number]]  . pantoprazole (PROTONIX) 40 MG tablet    Sig: Take 1 tablet (40 mg total) by mouth daily. Prn acid reflux    Dispense:  30 tablet    Refill:  2    Order Specific Question:   Supervising Provider    Answer:   Lilyan Punt A [[id number]]  . meloxicam (MOBIC) 15 MG tablet    Sig: Take 1 tablet (15 mg total) by mouth daily. 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 Trial of Pantoprazole.  Limited Meloxicam use as directed with food for leg pain. DC if reflux gets worse. Warm compresses to right lower leg. Warning signs reviewed. Call back in one week if no improvement, sooner if worse. Stop Dyazide. Start Indapamide.  Recommend follow up in one month for reflux and edema.

## 2019-09-10 ENCOUNTER — Encounter: Payer: Self-pay | Admitting: Nurse Practitioner

## 2019-10-23 ENCOUNTER — Encounter: Payer: Self-pay | Admitting: Nurse Practitioner

## 2019-10-24 ENCOUNTER — Other Ambulatory Visit: Payer: Self-pay | Admitting: Nurse Practitioner

## 2019-10-26 ENCOUNTER — Other Ambulatory Visit: Payer: Self-pay | Admitting: Nurse Practitioner

## 2019-10-26 MED ORDER — INSULIN ASPART 100 UNIT/ML FLEXPEN: PEN_INJECTOR | SUBCUTANEOUS | 11 refills | Status: DC

## 2019-12-07 ENCOUNTER — Other Ambulatory Visit: Payer: Self-pay | Admitting: Nurse Practitioner

## 2019-12-08 ENCOUNTER — Encounter: Payer: Self-pay | Admitting: Nurse Practitioner

## 2019-12-11 ENCOUNTER — Other Ambulatory Visit: Payer: Self-pay | Admitting: Nurse Practitioner

## 2019-12-20 ENCOUNTER — Encounter: Payer: Self-pay | Admitting: Nurse Practitioner

## 2019-12-22 ENCOUNTER — Telehealth: Payer: Self-pay | Admitting: Family Medicine

## 2019-12-22 ENCOUNTER — Ambulatory Visit (INDEPENDENT_AMBULATORY_CARE_PROVIDER_SITE_OTHER): Payer: 59 | Admitting: Nurse Practitioner

## 2019-12-22 ENCOUNTER — Other Ambulatory Visit: Payer: Self-pay

## 2019-12-22 ENCOUNTER — Encounter: Payer: Self-pay | Admitting: Nurse Practitioner

## 2019-12-22 VITALS — BP 138/70 | HR 116 | Temp 97.8°F | Ht 62.0 in | Wt 252.4 lb

## 2019-12-22 DIAGNOSIS — Z79899 Other long term (current) drug therapy: Secondary | ICD-10-CM

## 2019-12-22 DIAGNOSIS — E785 Hyperlipidemia, unspecified: Secondary | ICD-10-CM | POA: Diagnosis not present

## 2019-12-22 DIAGNOSIS — E119 Type 2 diabetes mellitus without complications: Secondary | ICD-10-CM

## 2019-12-22 LAB — POCT GLYCOSYLATED HEMOGLOBIN (HGB A1C): Hemoglobin A1C: 6.5 % — AB (ref 4.0–5.6)

## 2019-12-22 MED ORDER — INDAPAMIDE 2.5 MG PO TABS: ORAL_TABLET | ORAL | 2 refills | Status: DC

## 2019-12-22 MED ORDER — ROSUVASTATIN CALCIUM 5 MG PO TABS: 5.0000 mg | ORAL_TABLET | Freq: Every day | ORAL | 2 refills | Status: DC

## 2019-12-22 MED ORDER — PANTOPRAZOLE SODIUM 40 MG PO TBEC: DELAYED_RELEASE_TABLET | ORAL | 2 refills | Status: DC

## 2019-12-22 MED ORDER — FREESTYLE LIBRE 2 SENSOR MISC: 11 refills | Status: DC

## 2019-12-22 NOTE — Telephone Encounter (Signed)
Pt calling to let Eber Jones know insurance will cover the freestyle libra and would like it sent to [id number] - [place], [place] - [address]

## 2019-12-22 NOTE — Telephone Encounter (Signed)
Done

## 2019-12-22 NOTE — Telephone Encounter (Signed)
Discussed with patient at visit on 9/10

## 2019-12-22 NOTE — Progress Notes (Signed)
Subjective:    Patient ID: Kimberly Becker, female    DOB: [birth year], 39 y.o.   MRN: [id number]  HPI: med check up.  Patient reports feeling well, improved pain and swelling in lower extremities. Sweling only occurs when sitting or standing for long periods of time. Has been taking BP at home and reports they have been well controlled. Checks BG periodically at home, CBG's range from 120-130s. reports only one hypoglycemic episode which occurred last week. Patient has improved her eating habits and reports attempts to increase exercise. 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  Patients cholesterol was elevated 6 months ago, started on omega 3s.  Previously having acid reflux now improved with Protonix.  Concerns about carpal tunnel. Pain wakes her at night. Uses braces. Heat temporarily helps.  Hd eye exam last year and will have another scheduled for this year. Pap smear completed at another facility, reports normal findings.   Would like a flu vaccine.       Review of Systems  Respiratory: Negative for cough, chest tightness and shortness of breath.   Cardiovascular: Positive for leg swelling. Negative for chest pain and palpitations.       Much improved from previous visits.   Gastrointestinal: Negative for abdominal distention, abdominal pain, blood in stool, constipation, diarrhea, nausea and vomiting.       Denies reflux since starting protonix.  Genitourinary:       Denies any changes in urine color or odor, dysuria, urgency or frequency.   Neurological: Positive for numbness. Negative for dizziness and weakness.       Bilateral carpal tunnel pain, worse on the right.       Objective:   Physical Exam Vitals reviewed.  Constitutional:      Appearance: Normal appearance. She is well-groomed.  Cardiovascular:     Rate and Rhythm: Normal rate and regular rhythm.     Pulses: Normal pulses.          Dorsalis pedis pulses are 2+ on the right side and  2+ on the left side.       Posterior tibial pulses are 2+ on the right side and 2+ on the left side.     Heart sounds: Normal heart sounds.  Pulmonary:     Effort: Pulmonary effort is normal. No respiratory distress.     Breath sounds: Normal breath sounds. No wheezing or rhonchi.  Musculoskeletal:     Right lower leg: No edema.     Left lower leg: No edema.     Comments: +phalen and tinel test bilaterally  Feet:     Right foot:     Skin integrity: Skin integrity normal. No ulcer, blister or skin breakdown.     Toenail Condition: Right toenails are normal.     Left foot:     Skin integrity: Skin integrity normal. No ulcer, blister or skin breakdown.     Toenail Condition: Left toenails are normal.  Skin:    General: Skin is warm and dry.     Capillary Refill: Capillary refill takes 2 to 3 seconds.  Neurological:     Mental Status: She is alert.     Diabetic Foot Exam - Simple   Simple Foot Form Visual Inspection No deformities, no ulcerations, no other skin breakdown bilaterally: Yes Sensation Testing Intact to touch and monofilament testing bilaterally: Yes Pulse Check Posterior Tibialis and Dorsalis pulse intact bilaterally: Yes Comments     Today's Vitals  12/22/19 1021  BP: 138/70  Pulse: (!) 116  Temp: 97.8 F (36.6 C)  SpO2: 100%  Weight: 114.5 kg  Height: 5\' 2"  (1.575 m)   Body mass index is 46.16 kg/m.        Assessment & Plan:   Problem List Items Addressed This Visit      Other   Hyperlipidemia   Relevant Medications   aspirin EC 81 MG tablet   indapamide (LOZOL) 2.5 MG tablet   rosuvastatin (CRESTOR) 5 MG tablet   Other Relevant Orders   Lipid panel    Other Visit Diagnoses    Diabetes mellitus without complication (HCC)    -  Primary   Relevant Medications   aspirin EC 81 MG tablet   rosuvastatin (CRESTOR) 5 MG tablet   Other Relevant Orders   POCT glycosylated hemoglobin (Hb A1C) (Completed)   High risk medication use        Relevant Orders   Hepatic function panel     Meds ordered this encounter  Medications  . Continuous Blood Gluc Sensor (FREESTYLE LIBRE 2 SENSOR) MISC    Sig: Apply sensor to skin as directed for 14 days, then remove and replace.    Dispense:  2 each    Refill:  11    Order Specific Question:   Supervising Provider    Answer:   A [[id number]]  . indapamide (LOZOL) 2.5 MG tablet    Sig: TAKE 1 TABLET BY MOUTH ONCE DAILY FOR BLOOD PRESSURE AND FLUID.    Dispense:  30 tablet    Refill:  2    Order Specific Question:   Supervising Provider    Answer:   Lilyan Punt A Lilyan Punt  . pantoprazole (PROTONIX) 40 MG tablet    Sig: TAKE 1 TABLET BY MOUTH ONCE DAILY AS NEEDED FOR ACID REFLUX    Dispense:  30 tablet    Refill:  2    Order Specific Question:   Supervising Provider    Answer:   [id number] [[id number]]  . rosuvastatin (CRESTOR) 5 MG tablet    Sig: Take 1 tablet (5 mg total) by mouth daily. For cholesterol    Dispense:  30 tablet    Refill:  2    Order Specific Question:   Supervising Provider    Answer:   Lilyan Punt [[id number]]    Freestyle libra placed per patient request in office. Instructions provided and patient to follow up with insurance for coverage information.  Encouraged hand specialist for carpal tunnel, patient will confirm with insurance for coverage.  Education provided for balanced diet and increasing exercise. Encouraged to continue self foot exams and to notify provider for any changes.  Labs reviewed with patient, will initiate Rosuvastatin to improve cholesterol.  Informed patient to wait a couple weeks following 2nd Covid vaccine for her flu vaccine, 2nd Moderna shot scheduled for next Friday. Follow up lab work for 8 weeks from now.  Recheck in 3 months.

## 2019-12-22 NOTE — Progress Notes (Signed)
   Subjective:    Patient ID: Kimberly Becker, female    DOB: 06-04-1980, 39 y.o.   MRN: [id number]  HPI    Review of Systems     Objective:   Physical Exam        Assessment & Plan:

## 2019-12-25 ENCOUNTER — Encounter: Payer: Self-pay | Admitting: Nurse Practitioner

## 2019-12-29 ENCOUNTER — Other Ambulatory Visit: Payer: Self-pay | Admitting: Nurse Practitioner

## 2020-01-16 ENCOUNTER — Encounter: Payer: Self-pay | Admitting: Adult Health

## 2020-01-16 ENCOUNTER — Ambulatory Visit (INDEPENDENT_AMBULATORY_CARE_PROVIDER_SITE_OTHER): Payer: 59 | Admitting: Adult Health

## 2020-01-16 VITALS — BP 142/79 | HR 107 | Ht 63.0 in | Wt 250.0 lb

## 2020-01-16 DIAGNOSIS — Z975 Presence of (intrauterine) contraceptive device: Secondary | ICD-10-CM | POA: Diagnosis not present

## 2020-01-16 DIAGNOSIS — N926 Irregular menstruation, unspecified: Secondary | ICD-10-CM | POA: Insufficient documentation

## 2020-01-16 MED ORDER — LARIN FE 1.5/30 1.5-30 MG-MCG PO TABS: 1.0000 | ORAL_TABLET | Freq: Every day | ORAL | 3 refills | Status: DC

## 2020-01-16 NOTE — Progress Notes (Signed)
°  Subjective:     Patient ID: Kimberly Becker, female   DOB: 1981-01-31, 39 y.o.   MRN: [id number]  HPI Kimberly Becker is a 39 year old white female,married, G2P0, in complaining of bleeding on Nexplanon, was started on [place] 1.5/30 fe,by Dr Ralph Dowdy  and bleeding has stopped. Had normal pap and normal endometrial biopsy this year as well as normal GYN [place] to assess the bleeding.Reviewed chart from Dr Ralph Dowdy. PCP is Dr Lilyan Punt.   Review of Systems +bleeding with nexplanon that stopped with COs. Reviewed past medical,surgical, social and family history. Reviewed medications and allergies.     Objective:   Physical Exam BP (!) 142/79 (BP Location: Left Arm, Patient Position: Sitting, Cuff Size: Normal)    Pulse (!) 107    Ht 5\' 3"  (1.6 m)    Wt 250 lb (113.4 kg)    BMI 44.29 kg/m  Skin warm and dry. Neck: mid line trachea, normal thyroid, good ROM, no lymphadenopathy noted. Lungs: clear to ausculation bilaterally. Cardiovascular: regular rate and rhythm. AA is 0  PHQ 9 score is 0  [facility] - 01/16/20 1130      Pregnancy Intention Screening   Does the patient want to become pregnant in the next year? Yes    Does the patient's partner want to become pregnant in the next year? Yes    Would the patient like to discuss contraceptive options today? No      Contraception Wrap Up   Current Method Hormonal Implant    End Method Hormonal Implant    Contraception Counseling Provided No             Assessment:     1. Irregular bleeding Discussed options of leaving nexplanon in place and taking OCs or megace and will continue OCs and remove nexplanon  - will refill and return in 2 weeks for nexplanon removal Meds ordered this encounter  Medications   LARIN FE 1.5/30 1.5-30 MG-MCG tablet    Sig: Take 1 tablet by mouth daily.    Dispense:  84 tablet    Refill:  3    Order Specific Question:   Supervising Provider    Answer:   02-14-1978, LUTHER H [[id number]]    2. Nexplanon in place    Plan:      Continue OCs Return in 2 weeks for nexplanon removal

## 2020-01-30 ENCOUNTER — Encounter: Payer: Self-pay | Admitting: Adult Health

## 2020-01-30 ENCOUNTER — Ambulatory Visit (INDEPENDENT_AMBULATORY_CARE_PROVIDER_SITE_OTHER): Payer: 59 | Admitting: Adult Health

## 2020-01-30 ENCOUNTER — Other Ambulatory Visit: Payer: Self-pay

## 2020-01-30 VITALS — BP 128/78 | HR 88 | Ht 63.0 in | Wt 251.0 lb

## 2020-01-30 DIAGNOSIS — Z3046 Encounter for surveillance of implantable subdermal contraceptive: Secondary | ICD-10-CM

## 2020-01-30 NOTE — Progress Notes (Signed)
  Subjective:     Patient ID: Kimberly Becker, female   DOB: 05-01-1980, 39 y.o.   MRN: [id number]  HPI Brynleigh is a 39 year old white female,married, G2P0020 in for nexplanon removal, already on OCs.  PCP is [employer] .   Review of Systems For nexplanon removal Reviewed past medical,surgical, social and family history. Reviewed medications and allergies.     Objective:   Physical Exam BP 128/78 (BP Location: Right Arm, Patient Position: Sitting, Cuff Size: Large)   Pulse 88   Ht 5\' 3"  (1.6 m)   Wt 251 lb (113.9 kg)   LMP 01/18/2020 (Approximate)   BMI 44.46 kg/m   Consent signed and time out called. Left arm cleansed with betadine, and injected with 1.5 cc 2% lidocaine and waited til numb.Under sterile technique a #11 blade was used to make small vertical incision, and a curved forceps was used to easily remove rod. Steri strips applied. Pressure dressing applied.  [facility] - 01/30/20 1152      Pregnancy Intention Screening   Does the patient want to become pregnant in the next year? Unsure    Does the patient's partner want to become pregnant in the next year? Unsure    Would the patient like to discuss contraceptive options today? No      Contraception Wrap Up   Current Method Hormonal Implant    End Method Oral Contraceptive    Contraception Counseling Provided No             Assessment:     1. Nexplanon removal     Plan:    -continue OCs,  Use condoms, keep clean and dry x 24 hours, no heavy lifting, keep steri strips on x 72 hours, Keep pressure dressing on x 24 hours. Follow up prn problems.

## 2020-01-30 NOTE — Patient Instructions (Signed)
Use condoms, keep clean and dry x 24 hours, no heavy lifting, keep steri strips on x 72 hours, Keep pressure dressing on x 24 hours. Follow up prn problems.  

## 2020-03-22 ENCOUNTER — Encounter: Payer: Self-pay | Admitting: Nurse Practitioner

## 2020-03-22 ENCOUNTER — Other Ambulatory Visit: Payer: Self-pay

## 2020-03-22 ENCOUNTER — Ambulatory Visit (INDEPENDENT_AMBULATORY_CARE_PROVIDER_SITE_OTHER): Payer: 59 | Admitting: Nurse Practitioner

## 2020-03-22 VITALS — BP 132/80 | HR 112 | Temp 97.6°F | Wt 255.6 lb

## 2020-03-22 DIAGNOSIS — E785 Hyperlipidemia, unspecified: Secondary | ICD-10-CM

## 2020-03-22 DIAGNOSIS — Z79899 Other long term (current) drug therapy: Secondary | ICD-10-CM

## 2020-03-22 DIAGNOSIS — R600 Localized edema: Secondary | ICD-10-CM

## 2020-03-22 DIAGNOSIS — E1159 Type 2 diabetes mellitus with other circulatory complications: Secondary | ICD-10-CM

## 2020-03-22 DIAGNOSIS — E1069 Type 1 diabetes mellitus with other specified complication: Secondary | ICD-10-CM

## 2020-03-22 DIAGNOSIS — L309 Dermatitis, unspecified: Secondary | ICD-10-CM

## 2020-03-22 DIAGNOSIS — Z23 Encounter for immunization: Secondary | ICD-10-CM | POA: Diagnosis not present

## 2020-03-22 DIAGNOSIS — I152 Hypertension secondary to endocrine disorders: Secondary | ICD-10-CM

## 2020-03-22 MED ORDER — TRIAMCINOLONE ACETONIDE 0.1 % EX CREA: 1.0000 "application " | TOPICAL_CREAM | Freq: Two times a day (BID) | CUTANEOUS | 0 refills | Status: DC

## 2020-03-22 NOTE — Progress Notes (Signed)
Subjective:    Patient ID: Kimberly Becker, female    DOB: [birth year], 39 y.o.   MRN: [id number]  HPI Pt here for follow up on Indapamide 2.5 mg and rosuvastatin 5 mg.  Patient reports feeling well, improved pain and swelling in lower extremities. Swelling mild when sitting or standing for long periods of time. Has been taking BP at home and reports they have been well controlled. Checks BG periodically at home, CBG's range from 120-130s. Pt does report one hyperglycemic episode which occurred last week when eating large meal. Overall patient feels her eating habits have improved but exercise is very limited due to time constraints. 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 Patients cholesterol was elevated 6 months ago, started on Crestor with no complications.  Acid reflux now improved with Protonix. Had eye exam, dental exam, and DM foot exam this year. Pap smear completed at another facility, reports normal findings. Received both COVID vaccines. Patient would like a flu vaccine today. Recent ER visit 01/2020 for constipation for which patient has started Miralax daily and reports improvement. Nexplanon removed. Reports regular periods and flow with no contraceptive use at this time. Patient is married with same sexual partner and declines contraception at this time. Reports Eczema flare to bilaterally elbow. Has a remote history of eczema. Patient states she has been under unusual stress related to the recent death of two family members. She has not changed soaps or detergents and is using Aveeno to assist with skin dryness, which she feels helps some.   Review of Systems  Constitutional: Negative for fatigue and fever.  HENT: Negative for congestion.   Respiratory: Negative for cough, chest tightness, shortness of breath and wheezing.   Cardiovascular: Positive for leg swelling. Negative for chest pain and palpitations.        Mild, but improved  Gastrointestinal: Negative for abdominal distention, abdominal pain, constipation and diarrhea.  Endocrine: Negative for polydipsia, polyphagia and polyuria.  Genitourinary: Negative for menstrual problem.  Skin:       Eczema to bilateral elbows  Neurological: Negative for dizziness, syncope, light-headedness and headaches.  Psychiatric/Behavioral: Negative for sleep disturbance.       Objective:   Physical Exam Constitutional:      General: She is not in acute distress.    Appearance: Normal appearance. She is obese.  Cardiovascular:     Rate and Rhythm: Normal rate and regular rhythm.     Heart sounds: Normal heart sounds. No murmur heard. No friction rub. No gallop.   Pulmonary:     Effort: Pulmonary effort is normal. No respiratory distress.     Breath sounds: Normal breath sounds. No wheezing or rhonchi.  Musculoskeletal:     Right lower leg: No tenderness. 1+ Pitting Edema present.     Left lower leg: No tenderness. 1+ Pitting Edema present.  Skin:    General: Skin is warm and dry.       Neurological:     Mental Status: She is alert and oriented to person, place, and time. Mental status is at baseline.  Psychiatric:        Mood and Affect: Mood normal.   Patches of dry non erythematous papules noted on the extensor surface on both elbows.  Today's Vitals   03/22/20 0856  BP: 132/80  Pulse: (!) 112  Temp: 97.6 F (36.4 C)  SpO2: 92%  Weight: 255 lb 9.6 oz (  115.9 kg)   Body mass index is 45.28 kg/m.       Assessment & Plan:    Problem List Items Addressed This Visit      Cardiovascular and Mediastinum   Hypertension associated with diabetes (HCC)     Endocrine   Type 1 diabetes mellitus (HCC)   Relevant Orders   Hemoglobin A1c     Other   Hyperlipidemia - Primary   Relevant Orders   Lipid panel   Morbid obesity (HCC)    Other Visit Diagnoses    Need for vaccination       Relevant Orders   Flu Vaccine QUAD 6+ mos PF  IM (Fluarix Quad PF) (Completed)   Eczema of both upper extremities       Fluid retention in legs       High risk medication use       Relevant Orders   Hepatic function panel     Meds ordered this encounter  Medications   triamcinolone (KENALOG) 0.1 %    Sig: Apply 1 application topically 2 (two) times daily. Prn rash; use up to 2 weeks    Dispense:  30 g    Refill:  0    Order Specific Question:   Supervising Provider    Answer:   Lilyan Punt A [[id number]]    Recheck A1c, Hepatic and Lipid Panel next week when fasting. Flu Vaccine today Continue current dietary modifications ie: low sugar/low carb/low sodium Discussed weight management, exercise, and healthy weight loss goals of 2lbs/week with patient to reduce BMI and help with leg swelling.  Educated patient to use Ceravae for daily skin hydration. Trimcinolone Cream to elbows for Eczema as directed.  Continue current medication regimen. Continue glucose monitoring via [employer] b/p monitoring at home.  Return in about 3 months (around 06/20/2020) for med follow up.

## 2020-03-22 NOTE — Patient Instructions (Addendum)
A1c, Hepatic and Lipid Panel Fasting (next week)  Flu Vaccine today Weight Management to reduce BMI and help with swelling. Healthy goal 2 lbs /week OTC Unscented lotion for eczema Ceravae  Trimcinolone Cream for Eczema BID for two weeks.

## 2020-03-23 ENCOUNTER — Encounter: Payer: Self-pay | Admitting: Nurse Practitioner

## 2020-03-25 ENCOUNTER — Other Ambulatory Visit: Payer: Self-pay | Admitting: *Deleted

## 2020-03-25 ENCOUNTER — Encounter: Payer: Self-pay | Admitting: Nurse Practitioner

## 2020-03-25 ENCOUNTER — Other Ambulatory Visit: Payer: Self-pay | Admitting: Nurse Practitioner

## 2020-03-25 MED ORDER — ROSUVASTATIN CALCIUM 5 MG PO TABS: 5.0000 mg | ORAL_TABLET | Freq: Every day | ORAL | 2 refills | Status: DC

## 2020-03-29 ENCOUNTER — Encounter: Payer: Self-pay | Admitting: Nurse Practitioner

## 2020-03-29 ENCOUNTER — Other Ambulatory Visit: Payer: Self-pay | Admitting: Nurse Practitioner

## 2020-03-29 DIAGNOSIS — Z79899 Other long term (current) drug therapy: Secondary | ICD-10-CM

## 2020-03-29 DIAGNOSIS — R748 Abnormal levels of other serum enzymes: Secondary | ICD-10-CM

## 2020-03-29 LAB — HEPATIC FUNCTION PANEL
ALT: 60 IU/L — ABNORMAL HIGH (ref 0–32)
AST: 53 IU/L — ABNORMAL HIGH (ref 0–40)
Albumin: 4.2 g/dL (ref 3.8–4.8)
Alkaline Phosphatase: 107 IU/L (ref 44–121)
Bilirubin Total: 0.5 mg/dL (ref 0.0–1.2)
Bilirubin, Direct: 0.18 mg/dL (ref 0.00–0.40)
Total Protein: 6.8 g/dL (ref 6.0–8.5)

## 2020-03-29 LAB — LIPID PANEL
Chol/HDL Ratio: 2.7 ratio (ref 0.0–4.4)
Cholesterol, Total: 169 mg/dL (ref 100–199)
HDL: 63 mg/dL (ref >39)
LDL Chol Calc (NIH): 87 mg/dL (ref 0–99)
Triglycerides: 109 mg/dL (ref 0–149)
VLDL Cholesterol Cal: 19 mg/dL (ref 5–40)

## 2020-03-29 LAB — HEMOGLOBIN A1C
Est. average glucose Bld gHb Est-mCnc: 146 mg/dL
Hgb A1c MFr Bld: 6.7 % — ABNORMAL HIGH (ref 4.8–5.6)

## 2020-03-30 ENCOUNTER — Encounter: Payer: Self-pay | Admitting: Nurse Practitioner

## 2020-04-01 ENCOUNTER — Other Ambulatory Visit: Payer: Self-pay | Admitting: Nurse Practitioner

## 2020-04-01 DIAGNOSIS — R600 Localized edema: Secondary | ICD-10-CM

## 2020-04-01 DIAGNOSIS — R609 Edema, unspecified: Secondary | ICD-10-CM

## 2020-04-01 DIAGNOSIS — E1069 Type 1 diabetes mellitus with other specified complication: Secondary | ICD-10-CM

## 2020-04-01 MED ORDER — FUROSEMIDE 20 MG PO TABS
ORAL_TABLET | ORAL | 2 refills | Status: DC
Start: 2020-04-01 — End: 2020-06-19

## 2020-04-01 MED ORDER — POTASSIUM CHLORIDE ER 10 MEQ PO TBCR: EXTENDED_RELEASE_TABLET | ORAL | 2 refills | Status: DC

## 2020-04-04 ENCOUNTER — Encounter: Payer: Self-pay | Admitting: Nurse Practitioner

## 2020-04-04 LAB — BASIC METABOLIC PANEL
BUN/Creatinine Ratio: 18 (ref 9–23)
BUN: 14 mg/dL (ref 6–24)
CO2: 25 mmol/L (ref 20–29)
Calcium: 9.8 mg/dL (ref 8.7–10.2)
Chloride: 100 mmol/L (ref 96–106)
Creatinine, Ser: 0.79 mg/dL (ref 0.57–1.00)
GFR calc Af Amer: 108 mL/min/{1.73_m2} (ref >59)
GFR calc non Af Amer: 94 mL/min/{1.73_m2} (ref >59)
Glucose: 92 mg/dL (ref 65–99)
Potassium: 4 mmol/L (ref 3.5–5.2)
Sodium: 140 mmol/L (ref 134–144)

## 2020-04-04 LAB — HEPATIC FUNCTION PANEL
ALT: 89 IU/L — ABNORMAL HIGH (ref 0–32)
AST: 86 IU/L — ABNORMAL HIGH (ref 0–40)
Albumin: 4 g/dL (ref 3.8–4.8)
Alkaline Phosphatase: 93 IU/L (ref 44–121)
Bilirubin Total: 0.6 mg/dL (ref 0.0–1.2)
Bilirubin, Direct: 0.16 mg/dL (ref 0.00–0.40)
Total Protein: 6.8 g/dL (ref 6.0–8.5)

## 2020-04-04 LAB — BRAIN NATRIURETIC PEPTIDE: BNP: 5 pg/mL (ref 0.0–100.0)

## 2020-04-08 ENCOUNTER — Other Ambulatory Visit: Payer: Self-pay | Admitting: *Deleted

## 2020-04-08 DIAGNOSIS — R748 Abnormal levels of other serum enzymes: Secondary | ICD-10-CM

## 2020-04-10 ENCOUNTER — Other Ambulatory Visit: Payer: Self-pay | Admitting: Nurse Practitioner

## 2020-04-10 DIAGNOSIS — Z79899 Other long term (current) drug therapy: Secondary | ICD-10-CM

## 2020-04-10 DIAGNOSIS — R748 Abnormal levels of other serum enzymes: Secondary | ICD-10-CM

## 2020-04-10 LAB — HEPATITIS B SURFACE ANTIGEN: Hepatitis B Surface Ag: NEGATIVE

## 2020-04-10 LAB — FERRITIN: Ferritin: 57 ng/mL (ref 15–150)

## 2020-04-10 LAB — HEPATITIS C ANTIBODY: Hep C Virus Ab: <0.1 s/co ratio (ref 0.0–0.9)

## 2020-04-10 MED ORDER — VALSARTAN 40 MG PO TABS: 40.0000 mg | ORAL_TABLET | Freq: Every day | ORAL | 2 refills | Status: DC

## 2020-04-11 ENCOUNTER — Telehealth (INDEPENDENT_AMBULATORY_CARE_PROVIDER_SITE_OTHER): Payer: 59 | Admitting: Family Medicine

## 2020-04-11 ENCOUNTER — Other Ambulatory Visit: Payer: Self-pay

## 2020-04-11 DIAGNOSIS — R112 Nausea with vomiting, unspecified: Secondary | ICD-10-CM

## 2020-04-11 DIAGNOSIS — R1110 Vomiting, unspecified: Secondary | ICD-10-CM | POA: Insufficient documentation

## 2020-04-11 MED ORDER — ONDANSETRON 8 MG PO TBDP: 8.0000 mg | ORAL_TABLET | Freq: Three times a day (TID) | ORAL | 0 refills | Status: DC | PRN

## 2020-04-11 NOTE — Progress Notes (Signed)
Patient ID: Kimberly Becker, female    DOB: 12-Mar-1981, 39 y.o.   MRN: [id number]   Chief Complaint  Patient presents with  . Vomiting   Subjective:  CC: vomiting and diarrhea  This is a new problem.  Presents today via video visit with a complaint of vomiting and diarrhea symptoms started at 7:00 this morning.  Reports she is unable to keep any food or drink down at this time.  She last vomited at 1030 this morning, reports the diarrhea is "constant "she last urinated about 2 hours ago reports that the urine is darker than normal.  She is vaccinated against COVID-19, has not received her booster yet reports abdominal pain, describes this as "shooting pain "has a blood pressure cuff and pulse oximeter, blood pressure readings 142/84-heart rate 122-oxygen saturation 97%.  Pain is rated as 8/10.  Appears very uncomfortable with movement.  Denies fever, chills, shortness of breath.  vomiting and diarrhea started this morning around 7am. Has not ate or drink anything today. No fever.   Virtual Visit via Video Note  I connected with Kimberly Becker on 04/11/20 at  2:30 PM EST by a video enabled telemedicine application and verified that I am speaking with the correct person using two identifiers.  Location: Patient: home Provider: office   I discussed the limitations of evaluation and management by telemedicine and the availability of in person appointments. The patient expressed understanding and agreed to proceed.  History of Present Illness:    Observations/Objective:   Assessment and Plan:   Follow Up Instructions:    I discussed the assessment and treatment plan with the patient. The patient was provided an opportunity to ask questions and all were answered. The patient agreed with the plan and demonstrated an understanding of the instructions.   The patient was advised to call back or seek an in-person evaluation if the symptoms worsen or if the condition fails to improve  as anticipated.  I provided 20 minutes of non-face-to-face time during this encounter.      Medical History Kimberly Becker has a past medical history of Anxiety, History of chickenpox, Migraine headache, PCOS (polycystic ovarian syndrome), and Type I (juvenile type) diabetes mellitus without mention of complication, not stated as uncontrolled.   Outpatient Encounter Medications as of 04/11/2020  Medication Sig  . aspirin EC 81 MG tablet Take 81 mg by mouth daily. Swallow whole.  . Continuous Blood Gluc Sensor (FREESTYLE LIBRE 2 SENSOR) MISC Apply sensor to skin as directed for 14 days, then remove and replace.  . furosemide (LASIX) 20 MG tablet Take one pill each morning prn swelling  . insulin aspart (NOVOLOG) 100 UNIT/ML FlexPen INJECT 0-20 UNITS SUBCUTANEOUSLY 4 TIMES DAILY BEFORE MEALS AND AT BEDTIME AS PER SLIDING SCALE,  . insulin glargine (LANTUS) 100 UNIT/ML injection Inject 0-0.2 mLs (0-20 Units total) into the skin at bedtime. Pt uses as needed per sliding scale.  BS less than 120:  No insulin  . Multiple Vitamin (MULTIVITAMIN WITH MINERALS) TABS tablet Take 1 tablet by mouth daily.  . ondansetron (ZOFRAN ODT) 8 MG disintegrating tablet Take 1 tablet (8 mg total) by mouth every 8 (eight) hours as needed for nausea or vomiting.  Marland Kitchen OVER THE COUNTER MEDICATION Omega red  . pantoprazole (PROTONIX) 40 MG tablet TAKE 1 TABLET BY MOUTH ONCE DAILY AS NEEDED FOR ACID REFLUX  . potassium chloride (KLOR-CON) 10 MEQ tablet Take one tab each time you take Furosemide  . triamcinolone (KENALOG) 0.1 %  Apply 1 application topically 2 (two) times daily. Prn rash; use up to 2 weeks  . valsartan (DIOVAN) 40 MG tablet Take 1 tablet (40 mg total) by mouth daily. For BP and diabetes  . LARIN FE 1.5/30 1.5-30 MG-MCG tablet Take 1 tablet by mouth daily. (Patient not taking: Reported on 04/11/2020)  . rosuvastatin (CRESTOR) 5 MG tablet Take 1 tablet (5 mg total) by mouth daily. For cholesterol (Patient not  taking: Reported on 04/11/2020)   No facility-administered encounter medications on file as of 04/11/2020.     Review of Systems  Constitutional: Positive for appetite change. Negative for chills and fever.  Respiratory: Negative for shortness of breath.   Cardiovascular: Negative for chest pain.  Gastrointestinal: Positive for abdominal pain, diarrhea and vomiting.     Vitals There were no vitals taken for this visit. 142/84-122-97% (per patient) took during video visit Objective:   Physical Exam  Unable. Appears uncomfortable with movement.  Assessment and Plan   1. Non-intractable vomiting with nausea, unspecified vomiting type - ondansetron (ZOFRAN ODT) 8 MG disintegrating tablet; Take 1 tablet (8 mg total) by mouth every 8 (eight) hours as needed for nausea or vomiting.  Dispense: 30 tablet; Refill: 0   Vomiting/diarrhea: Concerned for dehydration, reports husband is going to go get her some Gatorade, encouraged her to utilize ice to help avoid dehydration.  She will keep an eye on her heart rate, her oxygen saturation.  Reports she feels this is a "stomach flu "will send Zofran to assist with hydration.  Warning concerning dehydration, and seeking care in the emergency department for IV fluids.  Instructed her to increase her fluids, her urine needs to be lighter in color, and her heart rate needs to come down.  Recommend supportive therapy while you are recovering:   1) Get lots of rest.  2) Take over the counter pain medication if needed, such as acetaminophen or ibuprofen. Read and follow instructions on the label and make sure not to combine other medications that may have same ingredients in it. It is important to not take too much of these ingredients.  3) Drink plenty of caffeine-free fluids. (If you have heart or kidney problems, follow the instructions of your specialist regarding amounts).  4) If you are hungry, eat a bland diet, such as the BRAT diet (bananas,  rice, applesauce, toast).  5) Let us know if you are not feeling better in a week.   Agrees with plan of care discussed today. Understands warning signs to seek further care: Avoid dehydration, chest pain, shortness of breath, any significant change in health. Understands to follow-up if symptoms do not improve, or worsen.  Recommend Covid testing within 1 to 2 days if she is not feeling any better.  ED warning given concerning hazards of dehydration.  It is encouraging that she has tools available to assess her vital signs.  Dorena Bodo, FNP-C 04/11/2020

## 2020-04-12 ENCOUNTER — Other Ambulatory Visit: Payer: Self-pay | Admitting: Nurse Practitioner

## 2020-04-12 ENCOUNTER — Encounter: Payer: Self-pay | Admitting: Family Medicine

## 2020-04-15 ENCOUNTER — Encounter: Payer: Self-pay | Admitting: Family Medicine

## 2020-04-17 ENCOUNTER — Encounter: Payer: Self-pay | Admitting: Nurse Practitioner

## 2020-05-06 ENCOUNTER — Telehealth (INDEPENDENT_AMBULATORY_CARE_PROVIDER_SITE_OTHER): Payer: 59 | Admitting: Family Medicine

## 2020-05-06 ENCOUNTER — Encounter: Payer: Self-pay | Admitting: Family Medicine

## 2020-05-06 ENCOUNTER — Other Ambulatory Visit: Payer: Self-pay

## 2020-05-06 DIAGNOSIS — R059 Cough, unspecified: Secondary | ICD-10-CM

## 2020-05-06 DIAGNOSIS — J40 Bronchitis, not specified as acute or chronic: Secondary | ICD-10-CM | POA: Diagnosis not present

## 2020-05-06 MED ORDER — ALBUTEROL SULFATE HFA 108 (90 BASE) MCG/ACT IN AERS: 2.0000 | INHALATION_SPRAY | Freq: Four times a day (QID) | RESPIRATORY_TRACT | 0 refills | Status: DC | PRN

## 2020-05-06 MED ORDER — BECLOMETHASONE DIPROP HFA 80 MCG/ACT IN AERB: 2.0000 | INHALATION_SPRAY | Freq: Two times a day (BID) | RESPIRATORY_TRACT | 0 refills | Status: DC

## 2020-05-06 NOTE — Patient Instructions (Signed)
Recommend supportive therapy while you are recovering:   1) Get lots of rest.  2) Take over the counter pain medication if needed, such as acetaminophen or ibuprofen. Read and follow instructions on the label and make sure not to combine other medications that may have same ingredients in it. It is important to not take too much of these ingredients.  3) Drink plenty of caffeine-free fluids. (If you have heart or kidney problems, follow the instructions of your specialist regarding amounts).  4) If you are hungry, eat a bland diet, such as the BRAT diet (bananas, rice, applesauce, toast).  5) Let us know if you are not feeling better in a week.  Covid-19 warning:  Covid-19 is a virus that causes hypoxia (low oxygen level in blood) in some people. If you develop any changes in your usual breathing pattern: difficulty catching your breath, more short winded with activity or with resting, or anything that concerns you about your breathing, do not hesitate to go to the emergency department immediately for evaluation. Covid infection can also affect the way the brain functions if it lacks oxygen, such as, feeling dizzy, passing out, or feeling confused, if you experience any of these symptoms, please do not delay to seek treatment.  Some people experience gastrointestinal problems with Covid, such as vomiting and diarrhea, dehydration is a serious risk and should be avoided. If you are unable to keep liquids down you may need to go to the emergency department for intravenous fluids to avoid dehydration.   Please alert and involve your family and/or friends to help keep an eye on you while you recover from Covid-19. If you have any questions or concerns about your recovery, please do not hesitate to call the office for guidance.

## 2020-05-06 NOTE — Progress Notes (Signed)
Patient ID: Kimberly Becker, female    DOB: 1980/10/03, 40 y.o.   MRN: [id number]   Chief Complaint  Patient presents with  . Cough   Subjective:  CC: Dry hacking cough and fever.  This is a new problem.  Presents today via telephone visit with a complaint of cough and fever.  Associated symptoms include congestion.  Pertinent negatives include no fever, no chills, no chest pain, no shortness of breath, no wheezing.  Reports does not hear any rattling in her chest.  Has a pulse ox meter, reports that her oxygen saturations are 98%.  She has had a direct exposure to flu.  Her symptoms started 12 days ago.  Patient is a type I diabetic, on insulin, uses freestyle to monitor her blood sugar.  Has tried cough drops, NyQuil, DayQuil, with minimal relief.   Patient presents today with respiratory illness Number of days present- 12 days   Symptoms include- fever started 12 days ago and cough. Fever is gone. Still having cough and congestion - dark green. Taking cough drops, nyquil and dayquil  Presence of worrisome signs (severe shortness of breath, lethargy, etc.) - none   Recent/current visit to urgent care or ER- none  Recent direct exposure to Covid- none. States husband was sick with the flu the week before she got sick    Any current Covid testing- none  Virtual Visit via Telephone Note  I connected with Kimberly Becker on 05/06/20 at  2:30 PM EST by telephone and verified that I am speaking with the correct person using two identifiers.  Location: Patient: home Provider: office   I discussed the limitations, risks, security and privacy concerns of performing an evaluation and management service by telephone and the availability of in person appointments. I also discussed with the patient that there may be a patient responsible charge related to this service. The patient expressed understanding and agreed to proceed.   History of Present Illness:     Observations/Objective:   Assessment and Plan:   Follow Up Instructions:    I discussed the assessment and treatment plan with the patient. The patient was provided an opportunity to ask questions and all were answered. The patient agreed with the plan and demonstrated an understanding of the instructions.   The patient was advised to call back or seek an in-person evaluation if the symptoms worsen or if the condition fails to improve as anticipated.  I provided 14 minutes of non-face-to-face time during this encounter.      Medical History Kimberly Becker has a past medical history of Anxiety, History of chickenpox, Migraine headache, PCOS (polycystic ovarian syndrome), and Type I (juvenile type) diabetes mellitus without mention of complication, not stated as uncontrolled.   Outpatient Encounter Medications as of 05/06/2020  Medication Sig  . albuterol (VENTOLIN HFA) 108 (90 Base) MCG/ACT inhaler Inhale 2 puffs into the lungs every 6 (six) hours as needed for wheezing or shortness of breath.  Marland Kitchen aspirin EC 81 MG tablet Take 81 mg by mouth daily. Swallow whole.  . beclomethasone (QVAR) 80 MCG/ACT inhaler Inhale 2 puffs into the lungs 2 (two) times daily.  . Continuous Blood Gluc Sensor (FREESTYLE LIBRE 2 SENSOR) MISC Apply sensor to skin as directed for 14 days, then remove and replace.  . furosemide (LASIX) 20 MG tablet Take one pill each morning prn swelling  . insulin aspart (NOVOLOG) 100 UNIT/ML FlexPen INJECT 0-20 UNITS SUBCUTANEOUSLY 4 TIMES DAILY BEFORE MEALS AND AT BEDTIME AS PER  SLIDING SCALE,  . insulin glargine (LANTUS) 100 UNIT/ML injection Inject 0-0.2 mLs (0-20 Units total) into the skin at bedtime. Pt uses as needed per sliding scale.  BS less than 120:  No insulin  . Multiple Vitamin (MULTIVITAMIN WITH MINERALS) TABS tablet Take 1 tablet by mouth daily.  . ondansetron (ZOFRAN ODT) 8 MG disintegrating tablet Take 1 tablet (8 mg total) by mouth every 8 (eight) hours as needed  for nausea or vomiting.  Marland Kitchen OVER THE COUNTER MEDICATION Omega red  . pantoprazole (PROTONIX) 40 MG tablet TAKE 1 TABLET BY MOUTH ONCE DAILY AS NEEDED FOR  ACID  REFLUX  . potassium chloride (KLOR-CON) 10 MEQ tablet Take one tab each time you take Furosemide  . triamcinolone (KENALOG) 0.1 % Apply 1 application topically 2 (two) times daily. Prn rash; use up to 2 weeks  . valsartan (DIOVAN) 40 MG tablet Take 1 tablet (40 mg total) by mouth daily. For BP and diabetes  . LARIN FE 1.5/30 1.5-30 MG-MCG tablet Take 1 tablet by mouth daily. (Patient not taking: No sig reported)  . rosuvastatin (CRESTOR) 5 MG tablet Take 1 tablet (5 mg total) by mouth daily. For cholesterol (Patient not taking: No sig reported)   No facility-administered encounter medications on file as of 05/06/2020.     Review of Systems  Constitutional: Positive for fatigue. Negative for chills and fever.  HENT: Positive for congestion.   Respiratory: Positive for cough. Negative for chest tightness, shortness of breath and wheezing.   Cardiovascular: Negative for chest pain.  Gastrointestinal: Positive for abdominal pain. Negative for diarrhea, nausea and vomiting.  Musculoskeletal: Negative for myalgias.     Vitals There were no vitals taken for this visit. 98% per patient Objective:   Physical Exam  Dry cough, no obvious shortness of breath.  Assessment and Plan   1. Bronchitis - albuterol (VENTOLIN HFA) 108 (90 Base) MCG/ACT inhaler; Inhale 2 puffs into the lungs every 6 (six) hours as needed for wheezing or shortness of breath.  Dispense: 8 g; Refill: 0 - beclomethasone (QVAR) 80 MCG/ACT inhaler; Inhale 2 puffs into the lungs 2 (two) times daily.  Dispense: 1 each; Refill: 0  2. Cough - albuterol (VENTOLIN HFA) 108 (90 Base) MCG/ACT inhaler; Inhale 2 puffs into the lungs every 6 (six) hours as needed for wheezing or shortness of breath.  Dispense: 8 g; Refill: 0 - beclomethasone (QVAR) 80 MCG/ACT inhaler; Inhale 2  puffs into the lungs 2 (two) times daily.  Dispense: 1 each; Refill: 0   Due to dry hacking cough, present for 12 days, will treat for bronchitis.  Due to diabetes, not appropriate for oral steroid therapy.  Will treat with ICS inhaler, will also treat with rescue inhaler, albuterol (SABA).  She is instructed to continue to monitor her blood sugar closely while on inhaled corticosteroids.  She will discontinue inhaled corticosteroid and SABA once symptoms improved.  Recommend supportive therapy, adequate hydration, and over-the-counter medications to treat symptoms as needed.  Agrees with plan of care discussed today. Understands warning signs to seek further care: chest pain, shortness of breath, any significant change in health.  Understands to follow-up if symptoms do not improve, or worsen.  Monitor blood sugars closely.    Recommend supportive therapy while you are recovering:   1) Get lots of rest.  2) Take over the counter pain medication if needed, such as acetaminophen or ibuprofen. Read and follow instructions on the label and make sure not to combine other medications  that may have same ingredients in it. It is important to not take too much of these ingredients.  3) Drink plenty of caffeine-free fluids. (If you have heart or kidney problems, follow the instructions of your specialist regarding amounts).  4) If you are hungry, eat a bland diet, such as the BRAT diet (bananas, rice, applesauce, toast).  5) Let us know if you are not feeling better in a week.  Covid-19 warning:  Covid-19 is a virus that causes hypoxia (low oxygen level in blood) in some people. If you develop any changes in your usual breathing pattern: difficulty catching your breath, more short winded with activity or with resting, or anything that concerns you about your breathing, do not hesitate to go to the emergency department immediately for evaluation. Covid infection can also affect the way the brain  functions if it lacks oxygen, such as, feeling dizzy, passing out, or feeling confused, if you experience any of these symptoms, please do not delay to seek treatment.  Some people experience gastrointestinal problems with Covid, such as vomiting and diarrhea, dehydration is a serious risk and should be avoided. If you are unable to keep liquids down you may need to go to the emergency department for intravenous fluids to avoid dehydration.   Please alert and involve your family and/or friends to help keep an eye on you while you recover from Covid-19. If you have any questions or concerns about your recovery, please do not hesitate to call the office for guidance.   Agrees with plan of care discussed today. Understands warning signs to seek further care: chest pain, shortness of breath, any significant change in health.  Understands to follow-up if symptoms do not improve, or worsen.    Dorena Bodo, NP

## 2020-05-10 ENCOUNTER — Telehealth: Payer: Self-pay | Admitting: Family Medicine

## 2020-05-10 ENCOUNTER — Other Ambulatory Visit: Payer: Self-pay | Admitting: Family Medicine

## 2020-05-10 DIAGNOSIS — J40 Bronchitis, not specified as acute or chronic: Secondary | ICD-10-CM

## 2020-05-10 MED ORDER — BUDESONIDE-FORMOTEROL FUMARATE 80-4.5 MCG/ACT IN AERO: 2.0000 | INHALATION_SPRAY | Freq: Two times a day (BID) | RESPIRATORY_TRACT | 0 refills | Status: DC

## 2020-05-10 NOTE — Telephone Encounter (Signed)
Fax from [facility] stating that Qvar RediHaler 53mcg/act Aerosol is not approved. Fax in office to look over to see if there is another med on list that pt can use. Please advise. Thank you

## 2020-05-10 NOTE — Telephone Encounter (Signed)
sent 

## 2020-05-12 ENCOUNTER — Encounter: Payer: Self-pay | Admitting: Nurse Practitioner

## 2020-05-12 DIAGNOSIS — K5900 Constipation, unspecified: Secondary | ICD-10-CM

## 2020-05-14 NOTE — Telephone Encounter (Signed)
Nurses I believe it would be wise for this patient to be seen by gastroenterology for further evaluation of her constipation With this being new onset if it does not improve with the measures that we implemented they may decide to do further testing including the possibility of colonoscopy.  I would recommend referral to gastroenterology of her choice in [place] or with [facility] in [place]  Also please make sure that Kimberly Becker does the following  Takes 1 Colace every day Utilizes MiraLAX 1 capful in a glass of water daily (she may decrease this to a half a capful if this causes too many stools or too loose of stools)  May utilize Dulcolax tablets when she has not had a bowel movement for 3 consecutive days If necessary may utilize a fleets enema Drink several glasses of water per day Try to get some fiber into the diet with vegetables and fruit or whole grains  Thanks-Dr. Lorin Picket

## 2020-05-14 NOTE — Addendum Note (Signed)
Addended by: Marlowe Shores on: 05/14/2020 08:29 AM   Modules accepted: Orders

## 2020-05-29 ENCOUNTER — Encounter: Payer: Self-pay | Admitting: Family Medicine

## 2020-05-30 MED ORDER — TRIAMCINOLONE ACETONIDE 0.1 % EX CREA: TOPICAL_CREAM | CUTANEOUS | 0 refills | Status: DC

## 2020-05-30 NOTE — Addendum Note (Signed)
Addended by: Marlowe Shores on: 05/30/2020 09:18 AM   Modules accepted: Orders

## 2020-05-30 NOTE — Telephone Encounter (Signed)
Nurses I looked at the picture It does not appear to be yeast We can try a steroid cream If that does not help I would recommend follow-up office visit for dermatology consult May try triamcinolone 0.1% cream apply twice daily as needed for the next 2 weeks 15 g tube

## 2020-06-05 ENCOUNTER — Encounter: Payer: Self-pay | Admitting: Nurse Practitioner

## 2020-06-11 LAB — HEPATIC FUNCTION PANEL
ALT: 36 IU/L — ABNORMAL HIGH (ref 0–32)
AST: 30 IU/L (ref 0–40)
Albumin: 4.3 g/dL (ref 3.8–4.8)
Alkaline Phosphatase: 89 IU/L (ref 44–121)
Bilirubin Total: 0.5 mg/dL (ref 0.0–1.2)
Bilirubin, Direct: 0.14 mg/dL (ref 0.00–0.40)
Total Protein: 7.3 g/dL (ref 6.0–8.5)

## 2020-06-11 LAB — BASIC METABOLIC PANEL
BUN/Creatinine Ratio: 15 (ref 9–23)
BUN: 14 mg/dL (ref 6–24)
CO2: 26 mmol/L (ref 20–29)
Calcium: 9.8 mg/dL (ref 8.7–10.2)
Chloride: 97 mmol/L (ref 96–106)
Creatinine, Ser: 0.94 mg/dL (ref 0.57–1.00)
Glucose: 76 mg/dL (ref 65–99)
Potassium: 4.1 mmol/L (ref 3.5–5.2)
Sodium: 139 mmol/L (ref 134–144)
eGFR: 78 mL/min/{1.73_m2} (ref >59)

## 2020-06-15 ENCOUNTER — Other Ambulatory Visit: Payer: Self-pay | Admitting: Nurse Practitioner

## 2020-06-21 ENCOUNTER — Ambulatory Visit: Payer: 59 | Admitting: Family Medicine

## 2020-06-26 ENCOUNTER — Ambulatory Visit: Payer: No Typology Code available for payment source | Admitting: Gastroenterology

## 2020-07-01 ENCOUNTER — Other Ambulatory Visit (HOSPITAL_COMMUNITY): Payer: Self-pay | Admitting: Adult Health

## 2020-07-01 DIAGNOSIS — Z1231 Encounter for screening mammogram for malignant neoplasm of breast: Secondary | ICD-10-CM

## 2020-07-02 ENCOUNTER — Other Ambulatory Visit: Payer: Self-pay | Admitting: Nurse Practitioner

## 2020-07-05 ENCOUNTER — Encounter (HOSPITAL_COMMUNITY): Payer: Self-pay

## 2020-07-05 ENCOUNTER — Ambulatory Visit (HOSPITAL_COMMUNITY)
Admission: RE | Admit: 2020-07-05 | Discharge: 2020-07-05 | Disposition: A | Discharge status: Home / Self Care | Payer: 59 | Source: Ambulatory Visit | Attending: Adult Health | Admitting: Adult Health

## 2020-07-05 ENCOUNTER — Ambulatory Visit (INDEPENDENT_AMBULATORY_CARE_PROVIDER_SITE_OTHER): Payer: 59 | Admitting: Nurse Practitioner

## 2020-07-05 ENCOUNTER — Encounter: Payer: Self-pay | Admitting: Nurse Practitioner

## 2020-07-05 ENCOUNTER — Other Ambulatory Visit: Payer: Self-pay

## 2020-07-05 VITALS — BP 114/76 | Temp 97.3°F | Wt 249.6 lb

## 2020-07-05 DIAGNOSIS — Z1329 Encounter for screening for other suspected endocrine disorder: Secondary | ICD-10-CM | POA: Diagnosis not present

## 2020-07-05 DIAGNOSIS — E1069 Type 1 diabetes mellitus with other specified complication: Secondary | ICD-10-CM

## 2020-07-05 DIAGNOSIS — Z1231 Encounter for screening mammogram for malignant neoplasm of breast: Secondary | ICD-10-CM | POA: Diagnosis present

## 2020-07-05 DIAGNOSIS — E785 Hyperlipidemia, unspecified: Secondary | ICD-10-CM

## 2020-07-05 DIAGNOSIS — Z79899 Other long term (current) drug therapy: Secondary | ICD-10-CM | POA: Diagnosis not present

## 2020-07-05 NOTE — Progress Notes (Signed)
Subjective:    Patient ID: Kimberly Becker, female    DOB: 1981-01-13, 40 y.o.   MRN: [id number]  HPI  Pt here for 3 month follow up. Pt states she has been doing well. Blood pressure has been good since starting valsartan and lasix. Sugars have been good. FreeStyle has helped a lot. Had to have FreeStyle removed for mammogram today.   Wants to try other weight loss medication, has tried phentermine in the past. States she has been eating better, no sugary drinks, no red meat, no pork, decreases salt intake. Uses salt substitute. Eats more ground [place].   Denies any foot pain, tingling or numbness.   Regular physical activity includes routine with children to games and appointments.   Up to date with eye exam, and dental   Review of Systems  Constitutional: Positive for diaphoresis. Negative for activity change, appetite change, chills, fatigue, fever and unexpected weight change.       Night sweats only when sugar drops below 60  Respiratory: Negative for cough, chest tightness and shortness of breath.   Cardiovascular: Negative for chest pain, palpitations and leg swelling.  Gastrointestinal: Negative for abdominal distention, constipation, diarrhea, nausea and vomiting.  Endocrine: Negative for polydipsia, polyphagia and polyuria.  The CBG monitor helps with hypoglycemic episodes at night with an alarm.      Objective:   Physical Exam Constitutional:      General: She is not in acute distress.    Appearance: Normal appearance. She is not ill-appearing.  Cardiovascular:     Rate and Rhythm: Normal rate and regular rhythm.     Heart sounds: Normal heart sounds. No murmur heard.   Pulmonary:     Effort: Pulmonary effort is normal. No respiratory distress.     Breath sounds: Normal breath sounds.  Neurological:     Mental Status: She is alert and oriented to person, place, and time.  Psychiatric:        Mood and Affect: Mood normal.        Behavior: Behavior normal.         Thought Content: Thought content normal.        Judgment: Judgment normal.     Today's Vitals   07/05/20 1347  BP: 114/76  Temp: (!) 97.3 F (36.3 C)  Weight: 249 lb 9.6 oz (113.2 kg)   Body mass index is 44.21 kg/m.     Assessment & Plan:   Problem List Items Addressed This Visit      Endocrine   Type 1 diabetes mellitus (HCC) - Primary   Relevant Orders   CBC with Differential   Comprehensive Metabolic Panel (CMET)   Hemoglobin A1c   TSH   Lipid Profile     Other   Hyperlipidemia   Relevant Orders   CBC with Differential   Comprehensive Metabolic Panel (CMET)   Hemoglobin A1c   TSH   Lipid Profile    Other Visit Diagnoses    High risk medication use       Relevant Orders   CBC with Differential   Comprehensive Metabolic Panel (CMET)   Hemoglobin A1c   TSH   Lipid Profile   Thyroid disorder screening       Relevant Orders   CBC with Differential   Comprehensive Metabolic Panel (CMET)   Hemoglobin A1c   TSH   Lipid Profile      Plan/Education   Reviewed lab work with patient   Advised pt to continue monitoring  glucose with free style libre.   Continue eating healthy, reducing sodium intake, and engaging in physical activities to help with weight loss.   Continue monitoring BP and edema.    Return in about 3 months (around 10/05/2020). Repeat labs at that time.

## 2020-07-05 NOTE — Progress Notes (Signed)
   Subjective:    Patient ID: Kimberly Becker, female    DOB: 09-22-1980, 40 y.o.   MRN: [id number]  HPI Pt here for 3 month follow up. Pt states she has been doing well. Blood pressure has been good. Sugars have been good. FreeStyle has helped a lot. Had to have FreeStyle removed for mammogram today.  Any other meds for weight loss. Pt has been eating better, no sugary drinks, exercises.    Review of Systems     Objective:   Physical Exam        Assessment & Plan:

## 2020-07-06 ENCOUNTER — Encounter: Payer: Self-pay | Admitting: Nurse Practitioner

## 2020-07-08 ENCOUNTER — Other Ambulatory Visit: Payer: Self-pay | Admitting: Family Medicine

## 2020-07-14 ENCOUNTER — Other Ambulatory Visit: Payer: Self-pay | Admitting: Nurse Practitioner

## 2020-08-04 ENCOUNTER — Other Ambulatory Visit: Payer: Self-pay | Admitting: Family Medicine

## 2020-08-04 ENCOUNTER — Encounter: Payer: Self-pay | Admitting: Nurse Practitioner

## 2020-09-02 ENCOUNTER — Other Ambulatory Visit: Payer: Self-pay | Admitting: Nurse Practitioner

## 2020-09-05 ENCOUNTER — Encounter: Payer: Self-pay | Admitting: Nurse Practitioner

## 2020-09-09 ENCOUNTER — Other Ambulatory Visit: Payer: Self-pay | Admitting: Nurse Practitioner

## 2020-09-13 ENCOUNTER — Encounter: Payer: Self-pay | Admitting: Nurse Practitioner

## 2020-09-19 ENCOUNTER — Other Ambulatory Visit: Payer: Self-pay

## 2020-09-19 ENCOUNTER — Ambulatory Visit (INDEPENDENT_AMBULATORY_CARE_PROVIDER_SITE_OTHER): Payer: 59 | Admitting: Nurse Practitioner

## 2020-09-19 ENCOUNTER — Encounter: Payer: Self-pay | Admitting: Nurse Practitioner

## 2020-09-19 VITALS — BP 110/68 | HR 105 | Temp 97.1°F | Ht 63.0 in | Wt 245.0 lb

## 2020-09-19 DIAGNOSIS — K219 Gastro-esophageal reflux disease without esophagitis: Secondary | ICD-10-CM | POA: Insufficient documentation

## 2020-09-19 DIAGNOSIS — E1069 Type 1 diabetes mellitus with other specified complication: Secondary | ICD-10-CM | POA: Diagnosis not present

## 2020-09-19 DIAGNOSIS — I152 Hypertension secondary to endocrine disorders: Secondary | ICD-10-CM | POA: Diagnosis not present

## 2020-09-19 DIAGNOSIS — E1159 Type 2 diabetes mellitus with other circulatory complications: Secondary | ICD-10-CM

## 2020-09-19 MED ORDER — INSULIN ASPART 100 UNIT/ML FLEXPEN: PEN_INJECTOR | SUBCUTANEOUS | 5 refills | Status: DC

## 2020-09-19 MED ORDER — INSULIN GLARGINE 100 UNIT/ML ~~LOC~~ SOLN: 0.0000 [IU] | Freq: Every day | SUBCUTANEOUS | 1 refills | Status: DC

## 2020-09-19 MED ORDER — POTASSIUM CHLORIDE ER 10 MEQ PO TBCR: EXTENDED_RELEASE_TABLET | ORAL | 2 refills | Status: DC

## 2020-09-19 NOTE — Progress Notes (Signed)
   Subjective:    Patient ID: Kimberly Becker, female    DOB: 1980-05-24, 40 y.o.   MRN: [id number]  HPI presents for recheck on her blood pressure.  BP has been running well outside the office.  Blood sugars are doing much better averaging 120-164.  Blood sugar this morning was 124.  Using continuous blood glucose monitor which is greatly helped her control.  Very active.  Doing much better with her diet.  Gets regular preventive health physicals with her gynecologist.  Had her labs drawn this morning.  Needs a dental exam.  Eye exam is up-to-date. Denies chest pain/ischemic type pain shortness of breath or edema. GERD controlled with daily use of pantoprazole.      Objective:   Physical Exam NAD.  Alert, oriented.  Lungs clear.  Heart regular rate rhythm.  Abdomen soft nondistended nontender. Today's Vitals   09/19/20 1109  BP: 110/68  Pulse: (!) 105  Temp: (!) 97.1 F (36.2 C)  SpO2: 99%  Weight: 245 lb (111.1 kg)  Height: 5\' 3"  (1.6 m)   Body mass index is 43.4 kg/m.       Assessment & Plan:   Problem List Items Addressed This Visit       Cardiovascular and Mediastinum   Hypertension associated with diabetes (HCC) - Primary   Relevant Medications   insulin aspart (NOVOLOG) 100 UNIT/ML FlexPen   insulin glargine (LANTUS) 100 UNIT/ML injection     Digestive   Gastroesophageal reflux disease without esophagitis     Endocrine   Type 1 diabetes mellitus (HCC)   Relevant Medications   insulin aspart (NOVOLOG) 100 UNIT/ML FlexPen   insulin glargine (LANTUS) 100 UNIT/ML injection   Meds ordered this encounter  Medications   insulin aspart (NOVOLOG) 100 UNIT/ML FlexPen    Sig: INJECT 0-20 UNITS SUBCUTANEOUSLY 4 TIMES DAILY BEFORE MEALS AND AT BEDTIME AS PER SLIDING SCALE,    Dispense:  15 mL    Refill:  5    Change in therapy due to insurance starting 8/1   insulin glargine (LANTUS) 100 UNIT/ML injection    Sig: Inject 0-0.2 mLs (0-20 Units total) into the skin at  bedtime. Pt uses as needed per sliding scale.  BS less than 120:  No insulin    Dispense:  30 mL    Refill:  1   potassium chloride (KLOR-CON) 10 MEQ tablet    Sig: TAKE 1 TABLET BY MOUTH ONCE DAILY WITH  FUROSEMIDE    Dispense:  30 tablet    Refill:  2   Continue current medications as directed.  Encouraged continued weight loss efforts healthy diet and activity.  Follow-up in 3 months for recheck on her diabetes to include her foot exam at that time.  It was noted after the visit the patient was not taking her rosuvastatin, will contact her regarding this. Labs pending.

## 2020-09-20 ENCOUNTER — Encounter: Payer: Self-pay | Admitting: Nurse Practitioner

## 2020-09-20 LAB — CBC WITH DIFFERENTIAL/PLATELET
Basophils Absolute: 0.1 10*3/uL (ref 0.0–0.2)
Basos: 1 %
EOS (ABSOLUTE): 0.2 10*3/uL (ref 0.0–0.4)
Eos: 2 %
Hematocrit: 42.5 % (ref 34.0–46.6)
Hemoglobin: 13.8 g/dL (ref 11.1–15.9)
Immature Grans (Abs): 0 10*3/uL (ref 0.0–0.1)
Immature Granulocytes: 0 %
Lymphocytes Absolute: 2.5 10*3/uL (ref 0.7–3.1)
Lymphs: 31 %
MCH: 29.2 pg (ref 26.6–33.0)
MCHC: 32.5 g/dL (ref 31.5–35.7)
MCV: 90 fL (ref 79–97)
Monocytes Absolute: 0.8 10*3/uL (ref 0.1–0.9)
Monocytes: 10 %
Neutrophils Absolute: 4.4 10*3/uL (ref 1.4–7.0)
Neutrophils: 56 %
Platelets: 349 10*3/uL (ref 150–450)
RBC: 4.73 x10E6/uL (ref 3.77–5.28)
RDW: 12.2 % (ref 11.7–15.4)
WBC: 7.8 10*3/uL (ref 3.4–10.8)

## 2020-09-20 LAB — LIPID PANEL
Chol/HDL Ratio: 4.4 ratio (ref 0.0–4.4)
Cholesterol, Total: 240 mg/dL — ABNORMAL HIGH (ref 100–199)
HDL: 54 mg/dL (ref >39)
LDL Chol Calc (NIH): 149 mg/dL — ABNORMAL HIGH (ref 0–99)
Triglycerides: 202 mg/dL — ABNORMAL HIGH (ref 0–149)
VLDL Cholesterol Cal: 37 mg/dL (ref 5–40)

## 2020-09-20 LAB — COMPREHENSIVE METABOLIC PANEL
ALT: 34 IU/L — ABNORMAL HIGH (ref 0–32)
AST: 21 IU/L (ref 0–40)
Albumin/Globulin Ratio: 1.4 (ref 1.2–2.2)
Albumin: 4.3 g/dL (ref 3.8–4.8)
Alkaline Phosphatase: 70 IU/L (ref 44–121)
BUN/Creatinine Ratio: 16 (ref 9–23)
BUN: 15 mg/dL (ref 6–24)
Bilirubin Total: 0.4 mg/dL (ref 0.0–1.2)
CO2: 23 mmol/L (ref 20–29)
Calcium: 9.6 mg/dL (ref 8.7–10.2)
Chloride: 99 mmol/L (ref 96–106)
Creatinine, Ser: 0.93 mg/dL (ref 0.57–1.00)
Globulin, Total: 3.1 g/dL (ref 1.5–4.5)
Glucose: 167 mg/dL — ABNORMAL HIGH (ref 65–99)
Potassium: 4.2 mmol/L (ref 3.5–5.2)
Sodium: 139 mmol/L (ref 134–144)
Total Protein: 7.4 g/dL (ref 6.0–8.5)
eGFR: 79 mL/min/{1.73_m2} (ref >59)

## 2020-09-20 LAB — HEMOGLOBIN A1C
Est. average glucose Bld gHb Est-mCnc: 160 mg/dL
Hgb A1c MFr Bld: 7.2 % — ABNORMAL HIGH (ref 4.8–5.6)

## 2020-09-20 LAB — TSH: TSH: 1.28 u[IU]/mL (ref 0.450–4.500)

## 2020-09-24 NOTE — Telephone Encounter (Signed)
Nurses  I reviewed over the lab work.  I do not feel she needs to repeat it.  The A1c is not affected by eating on the day of the test.  It would be wise to know how much long-acting insulin she is using currently and what typically her morning numbers look like.  If the patient can compile a paper or chart that indicates reading numbers and how much insulin she is using that would be helpful to help guide what needs to be done for her  Thanks-Dr. Lorin Picket

## 2020-10-04 ENCOUNTER — Ambulatory Visit: Payer: 59 | Admitting: Nurse Practitioner

## 2020-11-04 ENCOUNTER — Other Ambulatory Visit: Payer: Self-pay | Admitting: Family Medicine

## 2020-11-04 DIAGNOSIS — E785 Hyperlipidemia, unspecified: Secondary | ICD-10-CM

## 2020-11-24 ENCOUNTER — Other Ambulatory Visit: Payer: Self-pay | Admitting: Nurse Practitioner

## 2020-11-27 ENCOUNTER — Encounter: Payer: Self-pay | Admitting: Nurse Practitioner

## 2020-11-30 ENCOUNTER — Other Ambulatory Visit: Payer: Self-pay | Admitting: Nurse Practitioner

## 2020-11-30 DIAGNOSIS — E785 Hyperlipidemia, unspecified: Secondary | ICD-10-CM

## 2020-11-30 DIAGNOSIS — Z79899 Other long term (current) drug therapy: Secondary | ICD-10-CM

## 2020-12-02 ENCOUNTER — Other Ambulatory Visit: Payer: Self-pay | Admitting: Nurse Practitioner

## 2020-12-04 ENCOUNTER — Encounter: Payer: Self-pay | Admitting: Nurse Practitioner

## 2020-12-04 MED ORDER — FREESTYLE LIBRE 2 SENSOR MISC: 6 refills | Status: DC

## 2020-12-04 NOTE — Addendum Note (Signed)
Addended by: Marlowe Shores on: 12/04/2020 01:19 PM   Modules accepted: Orders

## 2020-12-04 NOTE — Telephone Encounter (Signed)
Kimberly Becker she may have refills for 6 months please assist with this if you need further input from me please ask

## 2020-12-20 ENCOUNTER — Ambulatory Visit (INDEPENDENT_AMBULATORY_CARE_PROVIDER_SITE_OTHER): Payer: 59 | Admitting: Nurse Practitioner

## 2020-12-20 ENCOUNTER — Other Ambulatory Visit: Payer: Self-pay

## 2020-12-20 VITALS — BP 109/59 | Ht 63.0 in | Wt 241.4 lb

## 2020-12-20 DIAGNOSIS — E1069 Type 1 diabetes mellitus with other specified complication: Secondary | ICD-10-CM

## 2020-12-20 DIAGNOSIS — I152 Hypertension secondary to endocrine disorders: Secondary | ICD-10-CM

## 2020-12-20 DIAGNOSIS — E785 Hyperlipidemia, unspecified: Secondary | ICD-10-CM | POA: Diagnosis not present

## 2020-12-20 DIAGNOSIS — E1159 Type 2 diabetes mellitus with other circulatory complications: Secondary | ICD-10-CM

## 2020-12-20 DIAGNOSIS — R609 Edema, unspecified: Secondary | ICD-10-CM

## 2020-12-20 DIAGNOSIS — E282 Polycystic ovarian syndrome: Secondary | ICD-10-CM | POA: Diagnosis not present

## 2020-12-20 DIAGNOSIS — L680 Hirsutism: Secondary | ICD-10-CM

## 2020-12-20 DIAGNOSIS — Z79899 Other long term (current) drug therapy: Secondary | ICD-10-CM

## 2020-12-20 DIAGNOSIS — K219 Gastro-esophageal reflux disease without esophagitis: Secondary | ICD-10-CM

## 2020-12-20 NOTE — Progress Notes (Signed)
Subjective:    Patient ID: Kimberly Becker, female    DOB: 02/18/1981, 40 y.o.   MRN: [id number]  Hypertension This is a chronic problem. The current episode started more than 1 year ago. Risk factors for coronary artery disease include dyslipidemia, diabetes mellitus and obesity. Treatments tried: valsartan, lasix. There are no compliance problems.   Presents for recheck on her chronic health issues.  BP at home 112/75.  Currently on oral contraceptives, describes her cycle is regular to light flow.  Gets regular gynecological exams.  Adherent to medication regimen.  Blood sugar at home this morning was 175.  Main complaints today are increased fatigue over the past month.  Has also noticed excessive hair growth to the point that she has to shave.  Decreased libido.  Denies any change in diet, activity or medications.  Takes pantoprazole daily which is controlling her reflux symptoms.  Itchy watery eyes at times, denies any other allergy symptoms.  Has an eye exam scheduled for December.  Has decreased simple carbs in her diet and has cut out all regular soda.  Denies any pain, numbness or tingling in the feet.  PMH includes PCOS. Denies chest pain/ischemic type pain shortness of breath.  Edema well controlled with use of Lasix. Denies lightheadedness, dizziness or syncope.    Objective:   Physical Exam NAD.  Alert, oriented.  Cheerful affect.  Conjunctiva clear bilaterally.  Thyroid nontender to palpation, no mass or goiter noted.  Lungs clear.  Heart regular rate rhythm.  Carotids no bruits or thrills.  Abdomen obese soft nondistended nontender.  Lower extremities trace pitting edema.  Diabetic Foot Exam - Simple   Simple Foot Form Diabetic Foot exam was performed with the following findings: Yes 12/20/2020 10:10 AM  Visual Inspection No deformities, no ulcerations, no other skin breakdown bilaterally: Yes Sensation Testing Intact to touch and monofilament testing bilaterally: Yes Pulse  Check See comments: Yes Comments DP pulses strong and equal bilat; toes warm    Today's Vitals   12/20/20 0916  BP: (!) 109/59  Weight: 241 lb 6.4 oz (109.5 kg)  Height: 5\' 3"  (1.6 m)   Body mass index is 42.76 kg/m.      Assessment & Plan:   Problem List Items Addressed This Visit       Cardiovascular and Mediastinum   Hypertension associated with diabetes (HCC)     Digestive   Gastroesophageal reflux disease without esophagitis     Endocrine   PCO (polycystic ovaries)   Relevant Orders   Testosterone (Completed)   Type 1 diabetes mellitus (HCC) - Primary   Relevant Orders   Hemoglobin A1c (Completed)   Microalbumin / creatinine urine ratio (Completed)     Musculoskeletal and Integument   Hirsutism   Relevant Orders   Testosterone (Completed)     Other   Hyperlipidemia   Relevant Orders   Lipid panel (Completed)   Peripheral edema   Other Visit Diagnoses     High risk medication use       Relevant Orders   CBC with Differential/Platelet (Completed)   Hepatic function panel (Completed)      Cautioned patient about possible dizziness or lightheadedness with her blood pressure.  Continue valsartan as directed since she is asymptomatic at this point. Continue pantoprazole as directed for reflux. Continue current doses of insulin, continue to monitor blood sugar. Labs ordered for today.  Further follow-up based on results. Discussed recommended vaccines, patient plans to get her flu vaccine this  fall through work. Return in about 3 months (around 03/21/2021) for diabetes check up.

## 2020-12-20 NOTE — Patient Instructions (Signed)
Zaditor eye drops (generic) 

## 2020-12-21 ENCOUNTER — Encounter: Payer: Self-pay | Admitting: Nurse Practitioner

## 2020-12-21 DIAGNOSIS — R609 Edema, unspecified: Secondary | ICD-10-CM | POA: Insufficient documentation

## 2020-12-21 DIAGNOSIS — L680 Hirsutism: Secondary | ICD-10-CM | POA: Insufficient documentation

## 2020-12-21 DIAGNOSIS — R600 Localized edema: Secondary | ICD-10-CM | POA: Insufficient documentation

## 2020-12-23 ENCOUNTER — Encounter: Payer: Self-pay | Admitting: Nurse Practitioner

## 2020-12-24 LAB — LIPID PANEL
Chol/HDL Ratio: 3.3 ratio (ref 0.0–4.4)
Cholesterol, Total: 180 mg/dL (ref 100–199)
HDL: 55 mg/dL (ref >39)
LDL Chol Calc (NIH): 94 mg/dL (ref 0–99)
Triglycerides: 180 mg/dL — ABNORMAL HIGH (ref 0–149)
VLDL Cholesterol Cal: 31 mg/dL (ref 5–40)

## 2020-12-24 LAB — HEMOGLOBIN A1C
Est. average glucose Bld gHb Est-mCnc: 148 mg/dL
Hgb A1c MFr Bld: 6.8 % — ABNORMAL HIGH (ref 4.8–5.6)

## 2020-12-24 LAB — CBC WITH DIFFERENTIAL/PLATELET
Basophils Absolute: 0.1 10*3/uL (ref 0.0–0.2)
Basos: 1 %
EOS (ABSOLUTE): 0.1 10*3/uL (ref 0.0–0.4)
Eos: 1 %
Hematocrit: 40.1 % (ref 34.0–46.6)
Hemoglobin: 13.4 g/dL (ref 11.1–15.9)
Immature Grans (Abs): 0 10*3/uL (ref 0.0–0.1)
Immature Granulocytes: 0 %
Lymphocytes Absolute: 2.6 10*3/uL (ref 0.7–3.1)
Lymphs: 22 %
MCH: 28.6 pg (ref 26.6–33.0)
MCHC: 33.4 g/dL (ref 31.5–35.7)
MCV: 86 fL (ref 79–97)
Monocytes Absolute: 0.9 10*3/uL (ref 0.1–0.9)
Monocytes: 8 %
Neutrophils Absolute: 7.8 10*3/uL — ABNORMAL HIGH (ref 1.4–7.0)
Neutrophils: 68 %
Platelets: 361 10*3/uL (ref 150–450)
RBC: 4.68 x10E6/uL (ref 3.77–5.28)
RDW: 12.4 % (ref 11.7–15.4)
WBC: 11.5 10*3/uL — ABNORMAL HIGH (ref 3.4–10.8)

## 2020-12-24 LAB — HEPATIC FUNCTION PANEL
ALT: 28 IU/L (ref 0–32)
AST: 27 IU/L (ref 0–40)
Albumin: 4.8 g/dL (ref 3.8–4.8)
Alkaline Phosphatase: 87 IU/L (ref 44–121)
Bilirubin Total: 0.5 mg/dL (ref 0.0–1.2)
Bilirubin, Direct: 0.17 mg/dL (ref 0.00–0.40)
Total Protein: 7.4 g/dL (ref 6.0–8.5)

## 2020-12-24 LAB — TESTOSTERONE: Testosterone: 36 ng/dL (ref 4–50)

## 2020-12-24 LAB — MICROALBUMIN / CREATININE URINE RATIO
Creatinine, Urine: 22.6 mg/dL
Microalb/Creat Ratio: <13 mg/g creat (ref 0–29)
Microalbumin, Urine: <3 ug/mL

## 2020-12-26 ENCOUNTER — Telehealth: Payer: 59 | Admitting: Physician Assistant

## 2020-12-26 DIAGNOSIS — J069 Acute upper respiratory infection, unspecified: Secondary | ICD-10-CM

## 2020-12-26 MED ORDER — FLUTICASONE PROPIONATE 50 MCG/ACT NA SUSP: 2.0000 | Freq: Every day | NASAL | 0 refills | Status: DC

## 2020-12-26 MED ORDER — BENZONATATE 100 MG PO CAPS
100.0000 mg | ORAL_CAPSULE | Freq: Three times a day (TID) | ORAL | 0 refills | Status: DC | PRN
Start: 2020-12-26 — End: 2021-04-25

## 2020-12-26 NOTE — Progress Notes (Signed)

## 2020-12-26 NOTE — Progress Notes (Signed)
I have spent 5 minutes in review of e-visit questionnaire, review and updating patient chart, medical decision making and response to patient.   Narelle Schoening Cody Cheryllynn Sarff, PA-C    

## 2020-12-27 ENCOUNTER — Encounter: Payer: Self-pay | Admitting: Nurse Practitioner

## 2020-12-27 ENCOUNTER — Other Ambulatory Visit: Payer: Self-pay | Admitting: Nurse Practitioner

## 2020-12-27 DIAGNOSIS — Z79899 Other long term (current) drug therapy: Secondary | ICD-10-CM

## 2020-12-27 MED ORDER — ROSUVASTATIN CALCIUM 10 MG PO TABS
10.0000 mg | ORAL_TABLET | Freq: Every day | ORAL | 0 refills | Status: DC
Start: 2020-12-27 — End: 2021-03-24

## 2020-12-27 MED ORDER — FUROSEMIDE 20 MG PO TABS: ORAL_TABLET | ORAL | 0 refills | Status: DC

## 2020-12-27 MED ORDER — VALSARTAN 40 MG PO TABS: ORAL_TABLET | ORAL | 0 refills | Status: DC

## 2020-12-27 MED ORDER — SPIRONOLACTONE 50 MG PO TABS: 50.0000 mg | ORAL_TABLET | Freq: Every day | ORAL | 0 refills | Status: DC

## 2020-12-27 MED ORDER — PANTOPRAZOLE SODIUM 40 MG PO TBEC: DELAYED_RELEASE_TABLET | ORAL | 0 refills | Status: DC

## 2020-12-27 NOTE — Progress Notes (Signed)
Phone call to patient: Stop potassium 10 meq because of starting Aldactone. Continue Lasix daily for leg swelling.  Continue low dose Valsartan.  Repeat lab (sodium and potassium) in one month.  Patient verbalizes understanding.

## 2021-01-01 ENCOUNTER — Telehealth: Payer: Self-pay | Admitting: Family Medicine

## 2021-01-23 ENCOUNTER — Ambulatory Visit (INDEPENDENT_AMBULATORY_CARE_PROVIDER_SITE_OTHER): Payer: 59 | Admitting: Family Medicine

## 2021-01-23 ENCOUNTER — Other Ambulatory Visit: Payer: Self-pay

## 2021-01-23 VITALS — BP 105/54 | HR 63 | Ht 63.0 in | Wt 241.6 lb

## 2021-01-23 DIAGNOSIS — E1159 Type 2 diabetes mellitus with other circulatory complications: Secondary | ICD-10-CM

## 2021-01-23 DIAGNOSIS — I152 Hypertension secondary to endocrine disorders: Secondary | ICD-10-CM

## 2021-01-23 DIAGNOSIS — E1069 Type 1 diabetes mellitus with other specified complication: Secondary | ICD-10-CM

## 2021-01-23 DIAGNOSIS — E785 Hyperlipidemia, unspecified: Secondary | ICD-10-CM

## 2021-01-23 DIAGNOSIS — G5600 Carpal tunnel syndrome, unspecified upper limb: Secondary | ICD-10-CM

## 2021-01-23 DIAGNOSIS — Z23 Encounter for immunization: Secondary | ICD-10-CM

## 2021-01-23 NOTE — Assessment & Plan Note (Signed)
At goal. Most recent A1C 6.8. Continuing her current dosing of Lantus and Novolog at this time.

## 2021-01-23 NOTE — Assessment & Plan Note (Signed)
At goal. Well controlled. Will continue Valsartan, Spironolactone, Lasix.

## 2021-01-23 NOTE — Progress Notes (Signed)
Subjective:  Patient ID: Kimberly Becker, female    DOB: November 22, 1980  Age: 40 y.o. MRN: [id number]  CC: Chief Complaint  Patient presents with   Diabetes    Follow up - Establish Care  No problems or concerns- going to have repeat Met 7 to recheck potassium when she leaves today    HPI:  40 year old female presents for follow up (and to meet me).  DM Blood sugars readings - Uses Freestyle Libre; Sugars are at goal.  Hypoglycemia - No. Medications - Lantus 10 u; Novolog (~3-5 units with meals). Adverse effects - No. Compliance - Yes. Preventative care Preventative care is up to date excluding booster COVID vaccine and flu shot. Wants flu shot today.  Hypertension Well controlled. Currently taking Lasix, spironolactone, and Valsartan.  No side effects. Has BMP today to assess potassium given recent start of spironolactone.  Hyperlipidemia Doing well on Crestor. Recent lipid panel reviewed.  Carpal tunnel syndrome Patient states that this has been bothering her recently.  She attributes this to the weather change.  She is wearing her splints at night.  She states that it has been difficult for her recently and interfering with her activities.    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[place]) 02/14/2015   Type 1 diabetes mellitus ([place]) 09/26/2014   Anxiety 06/26/2013   Hyperlipidemia 03/20/2013   Morbid obesity ([place]) 11/08/2012   Migraines 08/10/2012   PCO (polycystic ovaries) 11/12/2010    Social Hx   Social History   Socioeconomic History   Marital status: Married    Spouse name: Not on file   Number of children: Not on file   Years of education: 14   Highest education  level: Not on file  Occupational History   Occupation: Print production planner  Tobacco Use   Smoking status: Never   Smokeless tobacco: Never  Vaping Use   Vaping Use: Never used  Substance and Sexual Activity   Alcohol use: No   Drug use: No   Sexual activity: Yes    Birth control/protection: None    Comment: heterosexual relationship  Other Topics Concern   Not on file  Social History Narrative   Not on file   Social Determinants of Health   Financial Resource Strain: Not on file  Food Insecurity: Not on file  Transportation Needs: Not on file  Physical Activity: Not on file  Stress: Not on file  Social Connections: Not on file    Review of Systems  Constitutional: Negative.   Endocrine:       No hypoglycemia.  Musculoskeletal:        Wrist pain - Carpal tunnel syndrome.    Objective:  BP (!) 105/54   Pulse 63   Ht _0  (1.6 m)   Wt 241 lb 9.6 oz (109.6 kg)   SpO2 100%   BMI 42.80 kg/m   BP/Weight 01/23/2021 12/14/8180 12/21/3714  Systolic BP 967 893 810  Diastolic BP 54 59 68  Wt. (Lbs) 241.6 241.4 245  BMI 42.8 42.76 43.4    Physical Exam Vitals and nursing note reviewed.  Constitutional:      General: She is not in acute distress.    Appearance: Normal appearance. She is obese.  HENT:     Head: Normocephalic and atraumatic.  Cardiovascular:     Rate and Rhythm: Normal rate and regular rhythm.     Heart sounds: No murmur heard. Pulmonary:     Effort: Pulmonary effort is normal.     Breath sounds: Normal breath sounds. No wheezing, rhonchi or rales.  Neurological:     Mental Status: She is alert.  Psychiatric:        Mood and Affect: Mood normal.        Behavior: Behavior normal.    Lab Results  Component Value Date   WBC 11.5 (H) 12/20/2020   HGB 13.4 12/20/2020   HCT 40.1 12/20/2020   PLT 361 12/20/2020   GLUCOSE 167 (H) 09/19/2020   CHOL 180 12/20/2020   TRIG 180 (H) 12/20/2020   HDL 55 12/20/2020   LDLCALC 94 12/20/2020   ALT 28  12/20/2020   AST 27 12/20/2020   NA 139 09/19/2020   K 4.2 09/19/2020   CL 99 09/19/2020   CREATININE 0.93 09/19/2020   BUN 15 09/19/2020   CO2 23 09/19/2020   TSH 1.280 09/19/2020   HGBA1C 6.8 (H) 12/20/2020   MICROALBUR 0.2 05/18/2014     Assessment & Plan:   Problem List Items Addressed This Visit       Cardiovascular and Mediastinum   Hypertension associated with diabetes ([place])    At goal. Well controlled. Will continue Valsartan, Spironolactone, Lasix.        Endocrine   Type 1 diabetes mellitus ([place])    At goal. Most recent A1C 6.8. Continuing her current dosing of Lantus and Novolog at this time.         Nervous and Auditory   Carpal tunnel syndrome    Has worsened recently. Wearing splints at night. If continues to be bothersome will let us know.        Other   Hyperlipidemia    LDL at goal. Low ASCVD 10 year risk at this time (0.4%). Continue Crestor.      Other Visit Diagnoses     Need for vaccination    -  Primary   Relevant Orders   Flu Vaccine QUAD 55moIM (Fluarix, Fluzone & Alfiuria Quad PF) (Completed)       No orders of the defined types were placed in this encounter.   Follow-up:  Return in about 3 months (around 04/25/2021), or CHoyle Sauer  [place]

## 2021-01-23 NOTE — Assessment & Plan Note (Signed)
Has worsened recently. Wearing splints at night. If continues to be bothersome will let us know.

## 2021-01-23 NOTE — Assessment & Plan Note (Signed)
LDL at goal. Low ASCVD 10 year risk at this time (0.4%). Continue Crestor.

## 2021-01-23 NOTE — Telephone Encounter (Signed)
Error Please close

## 2021-01-23 NOTE — Patient Instructions (Signed)
Continue your current meds.  Follow up with Eber Jones as your normally do.  I am here if you need anything.  You're doing great. Everything seems to be well controlled at this time.  Take care  Dr. Adriana Simas

## 2021-02-18 ENCOUNTER — Other Ambulatory Visit: Payer: Self-pay | Admitting: Nurse Practitioner

## 2021-02-18 ENCOUNTER — Encounter: Payer: Self-pay | Admitting: Nurse Practitioner

## 2021-02-18 MED ORDER — FUROSEMIDE 20 MG PO TABS: ORAL_TABLET | ORAL | 0 refills | Status: DC

## 2021-02-18 MED ORDER — PANTOPRAZOLE SODIUM 40 MG PO TBEC
DELAYED_RELEASE_TABLET | ORAL | 0 refills | Status: DC
Start: 2021-02-18 — End: 2021-08-01

## 2021-02-25 ENCOUNTER — Other Ambulatory Visit: Payer: Self-pay | Admitting: Family Medicine

## 2021-02-25 ENCOUNTER — Encounter: Payer: Self-pay | Admitting: Nurse Practitioner

## 2021-02-25 DIAGNOSIS — R112 Nausea with vomiting, unspecified: Secondary | ICD-10-CM

## 2021-02-25 MED ORDER — ONDANSETRON 8 MG PO TBDP: 8.0000 mg | ORAL_TABLET | Freq: Three times a day (TID) | ORAL | 0 refills | Status: DC | PRN

## 2021-03-23 ENCOUNTER — Encounter: Payer: Self-pay | Admitting: Family Medicine

## 2021-03-23 ENCOUNTER — Other Ambulatory Visit: Payer: Self-pay | Admitting: Nurse Practitioner

## 2021-03-24 ENCOUNTER — Other Ambulatory Visit: Payer: Self-pay

## 2021-03-24 ENCOUNTER — Telehealth: Payer: Self-pay | Admitting: Adult Health

## 2021-03-24 MED ORDER — LARIN FE 1.5/30 1.5-30 MG-MCG PO TABS: 1.0000 | ORAL_TABLET | Freq: Every day | ORAL | 0 refills | Status: DC

## 2021-03-24 MED ORDER — INSULIN ASPART 100 UNIT/ML FLEXPEN: PEN_INJECTOR | SUBCUTANEOUS | 5 refills | Status: DC

## 2021-03-24 MED ORDER — ROSUVASTATIN CALCIUM 10 MG PO TABS: 10.0000 mg | ORAL_TABLET | Freq: Every day | ORAL | 0 refills | Status: DC

## 2021-03-24 NOTE — Addendum Note (Signed)
Addended by: Cyril Mourning A on: 03/24/2021 05:04 PM   Modules accepted: Orders

## 2021-03-24 NOTE — Telephone Encounter (Signed)
Patient calling wanting to know if she could get a refill on her birth control until her appointment end of January and script sent to [facility] in [facility]

## 2021-03-24 NOTE — Telephone Encounter (Signed)
Pt aware OCs refilled. 

## 2021-04-22 ENCOUNTER — Encounter: Payer: Self-pay | Admitting: Nurse Practitioner

## 2021-04-24 ENCOUNTER — Other Ambulatory Visit: Payer: Self-pay | Admitting: Nurse Practitioner

## 2021-04-25 ENCOUNTER — Other Ambulatory Visit: Payer: Self-pay

## 2021-04-25 ENCOUNTER — Ambulatory Visit (INDEPENDENT_AMBULATORY_CARE_PROVIDER_SITE_OTHER): Payer: 59 | Admitting: Nurse Practitioner

## 2021-04-25 VITALS — BP 133/81 | HR 65 | Temp 97.3°F | Ht 63.0 in | Wt 240.6 lb

## 2021-04-25 DIAGNOSIS — R609 Edema, unspecified: Secondary | ICD-10-CM | POA: Diagnosis not present

## 2021-04-25 DIAGNOSIS — E1069 Type 1 diabetes mellitus with other specified complication: Secondary | ICD-10-CM

## 2021-04-25 MED ORDER — INSULIN DETEMIR 100 UNIT/ML FLEXPEN: PEN_INJECTOR | SUBCUTANEOUS | 2 refills | Status: DC

## 2021-04-25 MED ORDER — DEXCOM G6 TRANSMITTER MISC: 0 refills | Status: DC

## 2021-04-25 MED ORDER — DEXCOM G6 SENSOR MISC: 0 refills | Status: DC

## 2021-04-25 NOTE — Progress Notes (Signed)
° °  Subjective:    Patient ID: Kimberly Becker, female    DOB: Jun 02, 1980, 41 y.o.   MRN: [id number]  HPI Presents for routine follow-up on her diabetes.  We will need to make some changes in her regimen due to her insurance. Had her eye exam last fall, states she had no diabetic retinopathy. Doing very well with her diet overall.  1 major change is decreasing the amount of sodium in her diet.  Has seen significant improvement in her lower extremity edema. No regular exercise but staying very active taking care of her children. Has a preventive health physical schedule with gynecology on 1/25, plans to get her lab work done at that time from previous order.  Stopped her spironolactone, states she did not see significant results.  Review of Systems  Constitutional:  Negative for activity change and appetite change.  Respiratory:  Negative for chest tightness and shortness of breath.   Cardiovascular:  Negative for chest pain.       LE edema much improved on low sodium diet.       Objective:   Physical Exam NAD.  Alert, oriented.  Cheerful calm affect.  Lungs clear.  Heart regular rate rhythm.  Lower extremities trace pitting edema.  Today's Vitals   04/25/21 0914  BP: 133/81  Pulse: 65  Temp: (!) 97.3 F (36.3 C)  SpO2: 99%  Weight: 240 lb 9.6 oz (109.1 kg)  Height: 5\' 3"  (1.6 m)   Body mass index is 42.62 kg/m. Continues to maintain her weight loss.       Assessment & Plan:   Problem List Items Addressed This Visit       Endocrine   Type 1 diabetes mellitus (HCC) - Primary   Relevant Medications   insulin detemir (LEVEMIR) 100 UNIT/ML FlexPen     Other   Peripheral edema   Meds ordered this encounter  Medications   insulin detemir (LEVEMIR) 100 UNIT/ML FlexPen    Sig: Inject 20 units into the skin qhs    Dispense:  15 mL    Refill:  2    Order Specific Question:   Supervising Provider    Answer:   A [[id number]]   Continuous Blood Gluc Sensor (DEXCOM  G6 SENSOR) MISC    Sig: Apply one sensor as directed every 10 days    Dispense:  9 each    Refill:  0    Order Specific Question:   Supervising Provider    Answer:   Lilyan Punt A [[id number]]   Continuous Blood Gluc Transmit (DEXCOM G6 TRANSMITTER) MISC    Sig: Apply as directed with sensor for 3 months    Dispense:  1 each    Refill:  0    Order Specific Question:   Supervising Provider    Answer:   Lilyan Punt A [[id number]]   Patient switched to Levemir insulin.  Also switch to Dexcom G6 for continuous blood glucose monitoring.  These changes were made due to preferences per insurance. Continue healthy diet and weight loss efforts.  Encourage patient to begin regular activity such as walking. Patient to get her previously ordered lab on 1/25 as planned. Return in about 3 months (around 07/24/2021).

## 2021-04-26 ENCOUNTER — Encounter: Payer: Self-pay | Admitting: Nurse Practitioner

## 2021-04-28 ENCOUNTER — Encounter: Payer: Self-pay | Admitting: Nurse Practitioner

## 2021-05-07 ENCOUNTER — Ambulatory Visit (INDEPENDENT_AMBULATORY_CARE_PROVIDER_SITE_OTHER): Payer: 59 | Admitting: Adult Health

## 2021-05-07 ENCOUNTER — Other Ambulatory Visit: Payer: Self-pay

## 2021-05-07 ENCOUNTER — Encounter: Payer: Self-pay | Admitting: Nurse Practitioner

## 2021-05-07 ENCOUNTER — Encounter: Payer: Self-pay | Admitting: Adult Health

## 2021-05-07 ENCOUNTER — Other Ambulatory Visit (HOSPITAL_COMMUNITY)
Admission: RE | Admit: 2021-05-07 | Discharge: 2021-05-07 | Disposition: A | Discharge status: Home / Self Care | Payer: 59 | Source: Ambulatory Visit | Attending: Adult Health | Admitting: Adult Health

## 2021-05-07 VITALS — BP 123/77 | HR 78 | Ht 63.0 in | Wt 239.0 lb

## 2021-05-07 DIAGNOSIS — Z1211 Encounter for screening for malignant neoplasm of colon: Secondary | ICD-10-CM | POA: Diagnosis not present

## 2021-05-07 DIAGNOSIS — Z319 Encounter for procreative management, unspecified: Secondary | ICD-10-CM

## 2021-05-07 DIAGNOSIS — Z3041 Encounter for surveillance of contraceptive pills: Secondary | ICD-10-CM | POA: Insufficient documentation

## 2021-05-07 DIAGNOSIS — Z01419 Encounter for gynecological examination (general) (routine) without abnormal findings: Secondary | ICD-10-CM | POA: Insufficient documentation

## 2021-05-07 LAB — HEMOCCULT GUIAC POC 1CARD (OFFICE): Fecal Occult Blood, POC: NEGATIVE

## 2021-05-07 MED ORDER — PRENATAL PLUS 27-1 MG PO TABS: 1.0000 | ORAL_TABLET | Freq: Every day | ORAL | 12 refills | Status: DC

## 2021-05-07 NOTE — Progress Notes (Signed)
Patient ID: Kimberly Becker, female   DOB: September 18, 1980, 41 y.o.   MRN: [id number] History of Present Illness: Kimberly Becker is a 41 year old white female, married, G2P0020, in for a well woman gyn exam and pap and want to get pregnant. A1c is 6ish she says . She had 1 miscarrge after fall and 1 when Diabetes out of control A1c 12+.  PCP is Dr Lacinda Axon.   Current Medications, Allergies, Past Medical History, Past Surgical History, Family History and Social History were reviewed in [employer] record.     Review of Systems:  Patient denies any headaches, hearing loss, fatigue, blurred vision, shortness of breath, chest pain, abdominal pain, problems with bowel movements, urination, or intercourse. No joint pain or mood swings.  Decreased sex drive.   Physical Exam:BP 123/77 (BP Location: Right Arm, Patient Position: Sitting, Cuff Size: Large)    Pulse 78    Ht 5\' 3"  (1.6 m)    Wt 239 lb (108.4 kg)    LMP 04/21/2021 (Exact Date)    BMI 42.34 kg/m   General:  Well developed, well nourished, no acute distress Skin:  Warm and dry Neck:  Midline trachea, normal thyroid, good ROM, no lymphadenopathy Lungs; Clear to auscultation bilaterally Breast:  No dominant palpable mass, retraction, or nipple discharge Cardiovascular: Regular rate and rhythm Abdomen:  Soft, non tender, no hepatosplenomegaly Pelvic:  External genitalia is normal in appearance, no lesions.  The vagina is normal in appearance. Urethra has no lesions or masses. The cervix is bulbous.Pap with HR HPV genotyping performed.  Uterus is felt to be normal size, shape, and contour.  No adnexal masses or tenderness noted.Bladder is non tender, no masses felt. Rectal: Good sphincter tone, no polyps, or hemorrhoids felt.  Hemoccult negative. Extremities/musculoskeletal:  No swelling or varicosities noted, no clubbing or cyanosis Psych:  No mood changes, alert and cooperative,seems happy AA is 0 Fall risk is low Depression  screen [facility] 2/9 05/07/2021 01/23/2021 12/20/2020  Decreased Interest 0 0 0  Down, Depressed, Hopeless 0 0 0  PHQ - 2 Score 0 0 0  Altered sleeping 0 - -  Tired, decreased energy 0 - -  Change in appetite 0 - -  Feeling bad or failure about yourself  0 - -  Trouble concentrating 0 - -  Moving slowly or fidgety/restless 0 - -  Suicidal thoughts 0 - -  PHQ-9 Score 0 - -  Difficult doing work/chores - - -    GAD 7 : Generalized Anxiety Score 05/07/2021 01/16/2020  Nervous, Anxious, on Edge 0 0  Control/stop worrying 0 0  Worry too much - different things 0 0  Trouble relaxing 0 0  Restless 0 0  Easily annoyed or irritable 0 0  Afraid - awful might happen 0 0  Total GAD 7 Score 0 0  Anxiety Difficulty - Not difficult at all    Upstream - 05/07/21 1026       Pregnancy Intention Screening   Does the patient want to become pregnant in the next year? Yes    Does the patient's partner want to become pregnant in the next year? Yes    Would the patient like to discuss contraceptive options today? No      Contraception Wrap Up   Current Method Oral Contraceptive    End Method Pregnant/Seeking Pregnancy    Contraception Counseling Provided No              Examination chaperoned by [employer]  Neas RN  Impression and Plan: 1. Encounter for gynecological examination with Papanicolaou smear of cervix Pap sent Physical in 1 year Pap in 3 years if normal   2. Encounter for screening fecal occult blood testing   3. Patient desires pregnancy Can stop OCs at end of this pack Start PNV Call with period when off OCs, will check progesterone level to see if ovulating.

## 2021-05-12 LAB — CYTOLOGY - PAP
Adequacy: ABSENT
Comment: NEGATIVE
Comment: NEGATIVE
Diagnosis: NEGATIVE
HPV 16: NEGATIVE
HPV 18 / 45: NEGATIVE
High risk HPV: POSITIVE — AB

## 2021-05-13 ENCOUNTER — Encounter: Payer: Self-pay | Admitting: Adult Health

## 2021-05-13 DIAGNOSIS — R87618 Other abnormal cytological findings on specimens from cervix uteri: Secondary | ICD-10-CM | POA: Insufficient documentation

## 2021-05-13 HISTORY — DX: Other abnormal cytological findings on specimens from cervix uteri: R87.618

## 2021-06-10 ENCOUNTER — Other Ambulatory Visit: Payer: Self-pay | Admitting: Adult Health

## 2021-06-10 DIAGNOSIS — Z319 Encounter for procreative management, unspecified: Secondary | ICD-10-CM

## 2021-06-10 NOTE — Progress Notes (Signed)
Ck progesterone 3/20

## 2021-06-17 ENCOUNTER — Encounter: Payer: Self-pay | Admitting: Nurse Practitioner

## 2021-06-17 ENCOUNTER — Other Ambulatory Visit: Payer: Self-pay | Admitting: Family Medicine

## 2021-06-18 ENCOUNTER — Encounter: Payer: Self-pay | Admitting: Family Medicine

## 2021-06-20 ENCOUNTER — Other Ambulatory Visit: Payer: Self-pay | Admitting: Nurse Practitioner

## 2021-06-23 ENCOUNTER — Other Ambulatory Visit: Payer: Self-pay | Admitting: Family Medicine

## 2021-06-30 DIAGNOSIS — E785 Hyperlipidemia, unspecified: Secondary | ICD-10-CM | POA: Diagnosis not present

## 2021-06-30 DIAGNOSIS — Z319 Encounter for procreative management, unspecified: Secondary | ICD-10-CM | POA: Diagnosis not present

## 2021-06-30 DIAGNOSIS — Z79899 Other long term (current) drug therapy: Secondary | ICD-10-CM | POA: Diagnosis not present

## 2021-07-01 ENCOUNTER — Encounter: Payer: Self-pay | Admitting: Nurse Practitioner

## 2021-07-01 LAB — HEPATIC FUNCTION PANEL
ALT: 131 IU/L — ABNORMAL HIGH (ref 0–32)
AST: 106 IU/L — ABNORMAL HIGH (ref 0–40)
Albumin: 4.6 g/dL (ref 3.8–4.8)
Alkaline Phosphatase: 118 IU/L (ref 44–121)
Bilirubin Total: 0.5 mg/dL (ref 0.0–1.2)
Bilirubin, Direct: 0.17 mg/dL (ref 0.00–0.40)
Total Protein: 7.4 g/dL (ref 6.0–8.5)

## 2021-07-01 LAB — LIPID PANEL
Chol/HDL Ratio: 2.7 ratio (ref 0.0–4.4)
Cholesterol, Total: 187 mg/dL (ref 100–199)
HDL: 69 mg/dL (ref >39)
LDL Chol Calc (NIH): 97 mg/dL (ref 0–99)
Triglycerides: 118 mg/dL (ref 0–149)
VLDL Cholesterol Cal: 21 mg/dL (ref 5–40)

## 2021-07-01 LAB — PROGESTERONE: Progesterone: 0.1 ng/mL

## 2021-07-04 ENCOUNTER — Other Ambulatory Visit: Payer: Self-pay | Admitting: *Deleted

## 2021-07-04 DIAGNOSIS — R748 Abnormal levels of other serum enzymes: Secondary | ICD-10-CM

## 2021-07-07 ENCOUNTER — Ambulatory Visit (INDEPENDENT_AMBULATORY_CARE_PROVIDER_SITE_OTHER): Payer: 59 | Admitting: Family Medicine

## 2021-07-07 ENCOUNTER — Other Ambulatory Visit: Payer: Self-pay

## 2021-07-07 VITALS — BP 109/72 | HR 83 | Temp 98.0°F | Wt 246.0 lb

## 2021-07-07 DIAGNOSIS — R7989 Other specified abnormal findings of blood chemistry: Secondary | ICD-10-CM | POA: Diagnosis not present

## 2021-07-07 NOTE — Assessment & Plan Note (Signed)
Likely due to recent illness and Tylenol use.  Plan to repeat labs in 4 to 6 weeks.  Labs ordered.  Patient is to continue her current medications including Crestor. ?

## 2021-07-07 NOTE — Progress Notes (Signed)
? ?Subjective:  ?Patient ID: Kimberly Becker, female    DOB: [birth year]  Age: 41 y.o. MRN: [id number] ? ?CC: ?Chief Complaint  ?Patient presents with  ? Elevated Hepatic Enzymes  ?  Pt states she had tooth break off in January and was on multiple round of antibiotics and pt states that may have affected liver enzymes, was taking Tylenol "like tic tacs".  ? ? ?HPI: ? ?41 year old female presents for follow-up regarding the above. ? ?Patient recently had lab work done which revealed elevated LFTs -AST 106, ALT 131.  Patient states that she has recently dealt with a dental issue and was taking quite a bit of Tylenol.  She is concerned that this has contributed to her elevated liver function test.  She is currently feeling well.  No abdominal pain.  No fever.  No respiratory symptoms. ? ?Patient Active Problem List  ? Diagnosis Date Noted  ? Elevated LFTs 07/07/2021  ? 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 Patient desires pregnancy 05/07/2021  ? Carpal tunnel syndrome 01/23/2021  ? Hirsutism 12/21/2020  ? Gastroesophageal reflux disease without esophagitis 09/19/2020  ? Hypertension associated with diabetes ([place]) 03/22/2020  ? Irregular bleeding 01/16/2020  ? Diabetic neuropathy, painful ([place]) 02/14/2015  ? Type 1 diabetes mellitus ([place]) 09/26/2014  ? Anxiety 06/26/2013  ? Hyperlipidemia 03/20/2013  ? Morbid obesity ([place]) 11/08/2012  ? Migraines 08/10/2012  ? ? ?Social Hx   ?Social History  ? ?Socioeconomic History  ? Marital status: Married  ?  Spouse name: Not on file  ? Number of children: Not on file  ? Years of education: 66  ? Highest education level: Not on file  ?Occupational History  ? Occupation: Print production planner  ?Tobacco Use  ? Smoking status: Never  ? Smokeless tobacco: Never  ?Vaping Use  ? Vaping Use: Never used  ?Substance and Sexual Activity  ? Alcohol use: No  ? Drug use: No  ? Sexual activity: Yes  ?  Birth control/protection: Pill  ?   Comment: heterosexual relationship  ?Other Topics Concern  ? Not on file  ?Social History Narrative  ? Not on file  ? ?Social Determinants of Health  ? ?Financial Resource Strain: Low Risk   ? Difficulty of Paying Living Expenses: Not hard at all  ?Food Insecurity: No Food Insecurity  ? Worried About Charity fundraiser in the Last Year: Never true  ? Ran Out of Food in the Last Year: Never true  ?Transportation Needs: No Transportation Needs  ? Lack of Transportation (Medical): No  ? Lack of Transportation (Non-Medical): No  ?Physical Activity: Insufficiently Active  ? Days of Exercise per Week: 2 days  ? Minutes of Exercise per Session: 20 min  ?Stress: No Stress Concern Present  ? Feeling of Stress : Only a little  ?Social Connections: Unknown  ? Frequency of Communication with Friends and Family: More than three times a week  ? Frequency of Social Gatherings with Friends and Family: Once a week  ? Attends Religious Services: Patient refused  ? Active Member of Clubs or Organizations: Patient refused  ? Attends Archivist Meetings: Patient refused  ? Marital Status: Married  ? ? ?Review of Systems ?Per HPI ? ?Objective:  ?BP 109/72   Pulse 83   Temp 98 ?F (36.7 ?C)   Wt 246 lb (111.6 kg)   SpO2 100%   BMI 43.58 kg/m?  ? ? ?  07/07/2021  ?  8:47 AM 05/07/2021  ? 10:27 AM 04/25/2021  ?  9:14 AM  ?BP/Weight  ?Systolic BP 993 716 967  ?Diastolic BP 72 77 81  ?Wt. (Lbs) 246 239 240.6  ?BMI 43.58 kg/m2 42.34 kg/m2 42.62 kg/m2  ? ? ?Physical Exam ?Vitals and nursing note reviewed.  ?Constitutional:   ?   General: She is not in acute distress. ?   Appearance: Normal appearance. She is obese.  ?HENT:  ?   Head: Normocephalic and atraumatic.  ?Cardiovascular:  ?   Rate and Rhythm: Normal rate and regular rhythm.  ?Pulmonary:  ?   Effort: Pulmonary effort is normal.  ?   Breath sounds: Normal breath sounds. No wheezing, rhonchi or rales.  ?Abdominal:  ?   General: There is no distension.  ?   Palpations: Abdomen  is soft.  ?   Tenderness: There is no abdominal tenderness.  ?Neurological:  ?   Mental Status: She is alert.  ?Psychiatric:     ?   Mood and Affect: Mood normal.     ?   Behavior: Behavior normal.  ? ? ?Lab Results  ?Component Value Date  ? WBC 11.5 (H) 12/20/2020  ? HGB 13.4 12/20/2020  ? HCT 40.1 12/20/2020  ? PLT 361 12/20/2020  ? GLUCOSE 167 (H) 09/19/2020  ? CHOL 187 06/30/2021  ? TRIG 118 06/30/2021  ? HDL 69 06/30/2021  ? Posey 97 06/30/2021  ? ALT 131 (H) 06/30/2021  ? AST 106 (H) 06/30/2021  ? NA 139 09/19/2020  ? K 4.2 09/19/2020  ? CL 99 09/19/2020  ? CREATININE 0.93 09/19/2020  ? BUN 15 09/19/2020  ? CO2 23 09/19/2020  ? TSH 1.280 09/19/2020  ? HGBA1C 6.8 (H) 12/20/2020  ? MICROALBUR 0.2 05/18/2014  ? ? ? ?Assessment & Plan:  ? ?Problem List Items Addressed This Visit   ? ?  ? Other  ? Elevated LFTs - Primary  ?  Likely due to recent illness and Tylenol use.  Plan to repeat labs in 4 to 6 weeks.  Labs ordered.  Patient is to continue her current medications including Crestor. ?  ?  ? Relevant Orders  ? CMP14+EGFR  ? Gamma GT  ? ?Thersa Salt DO ?[place] ? ?

## 2021-07-07 NOTE — Patient Instructions (Signed)
Labs in 4 to 6 weeks. ? ?No more than 3000 mg of Tylenol daily. ? ?Follow up in 3-6 months (pending lab work results). ? ?Take care ? ?Dr. Adriana Simas  ?

## 2021-07-11 ENCOUNTER — Other Ambulatory Visit: Payer: Self-pay | Admitting: Adult Health

## 2021-07-11 MED ORDER — CLOMIPHENE CITRATE 50 MG PO TABS: ORAL_TABLET | ORAL | 2 refills | Status: DC

## 2021-07-11 NOTE — Progress Notes (Signed)
Rx clomid 

## 2021-07-15 ENCOUNTER — Encounter: Payer: Self-pay | Admitting: Family Medicine

## 2021-07-15 ENCOUNTER — Other Ambulatory Visit: Payer: Self-pay | Admitting: Family Medicine

## 2021-07-21 ENCOUNTER — Other Ambulatory Visit: Payer: Self-pay | Admitting: Adult Health

## 2021-07-21 DIAGNOSIS — Z319 Encounter for procreative management, unspecified: Secondary | ICD-10-CM

## 2021-07-21 NOTE — Progress Notes (Signed)
Ck progesterone 08/08/21. ?

## 2021-08-01 ENCOUNTER — Ambulatory Visit (INDEPENDENT_AMBULATORY_CARE_PROVIDER_SITE_OTHER): Payer: 59 | Admitting: Nurse Practitioner

## 2021-08-01 VITALS — BP 110/72 | HR 77 | Temp 97.5°F | Ht 63.0 in | Wt 249.0 lb

## 2021-08-01 DIAGNOSIS — K219 Gastro-esophageal reflux disease without esophagitis: Secondary | ICD-10-CM | POA: Diagnosis not present

## 2021-08-01 DIAGNOSIS — I152 Hypertension secondary to endocrine disorders: Secondary | ICD-10-CM

## 2021-08-01 DIAGNOSIS — E1069 Type 1 diabetes mellitus with other specified complication: Secondary | ICD-10-CM

## 2021-08-01 DIAGNOSIS — R7989 Other specified abnormal findings of blood chemistry: Secondary | ICD-10-CM | POA: Diagnosis not present

## 2021-08-01 DIAGNOSIS — E1159 Type 2 diabetes mellitus with other circulatory complications: Secondary | ICD-10-CM

## 2021-08-01 MED ORDER — FREESTYLE LIBRE 2 SENSOR MISC: 5 refills | Status: DC

## 2021-08-01 MED ORDER — INSULIN DETEMIR 100 UNIT/ML FLEXPEN: PEN_INJECTOR | SUBCUTANEOUS | 5 refills | Status: DC

## 2021-08-01 MED ORDER — INSULIN ASPART 100 UNIT/ML FLEXPEN: PEN_INJECTOR | SUBCUTANEOUS | 5 refills | Status: DC

## 2021-08-01 MED ORDER — PANTOPRAZOLE SODIUM 40 MG PO TBEC: DELAYED_RELEASE_TABLET | ORAL | 1 refills | Status: DC

## 2021-08-01 NOTE — Progress Notes (Signed)
? ?  Subjective:  ? ? Patient ID: Kimberly Becker, female    DOB: 1981/02/17, 41 y.o.   MRN: [id number] ? ?Hypertension ?This is a chronic problem. Treatments tried: valsartan.  ?Diabetes ?She presents for her follow-up diabetic visit. She has type 1 diabetes mellitus. Current diabetic treatments: novolog, levemir insulin.  ? ?Refill medications  ? ?Review of Systems ? ?   ?Objective:  ? Physical Exam ? ? ? ? ?   ?Assessment & Plan:  ? ? ?

## 2021-08-01 NOTE — Progress Notes (Signed)
? ?Subjective:  ? ? Patient ID: Kimberly Becker, female    DOB: May 08, 1980, 41 y.o.   MRN: [id number] ? ?Hypertension ?Pertinent negatives include no chest pain, headaches, palpitations or shortness of breath.  ?Presents for routine follow-up on her diabetes. Had her eye exam this year. states she had no diabetic retinopathy. Trying healthier diet options, no sodas drinking more flavored water. ?No regular exercise but staying very active taking care of her children. Has a wrist BP machine but prefers to use BP machine at [employer]. Denies side effects of valsartan. Using [employer], fasting blood sugars in 120's. Report adherence to current medications. Currently trying fertility treatment to get pregnant.  ?Had a significant increase in LFTs on 06/30/21. Was seen by Dr. Adriana Simas. Back on her full dose of Rosuvastatin 10 mg. Has not had repeat labs. Relates issues to dental surgery and excessive use of Tylenol. No alcohol use.  ?GERD stable on Pantoprazole.  ? ?Review of Systems  ?Constitutional:  Negative for chills and fatigue.  ?HENT:  Negative for dental problem.   ?Respiratory:  Negative for cough, chest tightness, shortness of breath and wheezing.   ?Cardiovascular:  Negative for chest pain, palpitations and leg swelling.  ?Gastrointestinal:  Negative for abdominal pain, constipation, diarrhea, nausea and vomiting.  ?Endocrine: Negative for polydipsia, polyphagia and polyuria.  ?Neurological:  Negative for dizziness, light-headedness and headaches.  ? ?   ?Objective:  ? Physical Exam ?Vitals and nursing note reviewed.  ?Constitutional:   ?   General: She is not in acute distress. ?   Appearance: Normal appearance.  ?Neck:  ?   Vascular: No carotid bruit.  ?Cardiovascular:  ?   Rate and Rhythm: Normal rate.  ?   Pulses: Normal pulses.  ?   Heart sounds: Normal heart sounds. No murmur heard. ?  No friction rub. No gallop.  ?Pulmonary:  ?   Breath sounds: Normal breath sounds. No wheezing.  ?Chest:  ?   Chest  wall: No tenderness.  ?Abdominal:  ?   General: Bowel sounds are normal. There is no distension.  ?   Palpations: Abdomen is soft. There is no mass.  ?   Tenderness: There is no abdominal tenderness.  ?Musculoskeletal:  ?   Cervical back: No rigidity or tenderness.  ?   Right lower leg: No edema.  ?   Left lower leg: No edema.  ?Skin: ?   General: Skin is warm and dry.  ?Neurological:  ?   Mental Status: She is alert and oriented to person, place, and time.  ?Psychiatric:     ?   Mood and Affect: Mood normal.     ?   Behavior: Behavior normal.     ?   Thought Content: Thought content normal.     ?   Judgment: Judgment normal.  ? ? ?Today's Vitals  ? 08/01/21 0854  ?BP: 110/72  ?Pulse: 77  ?Temp: (!) 97.5 ?F (36.4 ?C)  ?SpO2: 100%  ?Weight: 249 lb (112.9 kg)  ?Height: 5\' 3"  (1.6 m)  ? ?Body mass index is 44.11 kg/m?.  ?Diabetic Foot Exam - Simple   ?Simple Foot Form ? 08/01/2021 10:16 AM  ?Visual Inspection ?No deformities, no ulcerations, no other skin breakdown bilaterally: Yes ?Sensation Testing ?Intact to touch and monofilament testing bilaterally: Yes ?Pulse Check ?Posterior Tibialis and Dorsalis pulse intact bilaterally: Yes ?Comments ?  ?  ?  ? ?   ?Assessment & Plan:  ? ?Problem List Items Addressed This Visit   ? ?  ?  Cardiovascular and Mediastinum  ? Hypertension associated with diabetes (HCC)  ? Relevant Medications  ? insulin aspart (NOVOLOG) 100 UNIT/ML FlexPen  ? insulin detemir (LEVEMIR) 100 UNIT/ML FlexPen  ? Other Relevant Orders  ? Comprehensive metabolic panel  ? Hemoglobin A1c  ?  ? Endocrine  ? Type 1 diabetes mellitus (HCC) - Primary  ? Relevant Medications  ? insulin aspart (NOVOLOG) 100 UNIT/ML FlexPen  ? insulin detemir (LEVEMIR) 100 UNIT/ML FlexPen  ? Other Relevant Orders  ? Comprehensive metabolic panel  ? Hemoglobin A1c  ?  ?Meds ordered this encounter  ?Medications  ? Continuous Blood Gluc Sensor (FREESTYLE LIBRE 2 SENSOR) MISC  ?  Sig: APPLY SENSOR TO SKIN AS DIRECTED FOR 14 DAYS, THEN  REMOVE AND REPLACE  ?  Dispense:  2 each  ?  Refill:  5  ? insulin aspart (NOVOLOG) 100 UNIT/ML FlexPen  ?  Sig: INJECT 0-20 UNITS SUBCUTANEOUSLY 4 TIMES DAILY BEFORE MEALS AND AT BEDTIME AS PER SLIDING SCALE,  ?  Dispense:  15 mL  ?  Refill:  5  ? insulin detemir (LEVEMIR) 100 UNIT/ML FlexPen  ?  Sig: Inject 20 units into the skin qhs  ?  Dispense:  15 mL  ?  Refill:  5  ? pantoprazole (PROTONIX) 40 MG tablet  ?  Sig: TAKE 1 TABLET BY MOUTH ONCE DAILY AS NEEDED FOR ACID REFLUX  ?  Dispense:  90 tablet  ?  Refill:  1  ?  ?Continue healthy diet and weight loss efforts.   ?Encourage patient to begin regular activity such as walking. ?Patient to get labs to recheck A1c, and hepatic panel due to previously elevated levels. If LFT levels remain significantly elevated, agrees to get further labs.  ?Continue current medications as directed.  ?Return in about 3 months (around 10/31/2021). ? ? ?Return in about 3 months (around 10/31/2021).  ? ?

## 2021-08-02 ENCOUNTER — Encounter: Payer: Self-pay | Admitting: Nurse Practitioner

## 2021-08-04 ENCOUNTER — Other Ambulatory Visit: Payer: Self-pay | Admitting: Nurse Practitioner

## 2021-08-04 DIAGNOSIS — I152 Hypertension secondary to endocrine disorders: Secondary | ICD-10-CM | POA: Diagnosis not present

## 2021-08-04 DIAGNOSIS — E1159 Type 2 diabetes mellitus with other circulatory complications: Secondary | ICD-10-CM | POA: Diagnosis not present

## 2021-08-04 DIAGNOSIS — E1069 Type 1 diabetes mellitus with other specified complication: Secondary | ICD-10-CM | POA: Diagnosis not present

## 2021-08-05 ENCOUNTER — Other Ambulatory Visit (HOSPITAL_COMMUNITY): Payer: Self-pay | Admitting: Adult Health

## 2021-08-05 DIAGNOSIS — Z1231 Encounter for screening mammogram for malignant neoplasm of breast: Secondary | ICD-10-CM

## 2021-08-05 LAB — COMPREHENSIVE METABOLIC PANEL
ALT: 52 IU/L — ABNORMAL HIGH (ref 0–32)
AST: 34 IU/L (ref 0–40)
Albumin/Globulin Ratio: 1.7 (ref 1.2–2.2)
Albumin: 4.5 g/dL (ref 3.8–4.8)
Alkaline Phosphatase: 109 IU/L (ref 44–121)
BUN/Creatinine Ratio: 21 (ref 9–23)
BUN: 21 mg/dL (ref 6–24)
Bilirubin Total: 0.8 mg/dL (ref 0.0–1.2)
CO2: 27 mmol/L (ref 20–29)
Calcium: 10.2 mg/dL (ref 8.7–10.2)
Chloride: 97 mmol/L (ref 96–106)
Creatinine, Ser: 1.01 mg/dL — ABNORMAL HIGH (ref 0.57–1.00)
Globulin, Total: 2.7 g/dL (ref 1.5–4.5)
Glucose: 182 mg/dL — ABNORMAL HIGH (ref 70–99)
Potassium: 4.4 mmol/L (ref 3.5–5.2)
Sodium: 139 mmol/L (ref 134–144)
Total Protein: 7.2 g/dL (ref 6.0–8.5)
eGFR: 71 mL/min/{1.73_m2} (ref >59)

## 2021-08-05 LAB — HEMOGLOBIN A1C
Est. average glucose Bld gHb Est-mCnc: 128 mg/dL
Hgb A1c MFr Bld: 6.1 % — ABNORMAL HIGH (ref 4.8–5.6)

## 2021-08-19 ENCOUNTER — Ambulatory Visit: Payer: 59 | Admitting: Adult Health

## 2021-08-19 ENCOUNTER — Encounter: Payer: Self-pay | Admitting: Adult Health

## 2021-08-19 VITALS — BP 110/64 | HR 67 | Ht 63.0 in | Wt 251.0 lb

## 2021-08-19 DIAGNOSIS — N926 Irregular menstruation, unspecified: Secondary | ICD-10-CM | POA: Insufficient documentation

## 2021-08-19 DIAGNOSIS — Z319 Encounter for procreative management, unspecified: Secondary | ICD-10-CM

## 2021-08-19 LAB — POCT URINE PREGNANCY: Preg Test, Ur: NEGATIVE

## 2021-08-19 NOTE — Progress Notes (Signed)
?  Subjective:  ?  ? Patient ID: Kimberly Becker, female   DOB: 1980-12-25, 41 y.o.   MRN: [id number] ? ?HPI ?Kimberly Becker is a 41 year old white female, married, G2P0020 in complaining of no period since January. Had progesterone level of 0.1 in March and A1c was 6.1 on 08/04/21. She tried clomid 1 month. She wants to be pregnant. ?PCP is Dr Adriana Simas ?Lab Results  ?Component Value Date  ? DIAGPAP  05/07/2021  ?  - Negative for intraepithelial lesion or malignancy (NILM)  ? 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  ? ?Review of Systems ?No period since January ?Denies any hot flashes ?Reviewed past medical,surgical, social and family history. Reviewed medications and allergies.  ?   ?Objective:  ? Physical Exam ?BP 110/64 (BP Location: Right Arm, Patient Position: Sitting, Cuff Size: Normal)   Pulse 67   Ht 5\' 3"  (1.6 m)   Wt 251 lb (113.9 kg)   LMP 04/21/2021 (Exact Date)   BMI 44.46 kg/m?  UPT is negative. ?Skin warm and dry.  Lungs: clear to ausculation bilaterally. Cardiovascular: regular rate and rhythm.  ?   ? [place] - 08/19/21 1210   ? ?  ? Pregnancy Intention Screening  ? Does the patient want to become pregnant in the next year? Yes   ? Does the patient's partner want to become pregnant in the next year? Yes   ? Would the patient like to discuss contraceptive options today? No   ?  ? Contraception Wrap Up  ? Current Method Pregnant/Seeking Pregnancy   ? End Method Pregnant/Seeking Pregnancy   ? ?  ?  ? ?  ?  ?Assessment:  ?  1. Patient desires pregnancy ?- POCT urine pregnancy ? ?2. Missed periods ?Will check labs  ?- TSH ?- Follicle stimulating hormone ?- Prolactin ?- Estradiol  ?Check QHCG  ?   ?Plan:  ?   ?Will talk when labs back ?Follow TBD ?   ?

## 2021-08-20 LAB — FOLLICLE STIMULATING HORMONE: FSH: 40.8 m[IU]/mL

## 2021-08-20 LAB — BETA HCG QUANT (REF LAB): hCG Quant: <1 m[IU]/mL

## 2021-08-20 LAB — ESTRADIOL: Estradiol: 32 pg/mL

## 2021-08-20 LAB — TSH: TSH: 1.81 u[IU]/mL (ref 0.450–4.500)

## 2021-08-20 LAB — PROLACTIN: Prolactin: 6.5 ng/mL (ref 4.8–23.3)

## 2021-08-21 ENCOUNTER — Encounter: Payer: Self-pay | Admitting: Nurse Practitioner

## 2021-08-21 ENCOUNTER — Encounter: Payer: Self-pay | Admitting: Family Medicine

## 2021-08-21 ENCOUNTER — Telehealth: Payer: Self-pay | Admitting: Adult Health

## 2021-08-21 NOTE — Telephone Encounter (Signed)
Pt aware of labs  

## 2021-08-22 ENCOUNTER — Other Ambulatory Visit: Payer: Self-pay | Admitting: Nurse Practitioner

## 2021-08-25 ENCOUNTER — Ambulatory Visit: Payer: 59 | Admitting: Family Medicine

## 2021-08-25 ENCOUNTER — Other Ambulatory Visit: Payer: Self-pay | Admitting: Family Medicine

## 2021-08-25 ENCOUNTER — Encounter: Payer: Self-pay | Admitting: Family Medicine

## 2021-08-25 MED ORDER — INSULIN ASPART 100 UNIT/ML FLEXPEN: PEN_INJECTOR | SUBCUTANEOUS | 3 refills | Status: DC

## 2021-09-16 ENCOUNTER — Encounter: Payer: Self-pay | Admitting: Nurse Practitioner

## 2021-09-23 ENCOUNTER — Encounter: Payer: Self-pay | Admitting: Family Medicine

## 2021-09-23 ENCOUNTER — Ambulatory Visit (INDEPENDENT_AMBULATORY_CARE_PROVIDER_SITE_OTHER): Payer: 59 | Admitting: Family Medicine

## 2021-09-23 VITALS — BP 118/79 | HR 66 | Temp 98.3°F | Wt 255.4 lb

## 2021-09-23 DIAGNOSIS — R609 Edema, unspecified: Secondary | ICD-10-CM

## 2021-09-23 LAB — POCT URINALYSIS DIPSTICK OB
Bilirubin, UA: NEGATIVE
Blood, UA: NEGATIVE
Glucose, UA: NEGATIVE
Ketones, UA: NEGATIVE
Leukocytes, UA: NEGATIVE
Nitrite, UA: NEGATIVE
POC,PROTEIN,UA: NEGATIVE
Spec Grav, UA: 1.02 (ref 1.010–1.025)
Urobilinogen, UA: 0.2 E.U./dL
pH, UA: 6.5 (ref 5.0–8.0)

## 2021-09-23 MED ORDER — FUROSEMIDE 40 MG PO TABS: 40.0000 mg | ORAL_TABLET | Freq: Every day | ORAL | 0 refills | Status: DC

## 2021-09-23 MED ORDER — POTASSIUM CHLORIDE ER 10 MEQ PO TBCR: 20.0000 meq | EXTENDED_RELEASE_TABLET | Freq: Every day | ORAL | 0 refills | Status: DC

## 2021-09-23 NOTE — Patient Instructions (Signed)
Labs today.  I have increased the lasix.  If continues, will need further work up.  Take care  Dr. Lacinda Axon

## 2021-09-23 NOTE — Progress Notes (Signed)
Subjective:  Patient ID: Kimberly Becker, female    DOB: 1980-09-10  Age: 41 y.o. MRN: [id number]  CC: Chief Complaint  Patient presents with   Edema    Legs, ankles, knees, and hands began to swell about a week ago. Pt unable to walk due swelling and become painful. No salt, no pork, no soda-watching sodium intake.     HPI:  41 year old female with HTN, Migraine, GERD, Type 1 Diabetes, HLD, Morbid obesity presents for evaluation of edema.   Patient reports a 1 week history of swelling of the feet and ankles as well as her hands and fingers.  Recent travel.  Also reports that she was drinking a lot of soda recently.  She has now returned to drinking water and has eliminated foods that are high in salt.  However, swelling continues to persist and is quite troublesome for her.  She states that she cannot get all of her rings off of her hands because they are so swollen.  She has pain in her lower extremities due to the swelling.  Denies shortness of breath.  Patient reports that she is taking 20 mg of Lasix and it is not helping either.  No medication changes.  No other associated symptoms.  No other complaints.  Patient Active Problem List   Diagnosis Date Noted   Elevated LFTs 07/07/2021   Abnormal Papanicolaou smear of cervix with positive human papilloma virus (HPV) test 05/13/2021   Carpal tunnel syndrome 01/23/2021   Hirsutism 12/21/2020   Edema 12/21/2020   Gastroesophageal reflux disease without esophagitis 09/19/2020   Hypertension associated with diabetes ([place]) 03/22/2020   Diabetic neuropathy, painful ([place]) 02/14/2015   Type 1 diabetes mellitus ([place]) 09/26/2014   Anxiety 06/26/2013   Hyperlipidemia 03/20/2013   Morbid obesity ([place]) 11/08/2012   Migraines 08/10/2012    Social Hx   Social History   Socioeconomic History   Marital status: Married    Spouse name: Not on file   Number of children: Not on file   Years of education: 14   Highest education level: Not on  file  Occupational History   Occupation: Print production planner  Tobacco Use   Smoking status: Never   Smokeless tobacco: Never  Vaping Use   Vaping Use: Never used  Substance and Sexual Activity   Alcohol use: No   Drug use: No   Sexual activity: Yes    Comment: heterosexual relationship  Other Topics Concern   Not on file  Social History Narrative   Not on file   Social Determinants of Health   Financial Resource Strain: Low Risk  (05/07/2021)   Overall Financial Resource Strain (CARDIA)    Difficulty of Paying Living Expenses: Not hard at all  Food Insecurity: No Food Insecurity (05/07/2021)   Hunger Vital Sign    Worried About Running Out of Food in the Last Year: Never true    Ran Out of Food in the Last Year: Never true  Transportation Needs: No Transportation Needs (05/07/2021)   PRAPARE - Hydrologist (Medical): No    Lack of Transportation (Non-Medical): No  Physical Activity: Insufficiently Active (05/07/2021)   Exercise Vital Sign    Days of Exercise per Week: 2 days    Minutes of Exercise per Session: 20 min  Stress: No Stress Concern Present (05/07/2021)   [place]    Feeling of Stress : Only a little  Social  Connections: Unknown (05/07/2021)   Social Connection and Isolation Panel [NHANES]    Frequency of Communication with Friends and Family: More than three times a week    Frequency of Social Gatherings with Friends and Family: Once a week    Attends Religious Services: Patient refused    Marine scientist or Organizations: Patient refused    Attends Music therapist: Patient refused    Marital Status: Married    Review of Systems Per HPI  Objective:  BP 118/79   Pulse 66   Temp 98.3 F (36.8 C)   Wt 255 lb 6.4 oz (115.8 kg)   SpO2 98%   BMI 45.24 kg/m      09/23/2021    2:39 PM 08/19/2021   11:51 AM 08/01/2021    8:54 AM  BP/Weight   Systolic BP 245 809 983  Diastolic BP 79 64 72  Wt. (Lbs) 255.4 251 249  BMI 45.24 kg/m2 44.46 kg/m2 44.11 kg/m2    Physical Exam Constitutional:      Appearance: Normal appearance. She is obese.  HENT:     Head: Normocephalic and atraumatic.  Cardiovascular:     Rate and Rhythm: Normal rate and regular rhythm.  Pulmonary:     Effort: Pulmonary effort is normal.     Breath sounds: Normal breath sounds. No wheezing, rhonchi or rales.  Skin:    Comments: Trace to 1+ edema of the lower extremities.  Swelling noted of the digits of the hands.  Neurological:     Mental Status: She is alert.  Psychiatric:        Mood and Affect: Mood normal.        Behavior: Behavior normal.     Lab Results  Component Value Date   WBC 11.5 (H) 12/20/2020   HGB 13.4 12/20/2020   HCT 40.1 12/20/2020   PLT 361 12/20/2020   GLUCOSE 182 (H) 08/04/2021   CHOL 187 06/30/2021   TRIG 118 06/30/2021   HDL 69 06/30/2021   LDLCALC 97 06/30/2021   ALT 52 (H) 08/04/2021   AST 34 08/04/2021   NA 139 08/04/2021   K 4.4 08/04/2021   CL 97 08/04/2021   CREATININE 1.01 (H) 08/04/2021   BUN 21 08/04/2021   CO2 27 08/04/2021   TSH 1.810 08/19/2021   HGBA1C 6.1 (H) 08/04/2021   MICROALBUR 0.2 05/18/2014     Assessment & Plan:   Problem List Items Addressed This Visit       Other   Edema - Primary    Urinalysis was negative for protein.  Further work-up today with CMP and BNP.  Increasing Lasix to 40 mg daily.  Increasing potassium as well.      Relevant Orders   CMP14+EGFR   Brain natriuretic peptide   POC Urinalysis Dipstick OB (Completed)    Meds ordered this encounter  Medications   furosemide (LASIX) 40 MG tablet    Sig: Take 1 tablet (40 mg total) by mouth daily.    Dispense:  90 tablet    Refill:  0   potassium chloride (KLOR-CON) 10 MEQ tablet    Sig: Take 2 tablets (20 mEq total) by mouth daily.    Dispense:  180 tablet    Refill:  [place]

## 2021-09-23 NOTE — Assessment & Plan Note (Signed)
Urinalysis was negative for protein.  Further work-up today with CMP and BNP.  Increasing Lasix to 40 mg daily.  Increasing potassium as well.

## 2021-09-24 ENCOUNTER — Encounter: Payer: Self-pay | Admitting: Family Medicine

## 2021-09-24 LAB — CMP14+EGFR
ALT: 60 IU/L — ABNORMAL HIGH (ref 0–32)
AST: 52 IU/L — ABNORMAL HIGH (ref 0–40)
Albumin/Globulin Ratio: 1.6 (ref 1.2–2.2)
Albumin: 4.3 g/dL (ref 3.8–4.8)
Alkaline Phosphatase: 96 IU/L (ref 44–121)
BUN/Creatinine Ratio: 21 (ref 9–23)
BUN: 16 mg/dL (ref 6–24)
Bilirubin Total: 0.4 mg/dL (ref 0.0–1.2)
CO2: 24 mmol/L (ref 20–29)
Calcium: 9.3 mg/dL (ref 8.7–10.2)
Chloride: 99 mmol/L (ref 96–106)
Creatinine, Ser: 0.75 mg/dL (ref 0.57–1.00)
Globulin, Total: 2.7 g/dL (ref 1.5–4.5)
Glucose: 227 mg/dL — ABNORMAL HIGH (ref 70–99)
Potassium: 4.5 mmol/L (ref 3.5–5.2)
Sodium: 140 mmol/L (ref 134–144)
Total Protein: 7 g/dL (ref 6.0–8.5)
eGFR: 102 mL/min/{1.73_m2} (ref >59)

## 2021-09-24 LAB — BRAIN NATRIURETIC PEPTIDE: BNP: 18.3 pg/mL (ref 0.0–100.0)

## 2021-10-21 ENCOUNTER — Other Ambulatory Visit: Payer: Self-pay | Admitting: Family Medicine

## 2021-10-31 ENCOUNTER — Encounter: Payer: Self-pay | Admitting: Family Medicine

## 2021-10-31 ENCOUNTER — Ambulatory Visit (INDEPENDENT_AMBULATORY_CARE_PROVIDER_SITE_OTHER): Payer: 59 | Admitting: Family Medicine

## 2021-10-31 DIAGNOSIS — E1159 Type 2 diabetes mellitus with other circulatory complications: Secondary | ICD-10-CM

## 2021-10-31 DIAGNOSIS — E785 Hyperlipidemia, unspecified: Secondary | ICD-10-CM | POA: Diagnosis not present

## 2021-10-31 DIAGNOSIS — I152 Hypertension secondary to endocrine disorders: Secondary | ICD-10-CM | POA: Diagnosis not present

## 2021-10-31 DIAGNOSIS — E1069 Type 1 diabetes mellitus with other specified complication: Secondary | ICD-10-CM | POA: Diagnosis not present

## 2021-10-31 NOTE — Patient Instructions (Addendum)
I have placed a referral to nutrition.  Follow up in 3-6 months.  Take care  Dr. Adriana Simas

## 2021-10-31 NOTE — Progress Notes (Signed)
Subjective:  Patient ID: Kimberly Becker, female    DOB: 01/03/1981  Age: 41 y.o. MRN: [id number]  CC: Chief Complaint  Patient presents with   Diabetes    Pt still having swelling (pt states it is possibly the heat). Pt is not losing weight-was [facility] but they state it is another 3 month wait list. They never called pt and pt contacted them last week. Pt is going through menopause. Pt is exercising and eating better.     HPI:  41 year old female presents for follow-up.  Hypertension is well controlled.  Lipids have been stable on Crestor.  Diabetes has been at goal.  Last A1c 6.1.  Patient reports that her edema has been improved per tickly when she is indoors.  She finds it when she is outside a lot in the heat that her edema worsens.  Patient is interested in weight loss and is having difficulty losing weight.  She has made some dietary changes and is trying to stay active.  She has still not lost any weight.  She would like to discuss this today.  Patient Active Problem List   Diagnosis Date Noted   Elevated LFTs 07/07/2021   Abnormal Papanicolaou smear of cervix with positive human papilloma virus (HPV) test 05/13/2021   Carpal tunnel syndrome 01/23/2021   Hirsutism 12/21/2020   Lower extremity edema 12/21/2020   Gastroesophageal reflux disease without esophagitis 09/19/2020   Hypertension associated with diabetes (HCC) 03/22/2020   Type 1 diabetes mellitus (HCC) 09/26/2014   Anxiety 06/26/2013   Hyperlipidemia 03/20/2013   Morbid obesity (HCC) 11/08/2012   Migraines 08/10/2012    Social Hx   Social History   Socioeconomic History   Marital status: Married    Spouse name: Not on file   Number of children: Not on file   Years of education: 14   Highest education level: Not on file  Occupational History   Occupation: Manufacturing systems engineer  Tobacco Use   Smoking status: Never   Smokeless tobacco: Never  Vaping Use   Vaping Use: Never used   Substance and Sexual Activity   Alcohol use: No   Drug use: No   Sexual activity: Yes    Comment: heterosexual relationship  Other Topics Concern   Not on file  Social History Narrative   Not on file   Social Determinants of Health   Financial Resource Strain: Low Risk  (05/07/2021)   Overall Financial Resource Strain (CARDIA)    Difficulty of Paying Living Expenses: Not hard at all  Food Insecurity: No Food Insecurity (05/07/2021)   Hunger Vital Sign    Worried About Running Out of Food in the Last Year: Never true    Ran Out of Food in the Last Year: Never true  Transportation Needs: No Transportation Needs (05/07/2021)   PRAPARE - Administrator, Civil Service (Medical): No    Lack of Transportation (Non-Medical): No  Physical Activity: Insufficiently Active (05/07/2021)   Exercise Vital Sign    Days of Exercise per Week: 2 days    Minutes of Exercise per Session: 20 min  Stress: No Stress Concern Present (05/07/2021)   [employer] of Occupational Health - Occupational Stress Questionnaire    Feeling of Stress : Only a little  Social Connections: Unknown (05/07/2021)   Social Connection and Isolation Panel [NHANES]    Frequency of Communication with Friends and Family: More than three times a week    Frequency of Social Gatherings  with Friends and Family: Once a week    Attends Religious Services: Patient refused    Active Member of Clubs or Organizations: Patient refused    Attends Banker Meetings: Patient refused    Marital Status: Married    Review of Systems Per HPI  Objective:  BP 111/75   Pulse 75   Temp 98.1 F (36.7 C)   Wt 254 lb (115.2 kg)   SpO2 99%   BMI 44.99 kg/m      10/31/2021    9:29 AM 09/23/2021    2:39 PM 08/19/2021   11:51 AM  BP/Weight  Systolic BP 111 118 110  Diastolic BP 75 79 64  Wt. (Lbs) 254 255.4 251  BMI 44.99 kg/m2 45.24 kg/m2 44.46 kg/m2    Physical Exam Vitals and nursing note reviewed.   Constitutional:      Appearance: Normal appearance. She is obese.  Cardiovascular:     Rate and Rhythm: Normal rate and regular rhythm.     Comments: No significant lower extremity edema. Pulmonary:     Effort: Pulmonary effort is normal.     Breath sounds: No wheezing, rhonchi or rales.  Neurological:     Mental Status: She is alert.  Psychiatric:        Mood and Affect: Mood normal.        Behavior: Behavior normal.     Lab Results  Component Value Date   WBC 11.5 (H) 12/20/2020   HGB 13.4 12/20/2020   HCT 40.1 12/20/2020   PLT 361 12/20/2020   GLUCOSE 227 (H) 09/23/2021   CHOL 187 06/30/2021   TRIG 118 06/30/2021   HDL 69 06/30/2021   LDLCALC 97 06/30/2021   ALT 60 (H) 09/23/2021   AST 52 (H) 09/23/2021   NA 140 09/23/2021   K 4.5 09/23/2021   CL 99 09/23/2021   CREATININE 0.75 09/23/2021   BUN 16 09/23/2021   CO2 24 09/23/2021   TSH 1.810 08/19/2021   HGBA1C 6.1 (H) 08/04/2021   MICROALBUR 0.2 05/18/2014     Assessment & Plan:   Problem List Items Addressed This Visit       Cardiovascular and Mediastinum   Hypertension associated with diabetes (HCC)    Hypertension is at goal.  Continue current medications.        Endocrine   Type 1 diabetes mellitus (HCC)    A1c at goal.  Continue current dosing of insulin.      Relevant Orders   Amb ref to Medical Nutrition Therapy-MNT     Other   Hyperlipidemia    Stable.  Continue Crestor.      Morbid obesity (HCC) - Primary    We had a lengthy discussion today regarding diet.  We discussed need for caloric deficit.  She is awaiting an appointment with at the weight and wellness.  Placing referral to nutrition.      Relevant Orders   Amb ref to Medical Nutrition Therapy-MNT    Everlene Other DO [facility]

## 2021-10-31 NOTE — Assessment & Plan Note (Signed)
Improved/resolved

## 2021-10-31 NOTE — Assessment & Plan Note (Signed)
Hypertension is at goal.  Continue current medications. 

## 2021-10-31 NOTE — Assessment & Plan Note (Signed)
Stable. Continue Crestor. 

## 2021-10-31 NOTE — Assessment & Plan Note (Addendum)
A1c at goal.  Continue current dosing of insulin.

## 2021-10-31 NOTE — Assessment & Plan Note (Signed)
We had a lengthy discussion today regarding diet.  We discussed need for caloric deficit.  She is awaiting an appointment with at the weight and wellness.  Placing referral to nutrition.

## 2021-11-17 ENCOUNTER — Encounter: Payer: Self-pay | Admitting: Family Medicine

## 2021-11-17 MED ORDER — INSULIN ASPART 100 UNIT/ML FLEXPEN
PEN_INJECTOR | SUBCUTANEOUS | 3 refills | Status: DC
Start: 2021-11-17 — End: 2022-02-13

## 2021-11-28 ENCOUNTER — Other Ambulatory Visit: Payer: Self-pay | Admitting: Family Medicine

## 2021-12-03 ENCOUNTER — Other Ambulatory Visit: Payer: Self-pay

## 2021-12-03 ENCOUNTER — Encounter: Payer: Self-pay | Admitting: Family Medicine

## 2021-12-03 ENCOUNTER — Encounter: Payer: 59 | Admitting: Nutrition

## 2021-12-03 MED ORDER — TRIAMCINOLONE ACETONIDE 0.1 % EX CREA: 1.0000 | TOPICAL_CREAM | Freq: Two times a day (BID) | CUTANEOUS | 0 refills | Status: DC

## 2021-12-13 ENCOUNTER — Other Ambulatory Visit: Payer: Self-pay | Admitting: Family Medicine

## 2021-12-30 ENCOUNTER — Other Ambulatory Visit: Payer: Self-pay | Admitting: Nurse Practitioner

## 2021-12-30 ENCOUNTER — Encounter: Payer: Self-pay | Admitting: Family Medicine

## 2021-12-30 MED ORDER — DEXCOM G6 TRANSMITTER MISC: 0 refills | Status: DC

## 2022-01-25 ENCOUNTER — Other Ambulatory Visit: Payer: Self-pay | Admitting: Family Medicine

## 2022-01-29 ENCOUNTER — Ambulatory Visit: Payer: 59 | Admitting: Adult Health

## 2022-01-29 ENCOUNTER — Encounter: Payer: Self-pay | Admitting: Adult Health

## 2022-01-29 VITALS — BP 118/72 | HR 69 | Ht 63.0 in | Wt 247.5 lb

## 2022-01-29 DIAGNOSIS — N951 Menopausal and female climacteric states: Secondary | ICD-10-CM

## 2022-01-29 DIAGNOSIS — N926 Irregular menstruation, unspecified: Secondary | ICD-10-CM | POA: Diagnosis not present

## 2022-01-29 DIAGNOSIS — N946 Dysmenorrhea, unspecified: Secondary | ICD-10-CM

## 2022-01-29 DIAGNOSIS — Z3202 Encounter for pregnancy test, result negative: Secondary | ICD-10-CM | POA: Diagnosis not present

## 2022-01-29 LAB — POCT URINE PREGNANCY: Preg Test, Ur: NEGATIVE

## 2022-01-29 NOTE — Progress Notes (Signed)
  Subjective:     Patient ID: Kimberly Becker, female   DOB: 1980-11-06, 41 y.o.   MRN: [id number]  HPI Kimberly Becker is a 41 year old white female,married, G2P0020 in complaining of Missed period for 8 months, had 3 periods in September and bleed 11 days in October.   Bad cramps with last period, like in high school  Last pap was negative malignancy and +HPV 05/07/21  PCP is Dr Lacinda Axon.  Review of Systems Missed period for 8 months, had 3 periods in September and bleed 11 days in October   Bad cramps with last period, like in high school Reviewed past medical,surgical, social and family history. Reviewed medications and allergies.  Objective:   Physical Exam BP 118/72 (BP Location: Left Arm, Patient Position: Sitting, Cuff Size: Normal)   Pulse 69   Ht 5\' 3"  (1.6 m)   Wt 247 lb 8 oz (112.3 kg)   LMP 01/13/2022   BMI 43.84 kg/m  UPT is negative    Skin warm and dry.Pelvic: external genitalia is normal in appearance no lesions, vagina: pink,urethra has no lesions or masses noted, cervix:smooth and bulbous, uterus: normal size, shape and contour, non tender, no masses felt, adnexa: no masses or tenderness noted. Bladder is non tender and no masses felt  Upstream - 01/29/22 1209       Pregnancy Intention Screening   Does the patient want to become pregnant in the next year? Yes    Does the patient's partner want to become pregnant in the next year? Yes    Would the patient like to discuss contraceptive options today? Yes      Contraception Wrap Up   Current Method Pregnant/Seeking Pregnancy    End Method No Method - Other Reason    Contraception Counseling Provided No            Examination chaperoned by Levy Pupa LPN  Assessment:     1. Pregnancy examination or test, negative result  2. Menstrual periods irregular Missed period for 8 months, had 3 periods in September and bleed 11 days in October Will get pelvic US at [facility] 02/06/22 at 9:30 am to assess uterus and  ovaries  3. Dysmenorrhea Bad cramps with last period, like in high school  4. Perimenopause FSH was 40.8 on 08/19/21    Plan:     Follow up prn Will talk when [place] results back

## 2022-01-30 IMAGING — MG MM DIGITAL SCREENING BILAT W/ TOMO AND CAD
6 of 12 series · 6 of 36 positions shown · non-contrast
Comparison: Previous exam(s).

CLINICAL DATA: Screening.

EXAM:
DIGITAL SCREENING BILATERAL MAMMOGRAM WITH TOMOSYNTHESIS AND CAD
TECHNIQUE: Bilateral screening digital craniocaudal and mediolateral oblique
mammograms were obtained. Bilateral screening digital breast
tomosynthesis was performed. The images were evaluated with
computer-aided detection.

[R MLO synth-2D]
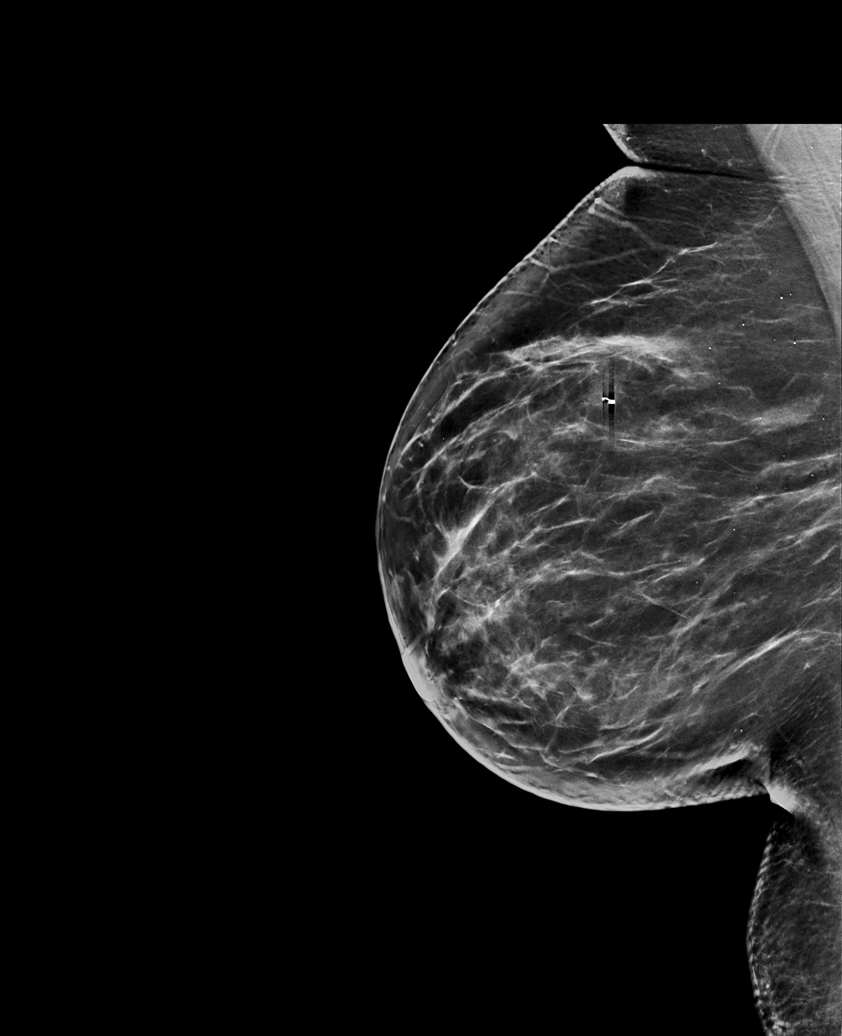

[L XCCL synth-2D]
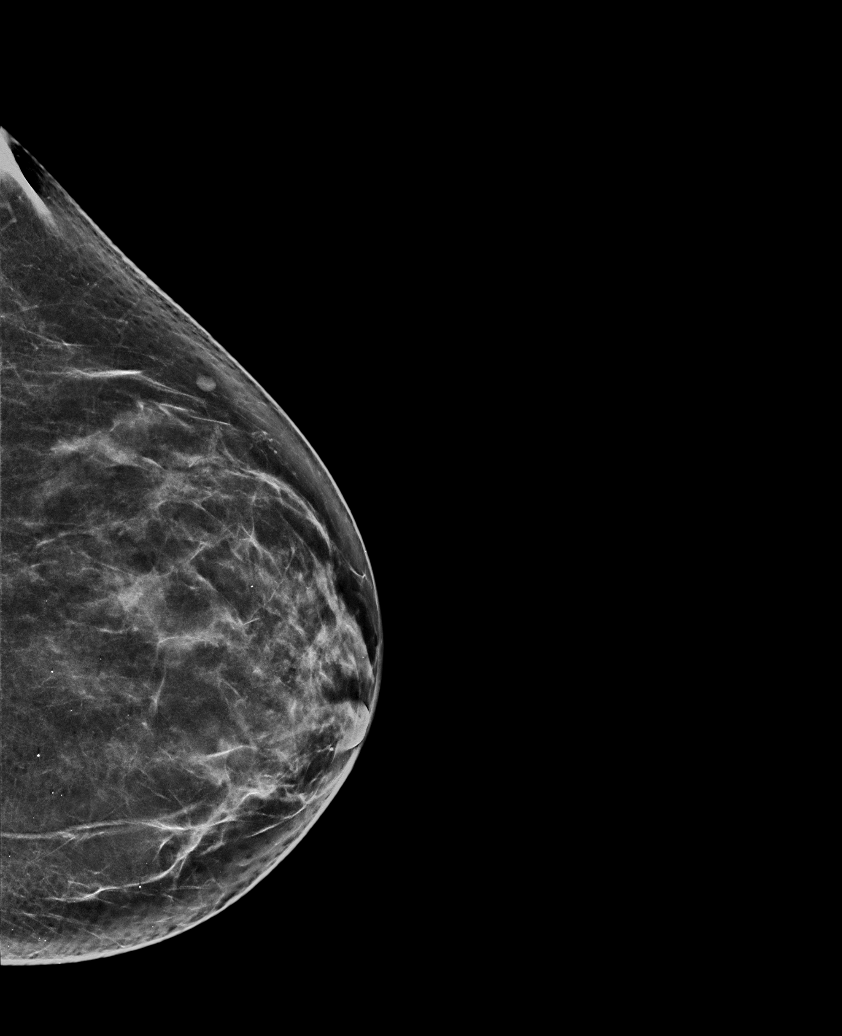

[R CC synth-2D (1 of 2)]
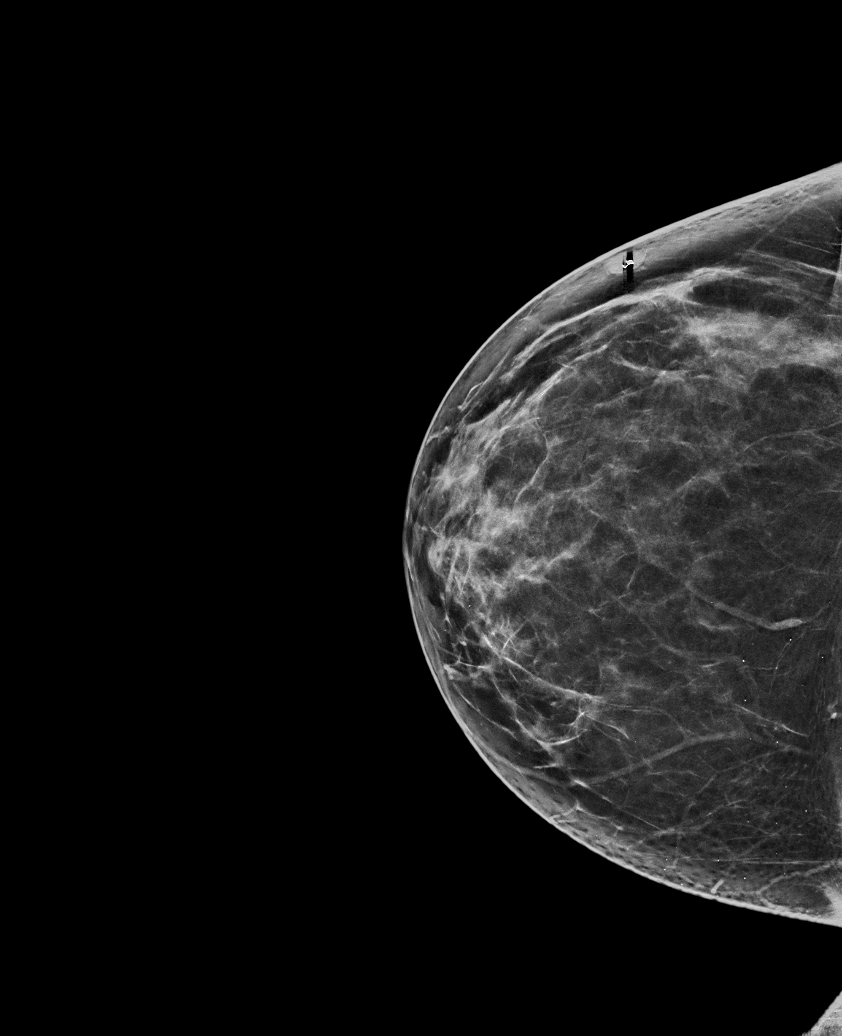

[L CC synth-2D]
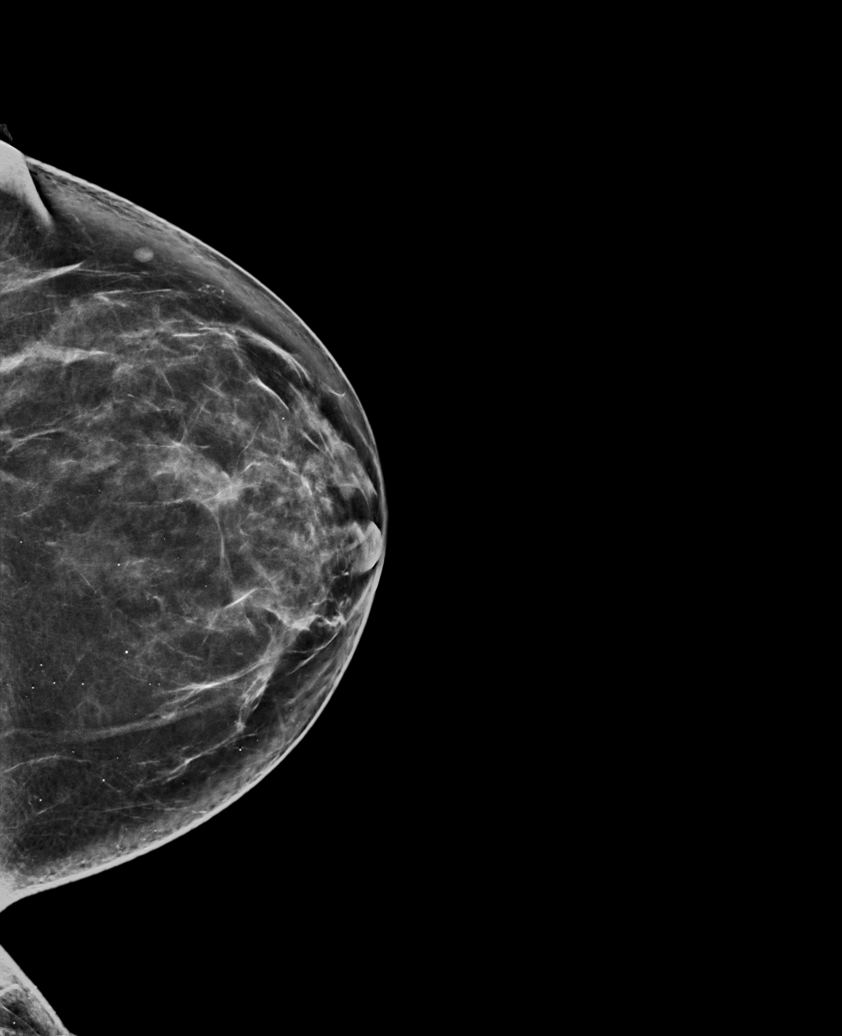

[R CC synth-2D (2 of 2)]
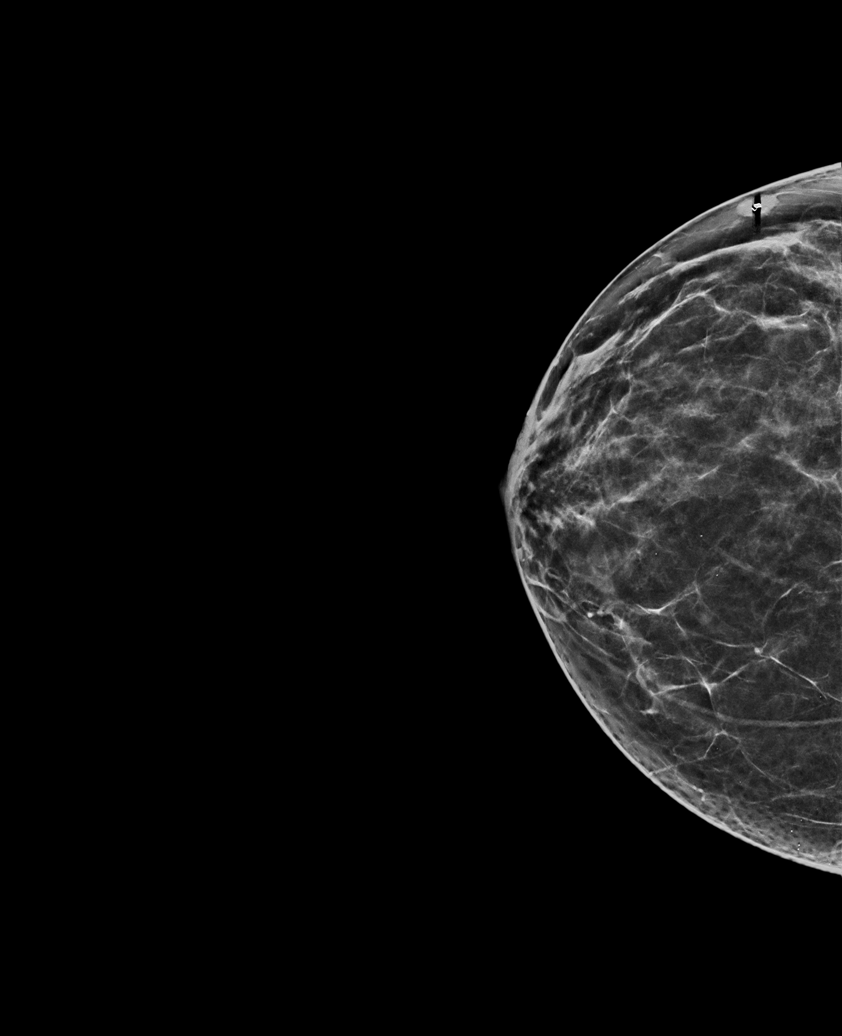

[L MLO synth-2D]
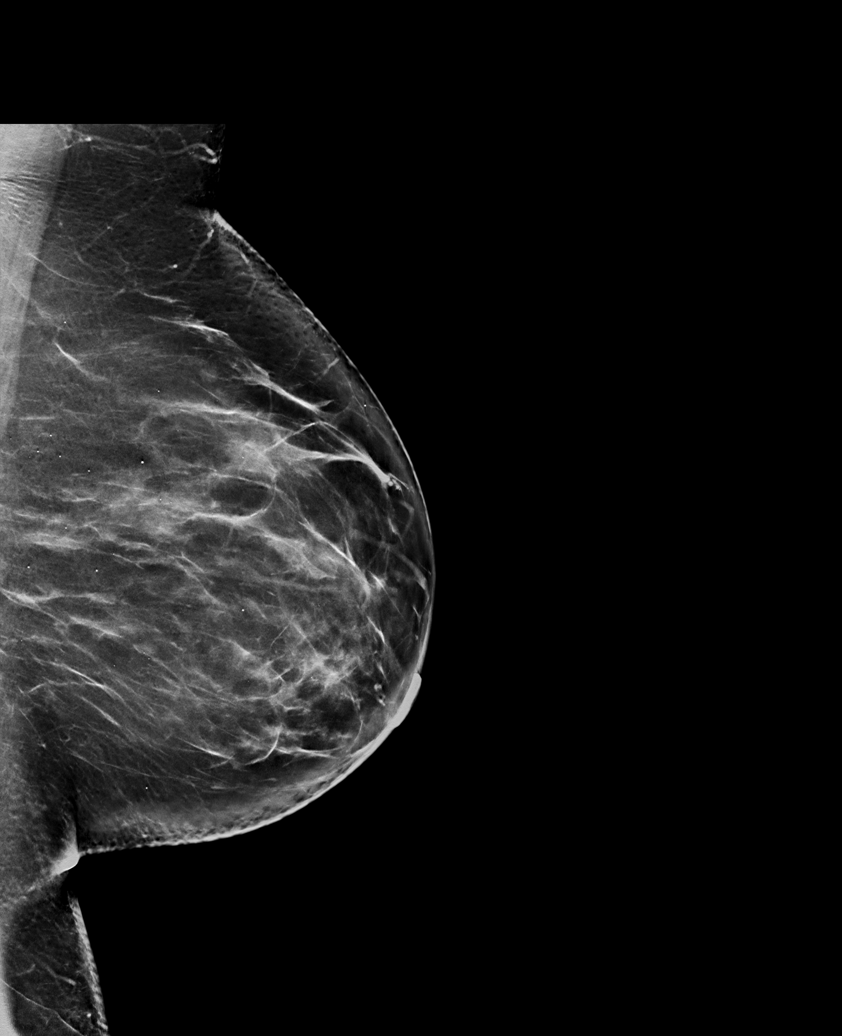

[6 of 36 positions shown; findings below may reference images not displayed]

ACR Breast Density Category b: There are scattered areas of
fibroglandular density.
FINDINGS: There are no findings suspicious for malignancy. The images were
evaluated with computer-aided detection.
IMPRESSION: No mammographic evidence of malignancy. A result letter of this
screening mammogram will be mailed directly to the patient.

RECOMMENDATION:
Screening mammogram in one year. (Code:[id number])

BI-RADS CATEGORY  1: Negative.

## 2022-02-02 ENCOUNTER — Ambulatory Visit: Payer: 59 | Admitting: Nutrition

## 2022-02-06 ENCOUNTER — Ambulatory Visit (HOSPITAL_COMMUNITY): Payer: 59

## 2022-02-13 ENCOUNTER — Other Ambulatory Visit: Payer: Self-pay | Admitting: Nurse Practitioner

## 2022-02-13 ENCOUNTER — Encounter: Payer: Self-pay | Admitting: Family Medicine

## 2022-02-13 MED ORDER — INSULIN ASPART 100 UNIT/ML FLEXPEN: PEN_INJECTOR | SUBCUTANEOUS | 3 refills | Status: DC

## 2022-02-28 ENCOUNTER — Encounter: Payer: Self-pay | Admitting: Family Medicine

## 2022-02-28 ENCOUNTER — Other Ambulatory Visit: Payer: Self-pay | Admitting: Family Medicine

## 2022-03-02 MED ORDER — POTASSIUM CHLORIDE ER 10 MEQ PO TBCR: 20.0000 meq | EXTENDED_RELEASE_TABLET | Freq: Every day | ORAL | 0 refills | Status: DC

## 2022-03-02 MED ORDER — VALSARTAN 40 MG PO TABS: ORAL_TABLET | ORAL | 0 refills | Status: DC

## 2022-03-02 MED ORDER — FUROSEMIDE 40 MG PO TABS: 40.0000 mg | ORAL_TABLET | Freq: Every day | ORAL | 0 refills | Status: DC

## 2022-03-23 ENCOUNTER — Encounter: Payer: Self-pay | Admitting: Family Medicine

## 2022-03-23 ENCOUNTER — Other Ambulatory Visit: Payer: Self-pay | Admitting: Family Medicine

## 2022-03-28 ENCOUNTER — Encounter: Payer: Self-pay | Admitting: Family Medicine

## 2022-03-30 ENCOUNTER — Other Ambulatory Visit: Payer: Self-pay | Admitting: Family Medicine

## 2022-03-30 MED ORDER — TRIAMCINOLONE ACETONIDE 0.5 % EX OINT: 1.0000 | TOPICAL_OINTMENT | Freq: Two times a day (BID) | CUTANEOUS | 0 refills | Status: DC

## 2022-04-27 ENCOUNTER — Telehealth: Payer: 59 | Admitting: Family Medicine

## 2022-05-01 ENCOUNTER — Other Ambulatory Visit: Payer: Self-pay | Admitting: Family Medicine

## 2022-05-04 ENCOUNTER — Ambulatory Visit (INDEPENDENT_AMBULATORY_CARE_PROVIDER_SITE_OTHER): Payer: 59 | Admitting: Family Medicine

## 2022-05-04 ENCOUNTER — Encounter: Payer: Self-pay | Admitting: Family Medicine

## 2022-05-04 DIAGNOSIS — I10 Essential (primary) hypertension: Secondary | ICD-10-CM

## 2022-05-04 DIAGNOSIS — Z130 Encounter for screening for diseases of the blood and blood-forming organs and certain disorders involving the immune mechanism: Secondary | ICD-10-CM

## 2022-05-04 DIAGNOSIS — E1069 Type 1 diabetes mellitus with other specified complication: Secondary | ICD-10-CM

## 2022-05-04 DIAGNOSIS — E785 Hyperlipidemia, unspecified: Secondary | ICD-10-CM

## 2022-05-04 NOTE — Patient Instructions (Signed)
Labs at your convenience.  Follow up in 3 months.  Take care  Dr. Lacinda Axon

## 2022-05-05 DIAGNOSIS — I10 Essential (primary) hypertension: Secondary | ICD-10-CM | POA: Insufficient documentation

## 2022-05-05 MED ORDER — ROSUVASTATIN CALCIUM 10 MG PO TABS: 10.0000 mg | ORAL_TABLET | Freq: Every day | ORAL | 3 refills | Status: DC

## 2022-05-05 NOTE — Assessment & Plan Note (Signed)
Well-controlled on valsartan and Lasix.

## 2022-05-05 NOTE — Progress Notes (Signed)
Subjective:  Patient ID: Kimberly Becker, female    DOB: 1980-05-27  Age: 42 y.o. MRN: [id number]  CC: Chief Complaint  Patient presents with   Diabetes    Pt arrives for follow up on DM and HTN. Sugars elevated during holidays. Taking Lasix due swelling. HTN stable. Look at ears and make sure infection is clear (went to Urgent Care 2 weeks ago due to strep throat)    HPI:  42 year old female with type 1 diabetes, hypertension, GERD, morbid obesity, hyperlipidemia presents for follow-up.  Patient is overdue for labs.  Patient reports that her blood sugar readings have been higher due to dietary discretion over the holidays.  No reports of hypoglycemia.  Overdue for A1c.  Last A1c 6.1.  Hypertension is stable on valsartan and Lasix.  Needs reassessment of lipids.  She is compliant with Crestor.  Needs refill.  Patient states that she was recently seen in urgent care for strep throat.  She has had some ear discomfort.  Would Becker me to examine her ears today.  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 05/13/2021   Lower extremity edema 12/21/2020   Gastroesophageal reflux disease without esophagitis 09/19/2020   Menstrual periods irregular 01/16/2020   Type 1 diabetes mellitus ([place]) 09/26/2014   Anxiety 06/26/2013   Hyperlipidemia 03/20/2013   Morbid obesity ([place]) 11/08/2012   Migraines 08/10/2012    Social Hx   Social History   Socioeconomic History   Marital status: Married    Spouse name: Not on file   Number of children: Not on file   Years of education: 14   Highest education level: Not on file  Occupational History   Occupation: Print production planner  Tobacco Use   Smoking status: Never   Smokeless tobacco: Never  Vaping Use   Vaping Use: Never used  Substance and Sexual Activity   Alcohol use: No   Drug use: No   Sexual  activity: Yes    Birth control/protection: None    Comment: heterosexual relationship  Other Topics Concern   Not on file  Social History Narrative   Not on file   Social Determinants of Health   Financial Resource Strain: Low Risk  (05/07/2021)   Overall Financial Resource Strain (CARDIA)    Difficulty of Paying Living Expenses: Not hard at all  Food Insecurity: No Food Insecurity (05/07/2021)   Hunger Vital Sign    Worried About Running Out of Food in the Last Year: Never true    Ran Out of Food in the Last Year: Never true  Transportation Needs: No Transportation Needs (05/07/2021)   PRAPARE - Hydrologist (Medical): No    Lack of Transportation (Non-Medical): No  Physical Activity: Insufficiently Active (05/07/2021)   Exercise Vital Sign    Days of Exercise per Week: 2 days    Minutes of Exercise per Session: 20 min  Stress: No Stress Concern Present (05/07/2021)   [place]    Feeling of Stress : Only a little  Social Connections: Unknown (05/07/2021)   Social Connection and Isolation Panel [NHANES]    Frequency of Communication with Friends and Family: More than three times a week    Frequency of Social Gatherings with Friends and Family: Once a week    Attends Religious Services: Patient refused  Active Member of Clubs or Organizations: Patient refused    Attends Archivist Meetings: Patient refused    Marital Status: Married    Review of Systems Per HPI  Objective:  BP 100/65   Pulse 72   Temp (!) 97.2 F (36.2 C)   Wt 248 lb 6.4 oz (112.7 kg)   SpO2 98%   BMI 44.00 kg/m      05/04/2022    3:15 PM 01/29/2022   12:04 PM 10/31/2021    9:29 AM  BP/Weight  Systolic BP 182 993 716  Diastolic BP 65 72 75  Wt. (Lbs) 248.4 247.5 254  BMI 44 kg/m2 43.84 kg/m2 44.99 kg/m2    Physical Exam Vitals and nursing note reviewed.  Constitutional:      General: She  is not in acute distress.    Appearance: Normal appearance. She is obese.  HENT:     Head: Normocephalic and atraumatic.     Right Ear: Tympanic membrane normal.     Left Ear: Tympanic membrane normal.  Cardiovascular:     Rate and Rhythm: Normal rate and regular rhythm.  Pulmonary:     Effort: Pulmonary effort is normal.     Breath sounds: Normal breath sounds. No wheezing, rhonchi or rales.  Neurological:     Mental Status: She is alert.  Psychiatric:        Mood and Affect: Mood normal.        Behavior: Behavior normal.     Lab Results  Component Value Date   WBC 11.5 (H) 12/20/2020   HGB 13.4 12/20/2020   HCT 40.1 12/20/2020   PLT 361 12/20/2020   GLUCOSE 227 (H) 09/23/2021   CHOL 187 06/30/2021   TRIG 118 06/30/2021   HDL 69 06/30/2021   LDLCALC 97 06/30/2021   ALT 60 (H) 09/23/2021   AST 52 (H) 09/23/2021   NA 140 09/23/2021   K 4.5 09/23/2021   CL 99 09/23/2021   CREATININE 0.75 09/23/2021   BUN 16 09/23/2021   CO2 24 09/23/2021   TSH 1.810 08/19/2021   HGBA1C 6.1 (H) 08/04/2021   MICROALBUR 0.2 05/18/2014     Assessment & Plan:   Problem List Items Addressed This Visit       Cardiovascular and Mediastinum   Essential hypertension    Well-controlled on valsartan and Lasix.      Relevant Medications   rosuvastatin (CRESTOR) 10 MG tablet     Endocrine   Type 1 diabetes mellitus (HCC)    A1c today to reassess.  Continue current insulin dosing.      Relevant Medications   rosuvastatin (CRESTOR) 10 MG tablet   Other Relevant Orders   CMP14+EGFR   Hemoglobin A1c   Microalbumin / creatinine urine ratio     Other   Hyperlipidemia    Lipid panel today.  Continue Crestor.  Crestor refilled.      Relevant Medications   rosuvastatin (CRESTOR) 10 MG tablet   Other Relevant Orders   Lipid panel   Other Visit Diagnoses     Screening for deficiency anemia       Relevant Orders   CBC       Meds ordered this encounter  Medications    rosuvastatin (CRESTOR) 10 MG tablet    Sig: Take 1 tablet (10 mg total) by mouth daily.    Dispense:  90 tablet    Refill:  3    Follow-up:  3 months  Taaj Hurlbut DO [facility]

## 2022-05-05 NOTE — Assessment & Plan Note (Signed)
Lipid panel today.  Continue Crestor.  Crestor refilled.

## 2022-05-05 NOTE — Assessment & Plan Note (Signed)
A1c today to reassess.  Continue current insulin dosing.

## 2022-05-13 DIAGNOSIS — Z130 Encounter for screening for diseases of the blood and blood-forming organs and certain disorders involving the immune mechanism: Secondary | ICD-10-CM | POA: Diagnosis not present

## 2022-05-13 DIAGNOSIS — E785 Hyperlipidemia, unspecified: Secondary | ICD-10-CM | POA: Diagnosis not present

## 2022-05-13 DIAGNOSIS — E1069 Type 1 diabetes mellitus with other specified complication: Secondary | ICD-10-CM | POA: Diagnosis not present

## 2022-05-14 ENCOUNTER — Encounter: Payer: Self-pay | Admitting: Family Medicine

## 2022-05-15 LAB — CBC
Hematocrit: 40.6 % (ref 34.0–46.6)
Hemoglobin: 13.3 g/dL (ref 11.1–15.9)
MCH: 29.8 pg (ref 26.6–33.0)
MCHC: 32.8 g/dL (ref 31.5–35.7)
MCV: 91 fL (ref 79–97)
Platelets: 329 10*3/uL (ref 150–450)
RBC: 4.47 x10E6/uL (ref 3.77–5.28)
RDW: 12.7 % (ref 11.7–15.4)
WBC: 10.7 10*3/uL (ref 3.4–10.8)

## 2022-05-15 LAB — CMP14+EGFR
ALT: 23 IU/L (ref 0–32)
AST: 22 IU/L (ref 0–40)
Albumin/Globulin Ratio: 1.7 (ref 1.2–2.2)
Albumin: 4.4 g/dL (ref 3.9–4.9)
Alkaline Phosphatase: 88 IU/L (ref 44–121)
BUN/Creatinine Ratio: 16 (ref 9–23)
BUN: 17 mg/dL (ref 6–24)
Bilirubin Total: 0.4 mg/dL (ref 0.0–1.2)
CO2: 22 mmol/L (ref 20–29)
Calcium: 8.9 mg/dL (ref 8.7–10.2)
Chloride: 98 mmol/L (ref 96–106)
Creatinine, Ser: 1.09 mg/dL — ABNORMAL HIGH (ref 0.57–1.00)
Globulin, Total: 2.6 g/dL (ref 1.5–4.5)
Glucose: 235 mg/dL — ABNORMAL HIGH (ref 70–99)
Potassium: 4.1 mmol/L (ref 3.5–5.2)
Sodium: 136 mmol/L (ref 134–144)
Total Protein: 7 g/dL (ref 6.0–8.5)
eGFR: 65 mL/min/{1.73_m2} (ref >59)

## 2022-05-15 LAB — HEMOGLOBIN A1C
Est. average glucose Bld gHb Est-mCnc: 143 mg/dL
Hgb A1c MFr Bld: 6.6 % — ABNORMAL HIGH (ref 4.8–5.6)

## 2022-05-15 LAB — LIPID PANEL
Chol/HDL Ratio: 2.8 ratio (ref 0.0–4.4)
Cholesterol, Total: 165 mg/dL (ref 100–199)
HDL: 59 mg/dL (ref >39)
LDL Chol Calc (NIH): 80 mg/dL (ref 0–99)
Triglycerides: 149 mg/dL (ref 0–149)
VLDL Cholesterol Cal: 26 mg/dL (ref 5–40)

## 2022-05-15 LAB — MICROALBUMIN / CREATININE URINE RATIO
Creatinine, Urine: 9.5 mg/dL
Microalbumin, Urine: <3 ug/mL

## 2022-05-17 ENCOUNTER — Other Ambulatory Visit: Payer: Self-pay | Admitting: Family Medicine

## 2022-05-17 ENCOUNTER — Encounter: Payer: Self-pay | Admitting: Family Medicine

## 2022-05-18 MED ORDER — DEXCOM G6 TRANSMITTER MISC: 0 refills | Status: DC

## 2022-05-18 MED ORDER — DEXCOM G6 SENSOR MISC: 0 refills | Status: DC

## 2022-05-25 ENCOUNTER — Other Ambulatory Visit: Payer: Self-pay | Admitting: Family Medicine

## 2022-05-28 DIAGNOSIS — I10 Essential (primary) hypertension: Secondary | ICD-10-CM | POA: Diagnosis not present

## 2022-05-28 DIAGNOSIS — E876 Hypokalemia: Secondary | ICD-10-CM | POA: Diagnosis not present

## 2022-05-28 DIAGNOSIS — E785 Hyperlipidemia, unspecified: Secondary | ICD-10-CM | POA: Diagnosis not present

## 2022-05-28 DIAGNOSIS — Z8249 Family history of ischemic heart disease and other diseases of the circulatory system: Secondary | ICD-10-CM | POA: Diagnosis not present

## 2022-05-28 DIAGNOSIS — Z803 Family history of malignant neoplasm of breast: Secondary | ICD-10-CM | POA: Diagnosis not present

## 2022-05-28 DIAGNOSIS — K219 Gastro-esophageal reflux disease without esophagitis: Secondary | ICD-10-CM | POA: Diagnosis not present

## 2022-05-28 DIAGNOSIS — Z833 Family history of diabetes mellitus: Secondary | ICD-10-CM | POA: Diagnosis not present

## 2022-05-28 DIAGNOSIS — E119 Type 2 diabetes mellitus without complications: Secondary | ICD-10-CM | POA: Diagnosis not present

## 2022-05-28 DIAGNOSIS — Z794 Long term (current) use of insulin: Secondary | ICD-10-CM | POA: Diagnosis not present

## 2022-05-28 DIAGNOSIS — Z6841 Body Mass Index (BMI) 40.0 and over, adult: Secondary | ICD-10-CM | POA: Diagnosis not present

## 2022-05-28 DIAGNOSIS — R600 Localized edema: Secondary | ICD-10-CM | POA: Diagnosis not present

## 2022-06-16 ENCOUNTER — Other Ambulatory Visit: Payer: Self-pay

## 2022-06-16 ENCOUNTER — Ambulatory Visit
Admission: RE | Admit: 2022-06-16 | Discharge: 2022-06-16 | Disposition: A | Discharge status: Home / Self Care | Payer: 59 | Source: Ambulatory Visit | Attending: Nurse Practitioner | Admitting: Nurse Practitioner

## 2022-06-16 VITALS — BP 114/64 | HR 63 | Temp 97.9°F | Resp 20

## 2022-06-16 DIAGNOSIS — H66002 Acute suppurative otitis media without spontaneous rupture of ear drum, left ear: Secondary | ICD-10-CM | POA: Diagnosis not present

## 2022-06-16 DIAGNOSIS — H60502 Unspecified acute noninfective otitis externa, left ear: Secondary | ICD-10-CM | POA: Diagnosis not present

## 2022-06-16 MED ORDER — OFLOXACIN 0.3 % OT SOLN: 10.0000 [drp] | Freq: Every day | OTIC | 0 refills | Status: AC

## 2022-06-16 MED ORDER — ACETAMINOPHEN 500 MG PO TABS
1000.0000 mg | ORAL_TABLET | Freq: Once | ORAL | Status: AC
  Administered 2022-06-16: 1000 mg via ORAL

## 2022-06-16 MED ORDER — AMOXICILLIN 875 MG PO TABS: 875.0000 mg | ORAL_TABLET | Freq: Two times a day (BID) | ORAL | 0 refills | Status: AC

## 2022-06-16 NOTE — Discharge Instructions (Addendum)
As we discussed today, you have an ear infection.  Please take the amoxicillin to treat the bacteria behind the eardrum.  Also start the eardrops to help treat the infection in the skin leading to your eardrum.  Avoid getting any water in your ears for the next couple of days that she can use a Vaseline type cottonball to prevent water from entering your ear canal.  Continue Tylenol 500 to 1000 mg every 6 hours as needed for pain.

## 2022-06-16 NOTE — ED Triage Notes (Signed)
Pt reports left ear pain since Saturday. Pt reports has used swimmer ear drops otc and reports no improvement in pain or discomfort. Reports pain is now moving down left side of neck.

## 2022-06-16 NOTE — ED Provider Notes (Signed)
[facility]    CSN: [id number] Arrival date & time: 06/16/22  1039      History   Chief Complaint Chief Complaint  Patient presents with   Ear Drainage    My ear has a throbbing pain. It hurts with wind. It's like a sharp piercing pain inside. - Entered by patient    HPI Kimberly Becker is a 42 y.o. female.   Patient presents today for 3-day history of left ear pain that she describes as moderate to severe and stabbing quality.  No recent fever, cough, congestion, or sore throat.  Reports initially, she did have yellow/green drainage, however none recently.  No ear itching, recent water immersion, or recent use of Q-tips.  Reports she does have muffled hearing from the left ear.  She has tried over-the-counter swimmer's ear drops without much benefit.  Patient was treated approximately 2 months ago with doxycycline for bacterial respiratory infection.    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 05/13/2021   Lower extremity edema 12/21/2020   Gastroesophageal reflux disease without esophagitis 09/19/2020   Menstrual periods irregular 01/16/2020   Type 1 diabetes mellitus ([place]) 09/26/2014   Anxiety 06/26/2013   Hyperlipidemia 03/20/2013   Morbid obesity ([place]) 11/08/2012   Migraines 08/10/2012    Past Surgical History:  Procedure Laterality Date    APPENDECTOMY  1992   BREAST BIOPSY Right 2012   benign    OB History     Gravida  2   Para  0   Term      Preterm      AB  2   Living  0      SAB  2   IAB      Ectopic      Multiple      Live Births               Home Medications    Prior to Admission medications   Medication Sig Start Date End Date Taking? Authorizing Provider  amoxicillin (AMOXIL) 875 MG tablet Take 1 tablet (875 mg total) by mouth 2 (two) times daily for 5 days. 06/16/22 06/21/22 Yes Eulogio Bear, NP  ofloxacin (FLOXIN) 0.3 % OTIC solution Place 10 drops into the left ear daily for 10 days. 06/16/22 06/26/22 Yes Eulogio Bear, NP  aspirin EC 81 MG tablet Take 81 mg by mouth daily. Swallow whole.    [provider]  Continuous Blood Gluc Sensor (DEXCOM G6 SENSOR) MISC APPLY 1 SENSOR AS DIRECTED EVERY 10 DAYS 05/18/22   Thersa Salt G, DO  Continuous Blood Gluc Transmit (DEXCOM G6 TRANSMITTER) MISC USE AS DIRECTED WITH SENSOR FOR 3 MONTHS 05/20/22   Coral Spikes, DO  Continuous Blood Gluc Transmit (DEXCOM G6 TRANSMITTER) MISC Apply as directed with sensor for 3 months 05/18/22  Lacinda Axon, Jayce G, DO  furosemide (LASIX) 40 MG tablet Take 1 tablet by mouth once daily 05/25/22   Cook, Jayce G, DO  insulin aspart (NOVOLOG) 100 UNIT/ML FlexPen INJECT 0-20 UNITS SUBCUTANEOUSLY 4 TIMES DAILY BEFORE MEALS AND AT BEDTIME AS PER SLIDING SCALE, 02/13/22   Nilda Simmer, NP  insulin detemir (LEVEMIR) 100 UNIT/ML FlexPen Inject 20 units into the skin qhs 08/01/21   Nilda Simmer, NP  Multiple Vitamin (MULTIVITAMIN WITH MINERALS) TABS tablet Take 1 tablet by mouth daily.    [provider]  OVER THE COUNTER MEDICATION Omega red    [provider]  potassium chloride (KLOR-CON) 10 MEQ tablet Take 2 tablets by mouth once daily 05/25/22   Thersa Salt G, DO  rosuvastatin (CRESTOR) 10 MG tablet Take 1 tablet (10 mg total) by mouth daily. 05/05/22   Coral Spikes, DO  triamcinolone  ointment (KENALOG) 0.5 % Apply 1 Application topically 2 (two) times daily. 03/30/22   Coral Spikes, DO  valsartan (DIOVAN) 40 MG tablet TAKE 1 TABLET BY MOUTH ONCE DAILY FOR BLOOD PRESSURE AND FOR DIABETES 05/25/22   Coral Spikes, DO    Family History Family History  Problem Relation Age of Onset   Cancer Mother        Ovarian Cancer   Heart disease Mother    Heart disease Maternal Grandfather    Diabetes Maternal Grandfather    Heart disease Maternal Grandmother 51       first MI at age 71; second at 31   Breast cancer Maternal Aunt    Cancer Paternal Grandmother 83       breast cancer   Cancer Cousin 41       before age 34; double mastectomy; breast CA   Diabetes Paternal Grandfather    Cancer Other        Breast Cancer-Aunt   Breast cancer Maternal Aunt     Social History Social History   Tobacco Use   Smoking status: Never   Smokeless tobacco: Never  Vaping Use   Vaping Use: Never used  Substance Use Topics   Alcohol use: No   Drug use: No     Allergies   Patient has no known allergies.   Review of Systems Review of Systems Per HPI  Physical Exam Triage Vital Signs ED Triage Vitals  Enc Vitals Group     BP 06/16/22 1120 114/64     Pulse Rate 06/16/22 1120 63     Resp 06/16/22 1120 20     Temp 06/16/22 1120 97.9 F (36.6 C)     Temp Source 06/16/22 1120 Oral     SpO2 06/16/22 1120 96 %     Weight --      Height --      Head Circumference --      Peak Flow --      Pain Score 06/16/22 1119 8     Pain Loc --      Pain Edu? --      Excl. in [place]? --    No data found.  Updated Vital Signs BP 114/64 (BP Location: Right Arm)   Pulse 63   Temp 97.9 F (36.6 C) (Oral)   Resp 20   LMP 06/15/2022 (Approximate)   SpO2 96%   Visual Acuity Right Eye Distance:   Left Eye Distance:   Bilateral Distance:    Right Eye Near:   Left Eye Near:    Bilateral Near:  Physical Exam Vitals and nursing note reviewed.  Constitutional:      General:  She is not in acute distress.    Appearance: Normal appearance. She is not toxic-appearing.  HENT:     Head: Normocephalic and atraumatic.     Right Ear: No tenderness. No middle ear effusion. There is no impacted cerumen. Tympanic membrane is scarred. Tympanic membrane is not injected, erythematous, retracted or bulging.     Left Ear: Tenderness present.  No middle ear effusion. There is no impacted cerumen. Tympanic membrane is injected, erythematous and retracted. Tympanic membrane is not perforated or bulging.     Nose: No congestion or rhinorrhea.     Mouth/Throat:     Mouth: Mucous membranes are moist.     Pharynx: Oropharynx is clear. No posterior oropharyngeal erythema.  Eyes:     General: No scleral icterus.    Extraocular Movements: Extraocular movements intact.  Musculoskeletal:     Cervical back: Normal range of motion.  Lymphadenopathy:     Cervical: No cervical adenopathy.  Skin:    General: Skin is warm and dry.     Capillary Refill: Capillary refill takes less than 2 seconds.     Coloration: Skin is not jaundiced or pale.     Findings: No erythema.  Neurological:     Mental Status: She is alert and oriented to person, place, and time.  Psychiatric:        Behavior: Behavior is cooperative.      UC Treatments / Results  Labs (all labs ordered are listed, but only abnormal results are displayed) Labs Reviewed - No data to display  EKG   Radiology No results found.  Procedures Procedures (including critical care time)  Medications Ordered in UC Medications  acetaminophen (TYLENOL) tablet 1,000 mg (has no administration in time range)    Initial Impression / Assessment and Plan / UC Course  I have reviewed the triage vital signs and the nursing notes.  Pertinent labs & imaging results that were available during my care of the patient were reviewed by me and considered in my medical decision making (see chart for details).   Patient is well-appearing,  normotensive, afebrile, not tachycardic, not tachypneic, oxygenating well on room air.    1. Non-recurrent acute suppurative otitis media of left ear without spontaneous rupture of tympanic membrane 2. Acute otitis externa of left ear, unspecified type Treat with amoxicillin twice daily for 5 days along with ofloxacin drops daily for 10 days Supportive care discussed Ear precautions discussed Follow-up with PCP recommended for no improvement or worsening symptoms despite treatment  The patient was given the opportunity to ask questions.  All questions answered to their satisfaction.  The patient is in agreement to this plan.    Final Clinical Impressions(s) / UC Diagnoses   Final diagnoses:  Non-recurrent acute suppurative otitis media of left ear without spontaneous rupture of tympanic membrane  Acute otitis externa of left ear, unspecified type     Discharge Instructions      As we discussed today, you have an ear infection.  Please take the amoxicillin to treat the bacteria behind the eardrum.  Also start the eardrops to help treat the infection in the skin leading to your eardrum.  Avoid getting any water in your ears for the next couple of days that she can use a Vaseline type cottonball to prevent water from entering your ear canal.  Continue Tylenol 500 to 1000 mg every 6 hours as needed  for pain.    ED Prescriptions     Medication Sig Dispense Auth. Provider   ofloxacin (FLOXIN) 0.3 % OTIC solution Place 10 drops into the left ear daily for 10 days. 5 mL Noemi Chapel A, NP   amoxicillin (AMOXIL) 875 MG tablet Take 1 tablet (875 mg total) by mouth 2 (two) times daily for 5 days. 10 tablet Eulogio Bear, NP      PDMP not reviewed this encounter.   Eulogio Bear, NP 06/16/22 1135

## 2022-06-19 ENCOUNTER — Ambulatory Visit: Payer: 59

## 2022-07-06 ENCOUNTER — Encounter: Payer: Self-pay | Admitting: Family Medicine

## 2022-07-06 ENCOUNTER — Telehealth (INDEPENDENT_AMBULATORY_CARE_PROVIDER_SITE_OTHER): Payer: 59 | Admitting: Family Medicine

## 2022-07-06 DIAGNOSIS — G4700 Insomnia, unspecified: Secondary | ICD-10-CM

## 2022-07-06 MED ORDER — TRAZODONE HCL 50 MG PO TABS: 50.0000 mg | ORAL_TABLET | Freq: Every day | ORAL | 3 refills | Status: DC

## 2022-07-06 NOTE — Progress Notes (Signed)
Virtual Visit via Video Note  I connected with [place] on 07/06/22 at  1:10 PM EDT by a video enabled telemedicine application and verified that I am speaking with the correct person using two identifiers.  Location: Patient: Home Provider: Office   I discussed the limitations of evaluation and management by telemedicine and the availability of in person appointments. The patient expressed understanding and agreed to proceed.  History of Present Illness:  42 year old female presents for evaluation of insomnia.   4 week history of insomnia.  Has both difficulty falling asleep and staying asleep.  When she is able to fall asleep she wakes up frequently.  Denies any recent stressors or inciting factors that she can think of.  No known relieving factors.  No other complaints or concerns at this time.  Observations/Objective: General: Well-appearing female in no acute distress. Respiratory: No appreciable increased work of breathing.  Assessment and Plan:  Insomnia -Trial of trazodone.  Meds ordered this encounter  Medications   traZODone (DESYREL) 50 MG tablet    Sig: Take 1 tablet (50 mg total) by mouth at bedtime.    Dispense:  90 tablet    Refill:  3   Follow Up Instructions:    I discussed the assessment and treatment plan with the patient. The patient was provided an opportunity to ask questions and all were answered. The patient agreed with the plan and demonstrated an understanding of the instructions.   The patient was advised to call back or seek an in-person evaluation if the symptoms worsen or if the condition fails to improve as anticipated.  I provided 5 minutes of non-face-to-face time during this encounter.   Coral Spikes, DO

## 2022-07-08 LAB — HM DIABETES EYE EXAM

## 2022-07-09 ENCOUNTER — Encounter: Payer: Self-pay | Admitting: Family Medicine

## 2022-07-20 ENCOUNTER — Other Ambulatory Visit: Payer: Self-pay | Admitting: Family Medicine

## 2022-07-20 MED ORDER — TRAZODONE HCL 100 MG PO TABS: 100.0000 mg | ORAL_TABLET | Freq: Every day | ORAL | 1 refills | Status: DC

## 2022-08-03 ENCOUNTER — Ambulatory Visit: Payer: 59 | Admitting: Family Medicine

## 2022-08-03 ENCOUNTER — Encounter: Payer: Self-pay | Admitting: Family Medicine

## 2022-08-03 NOTE — Telephone Encounter (Signed)
Appointment was cancelled per patient's request.

## 2022-08-11 ENCOUNTER — Ambulatory Visit (INDEPENDENT_AMBULATORY_CARE_PROVIDER_SITE_OTHER): Payer: 59 | Admitting: Family Medicine

## 2022-08-11 ENCOUNTER — Encounter: Payer: Self-pay | Admitting: Family Medicine

## 2022-08-11 DIAGNOSIS — I10 Essential (primary) hypertension: Secondary | ICD-10-CM | POA: Diagnosis not present

## 2022-08-11 DIAGNOSIS — E1069 Type 1 diabetes mellitus with other specified complication: Secondary | ICD-10-CM

## 2022-08-11 MED ORDER — DEXCOM G6 TRANSMITTER MISC: 3 refills | Status: DC

## 2022-08-11 MED ORDER — DEXCOM G6 SENSOR MISC: 3 refills | Status: DC

## 2022-08-11 MED ORDER — INSULIN DETEMIR 100 UNIT/ML FLEXPEN: PEN_INJECTOR | SUBCUTANEOUS | 6 refills | Status: DC

## 2022-08-11 MED ORDER — INSULIN ASPART 100 UNIT/ML FLEXPEN: PEN_INJECTOR | SUBCUTANEOUS | 5 refills | Status: DC

## 2022-08-11 NOTE — Assessment & Plan Note (Signed)
We discussed recommendations regarding nutrition.  Referral placed to healthy weight and wellness again.

## 2022-08-11 NOTE — Progress Notes (Signed)
Subjective:  Patient ID: Kimberly Becker, female    DOB: Oct 19, 1980  Age: 42 y.o. MRN: [id number]  CC: Chief Complaint  Patient presents with   Diabetes    Follow up and refills   Obesity    Per patient , dietician rescheduled her 5 times - did not get to see them yet     HPI:  42 year old female with hypertension, migraine, type 1 diabetes, anxiety, morbid obesity, hyperlipidemia presents for follow-up.  Blood sugars have remained well-controlled.  No hypoglycemia.  Needs refill on her insulin.  Patient has yet to see nutrition or healthy weight and wellness due to scheduling issues.  Hypertension stable on valsartan and Lasix.  Patient is very concerned about her weight.  Will discuss dietary changes and recommendations today.  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 05/13/2021   Lower extremity edema 12/21/2020   Gastroesophageal reflux disease without esophagitis 09/19/2020   Menstrual periods irregular 01/16/2020   Type 1 diabetes mellitus ([facility]) 09/26/2014   Anxiety 06/26/2013   Hyperlipidemia 03/20/2013   Morbid obesity ([facility]) 11/08/2012   Migraines 08/10/2012    Social Hx   Social History   Socioeconomic History   Marital status: Married    Spouse name: Not on file   Number of children: Not on file   Years of education: 14   Highest education level: Associate degree: academic program  Occupational History   Occupation: Manufacturing systems engineer  Tobacco Use   Smoking status: Never   Smokeless tobacco: Never  Vaping Use   Vaping Use: Never used  Substance and Sexual Activity   Alcohol use: No   Drug use: No   Sexual activity: Yes    Birth control/protection: None    Comment: heterosexual relationship  Other Topics Concern   Not on file  Social History Narrative   Not on file   Social Determinants of Health   Financial  Resource Strain: Low Risk  (08/10/2022)   Overall Financial Resource Strain (CARDIA)    Difficulty of Paying Living Expenses: Not very hard  Food Insecurity: No Food Insecurity (08/10/2022)   Hunger Vital Sign    Worried About Running Out of Food in the Last Year: Never true    Ran Out of Food in the Last Year: Never true  Transportation Needs: No Transportation Needs (08/10/2022)   PRAPARE - Administrator, Civil Service (Medical): No    Lack of Transportation (Non-Medical): No  Physical Activity: Unknown (08/10/2022)   Exercise Vital Sign    Days of Exercise per Week: 2 days    Minutes of Exercise per Session: Not on file  Stress: No Stress Concern Present (08/10/2022)   [employer] of Occupational Health - Occupational Stress Questionnaire    Feeling of Stress : Only a little  Social Connections: Unknown (08/10/2022)   Social Connection and Isolation Panel [NHANES]    Frequency of Communication with Friends and Family: More than three times a week    Frequency of Social Gatherings with Friends and Family: Once a week    Attends Religious Services: Patient declined    Database administrator or Organizations: No    Attends Engineer, structural: Not on file    Marital Status: Married    Review of Systems Per HPI  Objective:  BP 123/74   Pulse 62   Temp 98  F (36.7 C)   Ht 5\' 3"  (1.6 m)   Wt 256 lb (116.1 kg)   SpO2 97%   BMI 45.35 kg/m      08/11/2022   10:58 AM 06/16/2022   11:20 AM 05/04/2022    3:15 PM  BP/Weight  Systolic BP 123 114 100  Diastolic BP 74 64 65  Wt. (Lbs) 256  248.4  BMI 45.35 kg/m2  44 kg/m2    Physical Exam Vitals and nursing note reviewed.  Constitutional:      General: She is not in acute distress.    Appearance: Normal appearance. She is obese.  HENT:     Head: Normocephalic and atraumatic.  Eyes:     General:        Right eye: No discharge.        Left eye: No discharge.     Conjunctiva/sclera: Conjunctivae  normal.  Cardiovascular:     Rate and Rhythm: Normal rate and regular rhythm.  Pulmonary:     Effort: Pulmonary effort is normal.     Breath sounds: Normal breath sounds. No wheezing, rhonchi or rales.  Neurological:     Mental Status: She is alert.  Psychiatric:        Mood and Affect: Mood normal.        Behavior: Behavior normal.     Lab Results  Component Value Date   WBC 10.7 05/13/2022   HGB 13.3 05/13/2022   HCT 40.6 05/13/2022   PLT 329 05/13/2022   GLUCOSE 235 (H) 05/13/2022   CHOL 165 05/13/2022   TRIG 149 05/13/2022   HDL 59 05/13/2022   LDLCALC 80 05/13/2022   ALT 23 05/13/2022   AST 22 05/13/2022   NA 136 05/13/2022   K 4.1 05/13/2022   CL 98 05/13/2022   CREATININE 1.09 (H) 05/13/2022   BUN 17 05/13/2022   CO2 22 05/13/2022   TSH 1.810 08/19/2021   HGBA1C 6.6 (H) 05/13/2022   MICROALBUR 0.2 05/18/2014     Assessment & Plan:   Problem List Items Addressed This Visit       Cardiovascular and Mediastinum   Essential hypertension    Stable.  Continue valsartan and Lasix.        Endocrine   Type 1 diabetes mellitus ([facility])    Stable.  Continue current insulin regimen.  Refilled today.      Relevant Medications   insulin aspart (NOVOLOG) 100 UNIT/ML FlexPen   insulin detemir (LEVEMIR) 100 UNIT/ML FlexPen     Other   Morbid obesity ([facility])    We discussed recommendations regarding nutrition.  Referral placed to healthy weight and wellness again.      Relevant Medications   insulin aspart (NOVOLOG) 100 UNIT/ML FlexPen   insulin detemir (LEVEMIR) 100 UNIT/ML FlexPen   Other Relevant Orders   Amb Ref to Medical Weight Management    Meds ordered this encounter  Medications   Continuous Glucose Sensor (DEXCOM G6 SENSOR) MISC    Sig: APPLY 1 SENSOR AS DIRECTED EVERY 10 DAYS    Dispense:  9 each    Refill:  3   Continuous Glucose Transmitter (DEXCOM G6 TRANSMITTER) MISC    Sig: USE AS DIRECTED WITH SENSOR FOR 3 MONTHS    Dispense:  1 each     Refill:  3   insulin aspart (NOVOLOG) 100 UNIT/ML FlexPen    Sig: INJECT 0-20 UNITS SUBCUTANEOUSLY 4 TIMES DAILY BEFORE MEALS AND AT BEDTIME AS PER SLIDING SCALE,  Dispense:  15 mL    Refill:  5   insulin detemir (LEVEMIR) 100 UNIT/ML FlexPen    Sig: Inject 20 units into the skin qhs    Dispense:  15 mL    Refill:  6    Follow-up:  Return in about 6 months (around 02/10/2023).  Everlene Other DO [facility]

## 2022-08-11 NOTE — Patient Instructions (Signed)
Continue your medication.  Referral is back in.  Take care  Dr. Adriana Simas

## 2022-08-11 NOTE — Assessment & Plan Note (Signed)
Stable.  Continue current insulin regimen.  Refilled today.

## 2022-08-11 NOTE — Assessment & Plan Note (Signed)
Stable.  Continue valsartan and Lasix.

## 2022-08-13 ENCOUNTER — Encounter: Payer: Self-pay | Admitting: Family Medicine

## 2022-08-13 ENCOUNTER — Other Ambulatory Visit: Payer: Self-pay | Admitting: Family Medicine

## 2022-08-13 MED ORDER — DESONIDE 0.05 % EX CREA: TOPICAL_CREAM | Freq: Two times a day (BID) | CUTANEOUS | 0 refills | Status: DC | PRN

## 2022-08-25 ENCOUNTER — Other Ambulatory Visit: Payer: Self-pay | Admitting: Family Medicine

## 2022-09-01 NOTE — Progress Notes (Signed)
[facility] Quality Team Note  Name: Kimberly Becker Date of Birth: 1981/02/29 MRN: [id number] Date: 09/01/2022  [facility] Quality Team has reviewed this patient's chart, please see recommendations below:  [facility] Quality Other; (Called to offer patient [facility] event 09/03/22, left voicemail.)

## 2022-09-08 ENCOUNTER — Encounter: Payer: Self-pay | Admitting: *Deleted

## 2022-09-16 ENCOUNTER — Encounter: Payer: Self-pay | Admitting: Family Medicine

## 2022-09-17 ENCOUNTER — Other Ambulatory Visit: Payer: Self-pay | Admitting: Nurse Practitioner

## 2022-09-17 MED ORDER — INSULIN ASPART 100 UNIT/ML FLEXPEN: PEN_INJECTOR | SUBCUTANEOUS | 5 refills | Status: DC

## 2022-10-13 ENCOUNTER — Encounter (INDEPENDENT_AMBULATORY_CARE_PROVIDER_SITE_OTHER): Payer: 59 | Admitting: Physician Assistant

## 2022-11-22 ENCOUNTER — Other Ambulatory Visit: Payer: Self-pay | Admitting: Family Medicine

## 2022-12-20 ENCOUNTER — Other Ambulatory Visit: Payer: Self-pay | Admitting: Family Medicine

## 2022-12-23 ENCOUNTER — Other Ambulatory Visit: Payer: Self-pay | Admitting: Family Medicine

## 2022-12-23 ENCOUNTER — Encounter: Payer: Self-pay | Admitting: Family Medicine

## 2022-12-27 ENCOUNTER — Encounter: Payer: Self-pay | Admitting: Family Medicine

## 2022-12-29 NOTE — Telephone Encounter (Signed)
Cook, [place] G, DO    Yes.

## 2022-12-31 ENCOUNTER — Other Ambulatory Visit: Payer: Self-pay

## 2022-12-31 DIAGNOSIS — M7989 Other specified soft tissue disorders: Secondary | ICD-10-CM

## 2023-01-25 ENCOUNTER — Other Ambulatory Visit: Payer: Self-pay

## 2023-01-25 ENCOUNTER — Encounter: Payer: Self-pay | Admitting: Family Medicine

## 2023-01-25 MED ORDER — INSULIN ASPART 100 UNIT/ML FLEXPEN: PEN_INJECTOR | SUBCUTANEOUS | 5 refills | Status: DC

## 2023-01-29 ENCOUNTER — Other Ambulatory Visit: Payer: Self-pay

## 2023-01-29 DIAGNOSIS — I872 Venous insufficiency (chronic) (peripheral): Secondary | ICD-10-CM

## 2023-02-09 ENCOUNTER — Other Ambulatory Visit: Payer: Self-pay | Admitting: Family Medicine

## 2023-02-09 NOTE — Progress Notes (Signed)
Office Note     CC:  Leg and foot swelling Requesting Provider:  Tommie Sams, DO  HPI: Kimberly Becker is a 42 y.o. (March 10, 1981) female who presents at the request of Tommie Sams, DO for evaluation of left lower extremity edema.  On exam, Kimberly Becker was doing well.  Originally from [place], she now lives in [place].  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  Her oldest is 40.  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  Swelling bilaterally, which is most pronounced in the left calf.  When she was 11, her left leg was caught between 2 railroad ties.  Once the railroad ties were removed, she stated her left leg was 3 times at the size.  Left lower extremity symptoms include heavy and tired feeling by days end, when the leg is largest.  Denies varicosities, bleeding, ulceration.  Elevation improves symptoms and size.  She last wore compression stockings when she was 11 after her accident.  No history of DVT, no history of previous venous procedures.  Denies swelling on the dorsal aspect of the foot or at the toes.  No recent weight loss or weight gain   Past Medical History:  Diagnosis Date   Abnormal Papanicolaou smear of cervix with positive human papilloma virus (HPV) test 05/13/2021   05/13/21 pap negative for malignancy, and HPV 16/18/45, but +HPV other, repeat in 1 year per ASCCP guidelines, 5 year risk for CIN 3+ is 4.8%    Anxiety    History of chickenpox    Migraine headache    PCOS (polycystic ovarian syndrome)    Type I (juvenile type) diabetes mellitus without mention of complication, not stated as uncontrolled     Past Surgical History:  Procedure Laterality Date   APPENDECTOMY  1992   BREAST BIOPSY Right 2012   benign    Social History   Socioeconomic History   Marital status: Married    Spouse name: Not on file   Number of children: Not on file   Years of education: 14   Highest education level: Associate  degree: academic program  Occupational History   Occupation: Manufacturing systems engineer  Tobacco Use   Smoking status: Never   Smokeless tobacco: Never  Vaping Use   Vaping status: Never Used  Substance and Sexual Activity   Alcohol use: No   Drug use: No   Sexual activity: Yes    Birth control/protection: None    Comment: heterosexual relationship  Other Topics Concern   Not on file  Social History Narrative   Not on file   Social Determinants of Health   Financial Resource Strain: Low Risk  (08/10/2022)   Overall Financial Resource Strain (CARDIA)    Difficulty of Paying Living Expenses: Not very hard  Food Insecurity: No Food Insecurity (08/10/2022)   Hunger Vital Sign    Worried About Running Out of Food in the Last Year: Never true    Ran Out of Food in the Last Year: Never true  Transportation Needs: No Transportation Needs (08/10/2022)   PRAPARE - Administrator, Civil Service (Medical): No    Lack of Transportation (Non-Medical): No  Physical Activity: Unknown (08/10/2022)   Exercise Vital Sign    Days of Exercise per Week: 2 days    Minutes of Exercise per Session: Not on file  Stress: No Stress Concern Present (08/10/2022)   Kimberly Becker  of Occupational Health - Occupational Stress Questionnaire    Feeling of Stress : Only a little  Social Connections: Unknown (08/10/2022)   Social Connection and Isolation Panel [NHANES]    Frequency of Communication with Friends and Family: More than three times a week    Frequency of Social Gatherings with Friends and Family: Once a week    Attends Religious Services: Patient declined    Database administrator or Organizations: No    Attends Engineer, structural: Not on file    Marital Status: Married  Catering manager Violence: Not At Risk (05/07/2021)   Humiliation, Afraid, Rape, and Kick questionnaire    Fear of Current or Ex-Partner: No    Emotionally Abused: No    Physically Abused: No    Sexually Abused:  No   Family History  Problem Relation Age of Onset   Cancer Mother        Ovarian Cancer   Heart disease Mother    Heart disease Maternal Grandfather    Diabetes Maternal Grandfather    Heart disease Maternal Grandmother 101       first MI at age 13; second at 82   Breast cancer Maternal Aunt    Cancer Paternal Grandmother 66       breast cancer   Cancer Cousin 49       before age 80; double mastectomy; breast CA   Diabetes Paternal Grandfather    Cancer Other        Breast Cancer-Aunt   Breast cancer Maternal Aunt     Current Outpatient Medications  Medication Sig Dispense Refill   desonide (DESOWEN) 0.05 % cream Apply topically 2 (two) times daily as needed. 30 g 0   aspirin EC 81 MG tablet Take 81 mg by mouth daily. Swallow whole.     Continuous Blood Gluc Transmit (DEXCOM G6 TRANSMITTER) MISC Apply as directed with sensor for 3 months 1 each 0   Continuous Glucose Sensor (DEXCOM G6 SENSOR) MISC APPLY 1 SENSOR AS DIRECTED EVERY 10 DAYS 9 each 3   Continuous Glucose Transmitter (DEXCOM G6 TRANSMITTER) MISC USE AS DIRECTED WITH SENSOR FOR 3 MONTHS 1 each 3   furosemide (LASIX) 40 MG tablet Take 1 tablet by mouth once daily 90 tablet 0   insulin aspart (NOVOLOG) 100 UNIT/ML FlexPen INJECT 0-20 UNITS SUBCUTANEOUSLY 4 TIMES DAILY BEFORE MEALS AND AT BEDTIME AS PER SLIDING SCALE, 15 mL 5   insulin detemir (LEVEMIR) 100 UNIT/ML FlexPen Inject 20 units into the skin qhs 15 mL 6   Multiple Vitamin (MULTIVITAMIN WITH MINERALS) TABS tablet Take 1 tablet by mouth daily.     OVER THE COUNTER MEDICATION Omega red     potassium chloride (KLOR-CON) 10 MEQ tablet Take 2 tablets by mouth once daily 180 tablet 0   rosuvastatin (CRESTOR) 10 MG tablet Take 1 tablet (10 mg total) by mouth daily. 90 tablet 3   traZODone (DESYREL) 100 MG tablet TAKE 1 TABLET BY MOUTH AT BEDTIME 90 tablet 0   triamcinolone ointment (KENALOG) 0.5 % Apply 1 Application topically 2 (two) times daily. 30 g 0   valsartan  (DIOVAN) 40 MG tablet TAKE 1 TABLET BY MOUTH ONCE DAILY FOR BLOOD PRESSURE AND FOR DIABETES 90 tablet 0   No current facility-administered medications for this visit.    No Known Allergies   REVIEW OF SYSTEMS:  [X]  denotes positive finding, [ ]  denotes negative finding Cardiac  Comments:  Chest pain or chest pressure:  Shortness of breath upon exertion:    Short of breath when lying flat:    Irregular heart rhythm:        Vascular    Pain in calf, thigh, or hip brought on by ambulation:    Pain in feet at night that wakes you up from your sleep:     Blood clot in your veins:    Leg swelling:         Pulmonary    Oxygen at home:    Productive cough:     Wheezing:         Neurologic    Sudden weakness in arms or legs:     Sudden numbness in arms or legs:     Sudden onset of difficulty speaking or slurred speech:    Temporary loss of vision in one eye:     Problems with dizziness:         Gastrointestinal    Blood in stool:     Vomited blood:         Genitourinary    Burning when urinating:     Blood in urine:        Psychiatric    Major depression:         Hematologic    Bleeding problems:    Problems with blood clotting too easily:        Skin    Rashes or ulcers:        Constitutional    Fever or chills:      PHYSICAL EXAMINATION:  There were no vitals filed for this visit.  General:  WDWN in NAD; vital signs documented above Gait: Not observed HENT: WNL, normocephalic Pulmonary: normal non-labored breathing , without Rales, rhonchi,  wheezing Cardiac: regular HR,  Abdomen: soft, NT, no masses Skin: without rashes Vascular Exam/Pulses:  Right Left  Radial 2+ (normal) 2+ (normal)  Ulnar    Femoral    Popliteal    DP 2+ (normal) 2+ (normal)  PT     Extremities: without ischemic changes, without Gangrene , without cellulitis; without open wounds;  Musculoskeletal: no muscle wasting or atrophy  Neurologic: A&O X 3;  No focal weakness or  paresthesias are detected Psychiatric:  The pt has Normal affect.   Non-Invasive Vascular Imaging:    +--------------+---------+------+-----------+------------+--------+  LEFT         Reflux NoRefluxReflux TimeDiameter cmsComments                          Yes                                   +--------------+---------+------+-----------+------------+--------+  CFV                    yes   >1 second                       +--------------+---------+------+-----------+------------+--------+  FV mid        no                                              +--------------+---------+------+-----------+------------+--------+  Popliteal    no                                              +--------------+---------+------+-----------+------------+--------+  GSV at [facility]    no                            .631              +--------------+---------+------+-----------+------------+--------+  GSV prox thigh          yes    >500 ms      .438              +--------------+---------+------+-----------+------------+--------+  GSV mid thigh                               .409              +--------------+---------+------+-----------+------------+--------+  GSV dist thighno                            .387              +--------------+---------+------+-----------+------------+--------+  GSV at knee   no                            .425              +--------------+---------+------+-----------+------------+--------+  GSV prox calf no                            .303              +--------------+---------+------+-----------+------------+--------+  GSV mid calf  no                            .312              +--------------+---------+------+-----------+------------+--------+  SSV Pop Fossa no                            .499              +--------------+---------+------+-----------+------------+--------+  SSV prox calf no                             .513              +--------------+---------+------+-----------+------------+--------+     ASSESSMENT/PLAN:: 42 y.o. female presenting with mild left lower extremity leg and foot swelling.  History of trauma at the age of 31 in which Kimberly Becker notes that the extremity was quite mangled.  On physical exam, a small amount of telangiectasias.  No significant varicosities. Venous duplex ultrasound demonstrated only focal reflux in the common femoral vein as well as the GSV in the proximal thigh.  Had a long conversation with Kimberly Becker regarding the above.  I think the swelling that she appreciates by days end is most likely secondary to the trauma she experienced years ago.  She does not have significant venous reflux.  She would not benefit from greater saphenous vein ablation.    She would be best served with conservative management-compression stockings during the day.  Izabellah can follow-up with my office should any questions or concerns arise.    Kimberly Sparrow, MD [facility] [phone]

## 2023-02-10 ENCOUNTER — Ambulatory Visit (INDEPENDENT_AMBULATORY_CARE_PROVIDER_SITE_OTHER): Payer: 59 | Admitting: Family Medicine

## 2023-02-10 VITALS — BP 118/58 | HR 74 | Ht 63.0 in | Wt 252.6 lb

## 2023-02-10 DIAGNOSIS — Z23 Encounter for immunization: Secondary | ICD-10-CM | POA: Diagnosis not present

## 2023-02-10 DIAGNOSIS — I10 Essential (primary) hypertension: Secondary | ICD-10-CM | POA: Diagnosis not present

## 2023-02-10 DIAGNOSIS — E1069 Type 1 diabetes mellitus with other specified complication: Secondary | ICD-10-CM

## 2023-02-10 DIAGNOSIS — Z130 Encounter for screening for diseases of the blood and blood-forming organs and certain disorders involving the immune mechanism: Secondary | ICD-10-CM | POA: Diagnosis not present

## 2023-02-10 DIAGNOSIS — E785 Hyperlipidemia, unspecified: Secondary | ICD-10-CM

## 2023-02-10 MED ORDER — DEXCOM G7 SENSOR MISC: 3 refills | Status: DC

## 2023-02-10 MED ORDER — ROSUVASTATIN CALCIUM 10 MG PO TABS: 10.0000 mg | ORAL_TABLET | Freq: Every day | ORAL | 3 refills | Status: DC

## 2023-02-10 MED ORDER — FUROSEMIDE 40 MG PO TABS: 40.0000 mg | ORAL_TABLET | Freq: Every day | ORAL | 0 refills | Status: DC

## 2023-02-10 MED ORDER — VALSARTAN 40 MG PO TABS: ORAL_TABLET | ORAL | 0 refills | Status: DC

## 2023-02-10 NOTE — Assessment & Plan Note (Signed)
Stable.  Continue current medications.  Medications refilled.

## 2023-02-10 NOTE — Assessment & Plan Note (Addendum)
Continue current insulin regimen.  A1c today.  Rx sent for Dexcom G7.

## 2023-02-10 NOTE — Assessment & Plan Note (Signed)
Lipid panel to assess.  Continue Crestor.

## 2023-02-10 NOTE — Progress Notes (Signed)
Subjective:  Patient ID: Kimberly Becker, female    DOB: 01/02/1981  Age: 42 y.o. MRN: [id number]  CC: Follow-up  HPI:  42 year old female presents for follow up.   HTN stable on valsartan and Lasix.  Diabetes has been stable.  Needs A1c.  Patient would like to try Dexcom G7 as it is smaller.  Kimberly Becker contacted her insurance company but they stated that they could not give her cost information until Kimberly Becker had a new prescription.  Will send in today.  Patient lipids have been fairly well-controlled on Crestor.  Needs lipid panel today.  Needs refill on Crestor.  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 05/13/2021   Lower extremity edema 12/21/2020   Gastroesophageal reflux disease without esophagitis 09/19/2020   Menstrual periods irregular 01/16/2020   Type 1 diabetes mellitus (HCC) 09/26/2014   Anxiety 06/26/2013   Hyperlipidemia 03/20/2013   Morbid obesity (HCC) 11/08/2012   Migraines 08/10/2012    Social Hx   Social History   Socioeconomic History   Marital status: Married    Spouse name: Not on file   Number of children: Not on file   Years of education: 14   Highest education level: Associate degree: academic program  Occupational History   Occupation: Manufacturing systems engineer  Tobacco Use   Smoking status: Never   Smokeless tobacco: Never  Vaping Use   Vaping status: Never Used  Substance and Sexual Activity   Alcohol use: No   Drug use: No   Sexual activity: Yes    Birth control/protection: None    Comment: heterosexual relationship  Other Topics Concern   Not on file  Social History Narrative   Not on file   Social Determinants of Health   Financial Resource Strain: Low Risk  (08/10/2022)   Overall Financial Resource Strain (CARDIA)    Difficulty of Paying Living Expenses: Not very hard  Food Insecurity: No Food Insecurity  (08/10/2022)   Hunger Vital Sign    Worried About Running Out of Food in the Last Year: Never true    Ran Out of Food in the Last Year: Never true  Transportation Needs: No Transportation Needs (08/10/2022)   PRAPARE - Administrator, Civil Service (Medical): No    Lack of Transportation (Non-Medical): No  Physical Activity: Unknown (08/10/2022)   Exercise Vital Sign    Days of Exercise per Week: 2 days    Minutes of Exercise per Session: Not on file  Stress: No Stress Concern Present (08/10/2022)   [employer] of Occupational Health - Occupational Stress Questionnaire    Feeling of Stress : Only a little  Social Connections: Unknown (08/10/2022)   Social Connection and Isolation Panel [NHANES]    Frequency of Communication with Friends and Family: More than three times a week    Frequency of Social Gatherings with Friends and Family: Once a week    Attends Religious Services: Patient declined    Database administrator or Organizations: No    Attends Engineer, structural: Not on file    Marital Status: Married    Review of Systems  Constitutional: Negative.   Respiratory: Negative.    Cardiovascular: Negative.     Objective:  BP (!) 118/58   Pulse 74   Ht 5\' 3"  (1.6 m)   Wt 252 lb 9.6 oz (114.6 kg)  SpO2 96%   BMI 44.75 kg/m      02/10/2023   10:27 AM 08/11/2022   10:58 AM 06/16/2022   11:20 AM  BP/Weight  Systolic BP 118 123 114  Diastolic BP 58 74 64  Wt. (Lbs) 252.6 256   BMI 44.75 kg/m2 45.35 kg/m2     Physical Exam Vitals and nursing note reviewed.  Constitutional:      General: Kimberly Becker is not in acute distress.    Appearance: Normal appearance.  Cardiovascular:     Rate and Rhythm: Normal rate and regular rhythm.  Pulmonary:     Effort: Pulmonary effort is normal.     Breath sounds: Normal breath sounds.  Neurological:     Mental Status: Kimberly Becker is alert.  Psychiatric:        Mood and Affect: Mood normal.        Behavior: Behavior  normal.     Lab Results  Component Value Date   WBC 10.7 05/13/2022   HGB 13.3 05/13/2022   HCT 40.6 05/13/2022   PLT 329 05/13/2022   GLUCOSE 235 (H) 05/13/2022   CHOL 165 05/13/2022   TRIG 149 05/13/2022   HDL 59 05/13/2022   LDLCALC 80 05/13/2022   ALT 23 05/13/2022   AST 22 05/13/2022   NA 136 05/13/2022   K 4.1 05/13/2022   CL 98 05/13/2022   CREATININE 1.09 (H) 05/13/2022   BUN 17 05/13/2022   CO2 22 05/13/2022   TSH 1.810 08/19/2021   HGBA1C 6.6 (H) 05/13/2022   MICROALBUR 0.2 05/18/2014     Assessment & Plan:   Problem List Items Addressed This Visit       Cardiovascular and Mediastinum   Essential hypertension - Primary    Stable.  Continue current medications.  Medications refilled.      Relevant Medications   furosemide (LASIX) 40 MG tablet   valsartan (DIOVAN) 40 MG tablet   rosuvastatin (CRESTOR) 10 MG tablet     Endocrine   Type 1 diabetes mellitus (HCC)    Continue current insulin regimen.  A1c today.  Rx sent for Dexcom G7.      Relevant Medications   valsartan (DIOVAN) 40 MG tablet   rosuvastatin (CRESTOR) 10 MG tablet   Other Relevant Orders   CMP14+EGFR   Hemoglobin A1c   Microalbumin / creatinine urine ratio     Other   Hyperlipidemia    Lipid panel to assess.  Continue Crestor.      Relevant Medications   furosemide (LASIX) 40 MG tablet   valsartan (DIOVAN) 40 MG tablet   rosuvastatin (CRESTOR) 10 MG tablet   Other Relevant Orders   Lipid panel   Other Visit Diagnoses     Needs flu shot       Relevant Orders   Flu vaccine trivalent PF, 6mos and older(Flulaval,Afluria,Fluarix,Fluzone) (Completed)   Screening for deficiency anemia       Relevant Orders   CBC       Meds ordered this encounter  Medications   furosemide (LASIX) 40 MG tablet    Sig: Take 1 tablet (40 mg total) by mouth daily.    Dispense:  90 tablet    Refill:  0   valsartan (DIOVAN) 40 MG tablet    Sig: TAKE 1 TABLET BY MOUTH ONCE DAILY FOR BLOOD  PRESSURE AND FOR DIABETES    Dispense:  90 tablet    Refill:  0   rosuvastatin (CRESTOR) 10 MG tablet    Sig:  Take 1 tablet (10 mg total) by mouth daily.    Dispense:  90 tablet    Refill:  3   Continuous Glucose Sensor (DEXCOM G7 SENSOR) MISC    Sig: Use to check blood sugar continuously. Change every 10 days.    Dispense:  9 each    Refill:  3    Follow-up:  Return in about 6 months (around 08/11/2023) for Diabetes follow up, HTN follow up.  Everlene Other DO [facility]

## 2023-02-10 NOTE — Patient Instructions (Signed)
Labs today.  Follow up in 6 months.  Take care  Dr. Alyxis Grippi  

## 2023-02-11 ENCOUNTER — Ambulatory Visit: Payer: 59 | Admitting: Vascular Surgery

## 2023-02-11 ENCOUNTER — Encounter: Payer: Self-pay | Admitting: Vascular Surgery

## 2023-02-11 ENCOUNTER — Ambulatory Visit (HOSPITAL_COMMUNITY)
Admission: RE | Admit: 2023-02-11 | Discharge: 2023-02-11 | Disposition: A | Discharge status: Home / Self Care | Payer: 59 | Source: Ambulatory Visit | Attending: Vascular Surgery | Admitting: Vascular Surgery

## 2023-02-11 VITALS — BP 91/59 | HR 77 | Temp 98.3°F | Resp 20 | Ht 63.0 in | Wt 250.0 lb

## 2023-02-11 DIAGNOSIS — I872 Venous insufficiency (chronic) (peripheral): Secondary | ICD-10-CM | POA: Insufficient documentation

## 2023-02-11 LAB — CMP14+EGFR
ALT: 18 [IU]/L (ref 0–32)
AST: 22 [IU]/L (ref 0–40)
Albumin: 4.5 g/dL (ref 3.9–4.9)
Alkaline Phosphatase: 98 [IU]/L (ref 44–121)
BUN/Creatinine Ratio: 18 (ref 9–23)
BUN: 16 mg/dL (ref 6–24)
Bilirubin Total: 0.6 mg/dL (ref 0.0–1.2)
CO2: 25 mmol/L (ref 20–29)
Calcium: 9.3 mg/dL (ref 8.7–10.2)
Chloride: 98 mmol/L (ref 96–106)
Creatinine, Ser: 0.87 mg/dL (ref 0.57–1.00)
Globulin, Total: 2.9 g/dL (ref 1.5–4.5)
Glucose: 178 mg/dL — ABNORMAL HIGH (ref 70–99)
Potassium: 4 mmol/L (ref 3.5–5.2)
Sodium: 137 mmol/L (ref 134–144)
Total Protein: 7.4 g/dL (ref 6.0–8.5)
eGFR: 85 mL/min/{1.73_m2} (ref >59)

## 2023-02-11 LAB — LIPID PANEL
Chol/HDL Ratio: 3.1 ratio (ref 0.0–4.4)
Cholesterol, Total: 184 mg/dL (ref 100–199)
HDL: 59 mg/dL (ref >39)
LDL Chol Calc (NIH): 102 mg/dL — ABNORMAL HIGH (ref 0–99)
Triglycerides: 134 mg/dL (ref 0–149)
VLDL Cholesterol Cal: 23 mg/dL (ref 5–40)

## 2023-02-11 LAB — CBC
Hematocrit: 42.2 % (ref 34.0–46.6)
Hemoglobin: 14.1 g/dL (ref 11.1–15.9)
MCH: 30.1 pg (ref 26.6–33.0)
MCHC: 33.4 g/dL (ref 31.5–35.7)
MCV: 90 fL (ref 79–97)
Platelets: 344 10*3/uL (ref 150–450)
RBC: 4.68 x10E6/uL (ref 3.77–5.28)
RDW: 12.8 % (ref 11.7–15.4)
WBC: 11.4 10*3/uL — ABNORMAL HIGH (ref 3.4–10.8)

## 2023-02-11 LAB — MICROALBUMIN / CREATININE URINE RATIO
Creatinine, Urine: 25.4 mg/dL
Microalb/Creat Ratio: <12 mg/g{creat} (ref 0–29)
Microalbumin, Urine: <3 ug/mL

## 2023-02-11 LAB — HEMOGLOBIN A1C
Est. average glucose Bld gHb Est-mCnc: 166 mg/dL
Hgb A1c MFr Bld: 7.4 % — ABNORMAL HIGH (ref 4.8–5.6)

## 2023-02-14 ENCOUNTER — Encounter: Payer: Self-pay | Admitting: Family Medicine

## 2023-02-14 ENCOUNTER — Other Ambulatory Visit: Payer: Self-pay | Admitting: Family Medicine

## 2023-02-22 ENCOUNTER — Ambulatory Visit: Payer: 59 | Admitting: Adult Health

## 2023-02-24 ENCOUNTER — Ambulatory Visit: Payer: 59 | Admitting: Adult Health

## 2023-02-26 ENCOUNTER — Ambulatory Visit (INDEPENDENT_AMBULATORY_CARE_PROVIDER_SITE_OTHER): Payer: 59 | Admitting: Family Medicine

## 2023-02-26 VITALS — BP 99/65 | HR 67 | Temp 97.5°F | Ht 63.0 in | Wt 255.6 lb

## 2023-02-26 DIAGNOSIS — H9202 Otalgia, left ear: Secondary | ICD-10-CM

## 2023-02-26 MED ORDER — CIPROFLOXACIN-DEXAMETHASONE 0.3-0.1 % OT SUSP: 4.0000 [drp] | Freq: Two times a day (BID) | OTIC | 0 refills | Status: AC

## 2023-02-26 MED ORDER — FLUTICASONE PROPIONATE 50 MCG/ACT NA SUSP: 2.0000 | Freq: Every day | NASAL | 6 refills | Status: DC

## 2023-02-26 NOTE — Progress Notes (Signed)
Subjective:  Patient ID: Kimberly Becker, female    DOB: Aug 15, 1980  Age: 42 y.o. MRN: [id number]  CC: Left ear pain   HPI:  42 year old female presents with left ear pain.  Started yesterday.  Stabbing pain.  She has had some yellow drainage from the ear.  No respiratory symptoms.  No relieving factors.  No other associated symptoms.  No other complaints.  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 05/13/2021   Lower extremity edema 12/21/2020   Gastroesophageal reflux disease without esophagitis 09/19/2020   Menstrual periods irregular 01/16/2020   Type 1 diabetes mellitus (HCC) 09/26/2014   Anxiety 06/26/2013   Hyperlipidemia 03/20/2013   Morbid obesity (HCC) 11/08/2012   Migraines 08/10/2012    Social Hx   Social History   Socioeconomic History   Marital status: Married    Spouse name: Not on file   Number of children: Not on file   Years of education: 14   Highest education level: Associate degree: academic program  Occupational History   Occupation: Manufacturing systems engineer  Tobacco Use   Smoking status: Never   Smokeless tobacco: Never  Vaping Use   Vaping status: Never Used  Substance and Sexual Activity   Alcohol use: No   Drug use: No   Sexual activity: Yes    Birth control/protection: None    Comment: heterosexual relationship  Other Topics Concern   Not on file  Social History Narrative   Not on file   Social Determinants of Health   Financial Resource Strain: Low Risk  (08/10/2022)   Overall Financial Resource Strain (CARDIA)    Difficulty of Paying Living Expenses: Not very hard  Food Insecurity: No Food Insecurity (08/10/2022)   Hunger Vital Sign    Worried About Running Out of Food in the Last Year: Never true    Ran Out of Food in the Last Year: Never true  Transportation Needs: No Transportation Needs (08/10/2022)    PRAPARE - Administrator, Civil Service (Medical): No    Lack of Transportation (Non-Medical): No  Physical Activity: Unknown (08/10/2022)   Exercise Vital Sign    Days of Exercise per Week: 2 days    Minutes of Exercise per Session: Not on file  Stress: No Stress Concern Present (08/10/2022)   [employer] of Occupational Health - Occupational Stress Questionnaire    Feeling of Stress : Only a little  Social Connections: Unknown (08/10/2022)   Social Connection and Isolation Panel [NHANES]    Frequency of Communication with Friends and Family: More than three times a week    Frequency of Social Gatherings with Friends and Family: Once a week    Attends Religious Services: Patient declined    Database administrator or Organizations: No    Attends Engineer, structural: Not on file    Marital Status: Married    Review of Systems Per HPI  Objective:  BP 99/65   Pulse 67   Temp (!) 97.5 F (36.4 C)   Ht 5\' 3"  (1.6 m)   Wt 255 lb 9.6 oz (115.9 kg)   SpO2 97%   BMI 45.28 kg/m      02/26/2023    9:21 AM 02/11/2023    2:42 PM 02/10/2023   10:27 AM  BP/Weight  Systolic BP 99 91 118  Diastolic BP 65 59 58  Wt. (Lbs) 255.6 250 252.6  BMI 45.28 kg/m2 44.29 kg/m2 44.75 kg/m2    Physical Exam Constitutional:      General: She is not in acute distress.    Appearance: Normal appearance.  HENT:     Head: Normocephalic and atraumatic.     Ears:     Comments: Left ear -no evidence of otitis media or effusion.  Mild amount of wax in the left ear. Neurological:     Mental Status: She is alert.     Lab Results  Component Value Date   WBC 11.4 (H) 02/10/2023   HGB 14.1 02/10/2023   HCT 42.2 02/10/2023   PLT 344 02/10/2023   GLUCOSE 178 (H) 02/10/2023   CHOL 184 02/10/2023   TRIG 134 02/10/2023   HDL 59 02/10/2023   LDLCALC 102 (H) 02/10/2023   ALT 18 02/10/2023   AST 22 02/10/2023   NA 137 02/10/2023   K 4.0 02/10/2023   CL 98 02/10/2023    CREATININE 0.87 02/10/2023   BUN 16 02/10/2023   CO2 25 02/10/2023   TSH 1.810 08/19/2021   HGBA1C 7.4 (H) 02/10/2023   MICROALBUR 0.2 05/18/2014     Assessment & Plan:   42 year old female presents with left ear pain.  Suspect eustachian tube dysfunction.  Flonase as directed.  Also placing on Ciprodex to cover for the possibility of otitis externa.  No evidence of otitis media on exam.  Meds ordered this encounter  Medications   fluticasone (FLONASE) 50 MCG/ACT nasal spray    Sig: Place 2 sprays into both nostrils daily.    Dispense:  16 g    Refill:  6   ciprofloxacin-dexamethasone (CIPRODEX) OTIC suspension    Sig: Place 4 drops into the left ear 2 (two) times daily for 7 days.    Dispense:  7.5 mL    Refill:  0    Follow-up:  Return if symptoms worsen or fail to improve.  Everlene Other DO [facility]

## 2023-02-26 NOTE — Patient Instructions (Signed)
Medications as prescribed. ° °Take care ° °Dr. Allon Costlow  °

## 2023-03-12 ENCOUNTER — Other Ambulatory Visit: Payer: Self-pay | Admitting: Family Medicine

## 2023-03-15 ENCOUNTER — Encounter: Payer: Self-pay | Admitting: Family Medicine

## 2023-03-15 ENCOUNTER — Ambulatory Visit (INDEPENDENT_AMBULATORY_CARE_PROVIDER_SITE_OTHER): Payer: 59 | Admitting: Family Medicine

## 2023-03-15 VITALS — BP 109/68 | HR 63 | Ht 63.0 in | Wt 256.0 lb

## 2023-03-15 DIAGNOSIS — J0100 Acute maxillary sinusitis, unspecified: Secondary | ICD-10-CM | POA: Insufficient documentation

## 2023-03-15 MED ORDER — AMOXICILLIN-POT CLAVULANATE 875-125 MG PO TABS: 1.0000 | ORAL_TABLET | Freq: Two times a day (BID) | ORAL | 0 refills | Status: DC

## 2023-03-15 NOTE — Progress Notes (Signed)
Subjective:  Patient ID: Kimberly Becker, female    DOB: 07-03-1980  Age: 42 y.o. MRN: [id number]  CC:   Chief Complaint  Patient presents with   Headache    Sinus pain/ pressure, green mucus, fever 101 today    HPI:  42 year old female presents for evaluation of the above.  Symptoms started on Thursday.  She reports headache and severe sinus pressure and pain.  She states that she is having green nasal discharge.  Associated fever as well.  She has been using Tylenol, Sudafed without relief.  She has also been using Flonase without relief.  No reported sick contacts.  No other associated symptoms.  No other complaints.  Patient denies testing for COVID-19 and declines this today.  Patient Active Problem List   Diagnosis Date Noted   Acute maxillary sinusitis 03/15/2023   Essential hypertension 05/05/2022   Abnormal Papanicolaou smear of cervix with positive human papilloma virus (HPV) test 05/13/2021   Lower extremity edema 12/21/2020   Gastroesophageal reflux disease without esophagitis 09/19/2020   Menstrual periods irregular 01/16/2020   Type 1 diabetes mellitus (HCC) 09/26/2014   Anxiety 06/26/2013   Hyperlipidemia 03/20/2013   Morbid obesity (HCC) 11/08/2012   Migraines 08/10/2012    Social Hx   Social History   Socioeconomic History   Marital status: Married    Spouse name: Not on file   Number of children: Not on file   Years of education: 14   Highest education level: Associate degree: academic program  Occupational History   Occupation: Manufacturing systems engineer  Tobacco Use   Smoking status: Never   Smokeless tobacco: Never  Vaping Use   Vaping status: Never Used  Substance and Sexual Activity   Alcohol use: No   Drug use: No   Sexual activity: Yes    Birth control/protection: None    Comment: heterosexual relationship  Other Topics Concern   Not on file  Social History Narrative   Not on file   Social Determinants of Health   Financial Resource  Strain: Low Risk  (08/10/2022)   Overall Financial Resource Strain (CARDIA)    Difficulty of Paying Living Expenses: Not very hard  Food Insecurity: No Food Insecurity (08/10/2022)   Hunger Vital Sign    Worried About Running Out of Food in the Last Year: Never true    Ran Out of Food in the Last Year: Never true  Transportation Needs: No Transportation Needs (08/10/2022)   PRAPARE - Administrator, Civil Service (Medical): No    Lack of Transportation (Non-Medical): No  Physical Activity: Unknown (08/10/2022)   Exercise Vital Sign    Days of Exercise per Week: 2 days    Minutes of Exercise per Session: Not on file  Stress: No Stress Concern Present (08/10/2022)   [employer] of Occupational Health - Occupational Stress Questionnaire    Feeling of Stress : Only a little  Social Connections: Unknown (08/10/2022)   Social Connection and Isolation Panel [NHANES]    Frequency of Communication with Friends and Family: More than three times a week    Frequency of Social Gatherings with Friends and Family: Once a week    Attends Religious Services: Patient declined    Database administrator or Organizations: No    Attends Engineer, structural: Not on file    Marital Status: Married    Review of Systems Per HPI  Objective:  BP 109/68   Pulse 63  Ht 5\' 3"  (1.6 m)   Wt 256 lb (116.1 kg)   SpO2 99%   BMI 45.35 kg/m      03/15/2023   11:10 AM 02/26/2023    9:21 AM 02/11/2023    2:42 PM  BP/Weight  Systolic BP 109 99 91  Diastolic BP 68 65 59  Wt. (Lbs) 256 255.6 250  BMI 45.35 kg/m2 45.28 kg/m2 44.29 kg/m2    Physical Exam Vitals and nursing note reviewed.  Constitutional:      General: She is not in acute distress.    Appearance: She is obese.  HENT:     Head: Normocephalic and atraumatic.     Right Ear: Tympanic membrane normal.     Left Ear: Tympanic membrane normal.     Nose:     Comments: Severe bilateral maxillary sinus tenderness to  palpation.    Mouth/Throat:     Pharynx: Oropharynx is clear.  Cardiovascular:     Rate and Rhythm: Normal rate and regular rhythm.  Pulmonary:     Effort: Pulmonary effort is normal.     Breath sounds: Normal breath sounds. No wheezing or rales.  Neurological:     Mental Status: She is alert.     Lab Results  Component Value Date   WBC 11.4 (H) 02/10/2023   HGB 14.1 02/10/2023   HCT 42.2 02/10/2023   PLT 344 02/10/2023   GLUCOSE 178 (H) 02/10/2023   CHOL 184 02/10/2023   TRIG 134 02/10/2023   HDL 59 02/10/2023   LDLCALC 102 (H) 02/10/2023   ALT 18 02/10/2023   AST 22 02/10/2023   NA 137 02/10/2023   K 4.0 02/10/2023   CL 98 02/10/2023   CREATININE 0.87 02/10/2023   BUN 16 02/10/2023   CO2 25 02/10/2023   TSH 1.810 08/19/2021   HGBA1C 7.4 (H) 02/10/2023   MICROALBUR 0.2 05/18/2014     Assessment & Plan:   Problem List Items Addressed This Visit       Respiratory   Acute maxillary sinusitis - Primary    Acute illness with systemic symptoms given fever.  Treating with Augmentin.  Patient declined COVID testing      Relevant Medications   amoxicillin-clavulanate (AUGMENTIN) 875-125 MG tablet    Meds ordered this encounter  Medications   amoxicillin-clavulanate (AUGMENTIN) 875-125 MG tablet    Sig: Take 1 tablet by mouth 2 (two) times daily.    Dispense:  20 tablet    Refill:  0    Follow-up:  Return if symptoms worsen or fail to improve.  Everlene Other DO [facility]

## 2023-03-15 NOTE — Assessment & Plan Note (Addendum)
Acute illness with systemic symptoms given fever.  Treating with Augmentin.  Patient declined COVID testing

## 2023-03-22 ENCOUNTER — Encounter: Payer: Self-pay | Admitting: Family Medicine

## 2023-03-22 ENCOUNTER — Other Ambulatory Visit: Payer: Self-pay | Admitting: Family Medicine

## 2023-03-22 NOTE — Telephone Encounter (Signed)
Everlene Other G, DO     She needs to be re-evaluated.

## 2023-03-26 ENCOUNTER — Encounter: Payer: Self-pay | Admitting: Family Medicine

## 2023-03-28 ENCOUNTER — Other Ambulatory Visit: Payer: Self-pay | Admitting: Family Medicine

## 2023-03-28 MED ORDER — ROSUVASTATIN CALCIUM 20 MG PO TABS: 20.0000 mg | ORAL_TABLET | Freq: Every day | ORAL | 3 refills | Status: DC

## 2023-04-18 ENCOUNTER — Emergency Department (HOSPITAL_COMMUNITY)
Admission: EM | Admit: 2023-04-18 | Discharge: 2023-04-18 | Disposition: A | Discharge status: Home / Self Care | Payer: 59 | Attending: Emergency Medicine | Admitting: Emergency Medicine

## 2023-04-18 ENCOUNTER — Encounter (HOSPITAL_COMMUNITY): Payer: Self-pay | Admitting: *Deleted

## 2023-04-18 ENCOUNTER — Emergency Department (HOSPITAL_COMMUNITY): Payer: 59

## 2023-04-18 ENCOUNTER — Encounter: Payer: Self-pay | Admitting: Family Medicine

## 2023-04-18 ENCOUNTER — Other Ambulatory Visit: Payer: Self-pay

## 2023-04-18 DIAGNOSIS — R059 Cough, unspecified: Secondary | ICD-10-CM | POA: Diagnosis not present

## 2023-04-18 DIAGNOSIS — Z7982 Long term (current) use of aspirin: Secondary | ICD-10-CM | POA: Insufficient documentation

## 2023-04-18 DIAGNOSIS — Z20822 Contact with and (suspected) exposure to covid-19: Secondary | ICD-10-CM | POA: Insufficient documentation

## 2023-04-18 DIAGNOSIS — R509 Fever, unspecified: Secondary | ICD-10-CM | POA: Diagnosis not present

## 2023-04-18 DIAGNOSIS — J101 Influenza due to other identified influenza virus with other respiratory manifestations: Secondary | ICD-10-CM | POA: Diagnosis not present

## 2023-04-18 DIAGNOSIS — E109 Type 1 diabetes mellitus without complications: Secondary | ICD-10-CM | POA: Diagnosis not present

## 2023-04-18 DIAGNOSIS — J111 Influenza due to unidentified influenza virus with other respiratory manifestations: Secondary | ICD-10-CM

## 2023-04-18 LAB — CBC WITH DIFFERENTIAL/PLATELET
Abs Immature Granulocytes: 0.04 10*3/uL (ref 0.00–0.07)
Basophils Absolute: 0.1 10*3/uL (ref 0.0–0.1)
Basophils Relative: 1 %
Eosinophils Absolute: 0 10*3/uL (ref 0.0–0.5)
Eosinophils Relative: 0 %
HCT: 41.2 % (ref 36.0–46.0)
Hemoglobin: 13.6 g/dL (ref 12.0–15.0)
Immature Granulocytes: 0 %
Lymphocytes Relative: 15 %
Lymphs Abs: 2 10*3/uL (ref 0.7–4.0)
MCH: 29.2 pg (ref 26.0–34.0)
MCHC: 33 g/dL (ref 30.0–36.0)
MCV: 88.6 fL (ref 80.0–100.0)
Monocytes Absolute: 1.3 10*3/uL — ABNORMAL HIGH (ref 0.1–1.0)
Monocytes Relative: 10 %
Neutro Abs: 9.9 10*3/uL — ABNORMAL HIGH (ref 1.7–7.7)
Neutrophils Relative %: 74 %
Platelets: 270 10*3/uL (ref 150–400)
RBC: 4.65 MIL/uL (ref 3.87–5.11)
RDW: 13 % (ref 11.5–15.5)
WBC: 13.3 10*3/uL — ABNORMAL HIGH (ref 4.0–10.5)
nRBC: 0 % (ref 0.0–0.2)

## 2023-04-18 LAB — COMPREHENSIVE METABOLIC PANEL
ALT: 24 U/L (ref 0–44)
AST: 25 U/L (ref 15–41)
Albumin: 3.9 g/dL (ref 3.5–5.0)
Alkaline Phosphatase: 73 U/L (ref 38–126)
Anion gap: 12 (ref 5–15)
BUN: 12 mg/dL (ref 6–20)
CO2: 23 mmol/L (ref 22–32)
Calcium: 9.3 mg/dL (ref 8.9–10.3)
Chloride: 101 mmol/L (ref 98–111)
Creatinine, Ser: 1.08 mg/dL — ABNORMAL HIGH (ref 0.44–1.00)
GFR, Estimated: >60 mL/min (ref >60)
Glucose, Bld: 158 mg/dL — ABNORMAL HIGH (ref 70–99)
Potassium: 3.3 mmol/L — ABNORMAL LOW (ref 3.5–5.1)
Sodium: 136 mmol/L (ref 135–145)
Total Bilirubin: 0.8 mg/dL (ref 0.0–1.2)
Total Protein: 7.9 g/dL (ref 6.5–8.1)

## 2023-04-18 LAB — RESP PANEL BY RT-PCR (RSV, FLU A&B, COVID)  RVPGX2
Influenza A by PCR: POSITIVE — AB
Influenza B by PCR: NEGATIVE
Resp Syncytial Virus by PCR: NEGATIVE
SARS Coronavirus 2 by RT PCR: NEGATIVE

## 2023-04-18 LAB — LACTIC ACID, PLASMA: Lactic Acid, Venous: 0.9 mmol/L (ref 0.5–1.9)

## 2023-04-18 LAB — LIPASE, BLOOD: Lipase: 34 U/L (ref 11–51)

## 2023-04-18 MED ORDER — SODIUM CHLORIDE 0.9 % IV BOLUS
1000.0000 mL | Freq: Once | INTRAVENOUS | Status: AC
  Administered 2023-04-18: 1000 mL via INTRAVENOUS

## 2023-04-18 MED ORDER — ONDANSETRON HCL 4 MG/2ML IJ SOLN
4.0000 mg | Freq: Once | INTRAMUSCULAR | Status: AC
  Administered 2023-04-18: 4 mg via INTRAVENOUS
  Filled 2023-04-18: qty 2

## 2023-04-18 MED ORDER — ONDANSETRON 4 MG PO TBDP: 4.0000 mg | ORAL_TABLET | Freq: Three times a day (TID) | ORAL | 0 refills | Status: AC | PRN

## 2023-04-18 NOTE — ED Notes (Signed)
 Unable to get IV on patient. Charge RN Lequita Halt made aware and coming to start IV.

## 2023-04-18 NOTE — ED Provider Notes (Signed)
 [place] EMERGENCY DEPARTMENT AT [facility] Provider Note   CSN: [id number] Arrival date & time: 04/18/23  9083     History  Chief Complaint  Patient presents with   Fever    Kimberly Becker is a 43 y.o. female.   Fever Patient with fever nasal congestion cough nausea vomiting diarrhea.  States she got norovirus around Christmas time and been doing better for couple days but now developed nausea and vomiting again.  Also diarrhea.  Has had fevers up to 103 at home.  Took Tylenol .  States her child has URI and had reported negative viral testing.  Patient states she feels weak and does not have much of an appetite.  Sugars up to 160 at home and states went down to 60 last night.  She is a type I diabetic.    Past Medical History:  Diagnosis Date   Abnormal Papanicolaou smear of cervix with positive human papilloma virus (HPV) test 05/13/2021   05/13/21 pap negative for malignancy, and HPV 16/18/45, but +HPV other, repeat in 1 year per ASCCP guidelines, 5 year risk for CIN 3+ is 4.8%    Anxiety    History of chickenpox    Migraine headache    PCOS (polycystic ovarian syndrome)    Type I (juvenile type) diabetes mellitus without mention of complication, not stated as uncontrolled     Home Medications Prior to Admission medications   Medication Sig Start Date End Date Taking? Authorizing Provider  ondansetron  (ZOFRAN -ODT) 4 MG disintegrating tablet Take 1 tablet (4 mg total) by mouth every 8 (eight) hours as needed for nausea or vomiting. 04/18/23  Yes Patsey Lot, MD  amoxicillin -clavulanate (AUGMENTIN ) 875-125 MG tablet Take 1 tablet by mouth 2 (two) times daily. 03/15/23   Cook, Jayce G, DO  aspirin EC 81 MG tablet Take 81 mg by mouth daily. Swallow whole.    [provider]  Continuous Blood Gluc Transmit (DEXCOM G6 TRANSMITTER) MISC Apply as directed with sensor for 3 months 05/18/22   Cook, Jayce G, DO  Continuous Glucose Sensor (DEXCOM G7 SENSOR) MISC  Use to check blood sugar continuously. Change every 10 days. 02/10/23   Cook, Jayce G, DO  fluticasone  (FLONASE ) 50 MCG/ACT nasal spray Place 2 sprays into both nostrils daily. 02/26/23   Cook, Jayce G, DO  furosemide  (LASIX ) 40 MG tablet Take 1 tablet (40 mg total) by mouth daily. 02/10/23   Cook, Jayce G, DO  insulin  aspart (NOVOLOG ) 100 UNIT/ML FlexPen INJECT 0-20 UNITS SUBCUTANEOUSLY 4 TIMES DAILY BEFORE MEALS AND AT BEDTIME AS PER SLIDING SCALE, 01/25/23   Mauro Elveria BROCKS, NP  insulin  detemir (LEVEMIR ) 100 UNIT/ML FlexPen Inject 20 units into the skin qhs 08/11/22   Cook, Jayce G, DO  Multiple Vitamin (MULTIVITAMIN WITH MINERALS) TABS tablet Take 1 tablet by mouth daily.    [provider]  OVER THE COUNTER MEDICATION Omega red    [provider]  potassium chloride  (KLOR-CON ) 10 MEQ tablet Take 2 tablets by mouth once daily 02/15/23   Cook, Jayce G, DO  rosuvastatin  (CRESTOR ) 20 MG tablet Take 1 tablet (20 mg total) by mouth daily. 03/28/23   Cook, Jayce G, DO  traZODone  (DESYREL ) 100 MG tablet TAKE 1 TABLET BY MOUTH AT BEDTIME 03/15/23   Cook, Jayce G, DO  valsartan  (DIOVAN ) 40 MG tablet TAKE 1 TABLET BY MOUTH ONCE DAILY FOR BLOOD PRESSURE AND FOR DIABETES 02/10/23   Cook, Jayce G, DO  Allergies    Patient has no known allergies.    Review of Systems   Review of Systems  Constitutional:  Positive for fever.    Physical Exam Updated Vital Signs BP 97/60   Pulse 82   Temp 98.5 F (36.9 C) (Oral)   Resp 18   Ht 5' 3 (1.6 m)   Wt 117.5 kg   LMP 04/06/2023   SpO2 96%   BMI 45.88 kg/m  Physical Exam Vitals and nursing note reviewed.  HENT:     Head: Normocephalic.  Cardiovascular:     Rate and Rhythm: Tachycardia present.  Pulmonary:     Breath sounds: No wheezing.  Chest:     Chest wall: No tenderness.  Abdominal:     Tenderness: There is abdominal tenderness.     Comments: Mild upper abdominal tenderness without rebound or guarding.  No hernia  palpated  Musculoskeletal:     Cervical back: Neck supple.  Skin:    Coloration: Skin is not jaundiced.  Neurological:     Mental Status: She is alert and oriented to person, place, and time.     ED Results / Procedures / Treatments   Labs (all labs ordered are listed, but only abnormal results are displayed) Labs Reviewed  RESP PANEL BY RT-PCR (RSV, FLU A&B, COVID)  RVPGX2 - Abnormal; Notable for the following components:      Result Value   Influenza A by PCR POSITIVE (*)    All other components within normal limits  COMPREHENSIVE METABOLIC PANEL - Abnormal; Notable for the following components:   Potassium 3.3 (*)    Glucose, Bld 158 (*)    Creatinine, Ser 1.08 (*)    All other components within normal limits  CBC WITH DIFFERENTIAL/PLATELET - Abnormal; Notable for the following components:   WBC 13.3 (*)    Neutro Abs 9.9 (*)    Monocytes Absolute 1.3 (*)    All other components within normal limits  CULTURE, BLOOD (ROUTINE X 2)  CULTURE, BLOOD (ROUTINE X 2) W REFLEX TO ID PANEL  CULTURE, BLOOD (ROUTINE X 2)  LIPASE, BLOOD  LACTIC ACID, PLASMA    EKG None  Radiology DG Chest Portable 1 View Result Date: 04/18/2023 CLINICAL DATA:  Cough EXAM: PORTABLE CHEST 1 VIEW COMPARISON:  Chest radiograph dated 05/04/2018 FINDINGS: The heart size and mediastinal contours are within normal limits. Both lungs are clear. The visualized skeletal structures are unremarkable. IMPRESSION: No active disease. Electronically Signed   By: Norman Hopper M.D.   On: 04/18/2023 13:03    Procedures Procedures    Medications Ordered in ED Medications  sodium chloride  0.9 % bolus 1,000 mL (0 mLs Intravenous Stopped 04/18/23 1415)  ondansetron  (ZOFRAN ) injection 4 mg (4 mg Intravenous Given 04/18/23 1223)    ED Course/ Medical Decision Making/ A&P                                 Medical Decision Making Amount and/or Complexity of Data Reviewed Labs: ordered. Radiology:  ordered.  Risk Prescription drug management.   Patient with nausea and vomiting abdominal pain.  Also URI symptoms and reported fevers up to 102 at home.  Likely has component of dehydration with recent nausea vomiting and decreased oral intake.  However does have some mild hypotension with pressure of 90/60.  Will give fluid bolus.  Will get more blood work due to the fever at home and hypotension.  Will not empirically start antibiotics however.  Blood pressure improved.  Flu positive.  Feeling better after fluids.  Tolerated orals.  Will discharge home.        Final Clinical Impression(s) / ED Diagnoses Final diagnoses:  Influenza    Rx / DC Orders ED Discharge Orders          Ordered    ondansetron  (ZOFRAN -ODT) 4 MG disintegrating tablet  Every 8 hours PRN        04/18/23 1407              Patsey Lot, MD 04/18/23 1500

## 2023-04-18 NOTE — ED Triage Notes (Signed)
 Pt with nasal congestion, green in color. + cough NP mostly + fever, took tylenol earlier after 0800, + emesis + diarrhea + HA.  Started yesterday morning

## 2023-04-19 ENCOUNTER — Other Ambulatory Visit: Payer: Self-pay | Admitting: Family Medicine

## 2023-04-19 MED ORDER — PROMETHAZINE-DM 6.25-15 MG/5ML PO SYRP: 5.0000 mL | ORAL_SOLUTION | Freq: Four times a day (QID) | ORAL | 0 refills | Status: DC | PRN

## 2023-04-21 ENCOUNTER — Encounter: Payer: Self-pay | Admitting: Family Medicine

## 2023-04-22 ENCOUNTER — Ambulatory Visit: Payer: Self-pay | Admitting: Family Medicine

## 2023-04-22 ENCOUNTER — Ambulatory Visit (INDEPENDENT_AMBULATORY_CARE_PROVIDER_SITE_OTHER): Payer: 59 | Admitting: Family Medicine

## 2023-04-22 ENCOUNTER — Ambulatory Visit (HOSPITAL_COMMUNITY)
Admission: RE | Admit: 2023-04-22 | Discharge: 2023-04-22 | Disposition: A | Discharge status: Home / Self Care | Payer: 59 | Source: Ambulatory Visit | Attending: Family Medicine | Admitting: Family Medicine

## 2023-04-22 VITALS — BP 142/67 | HR 92 | Temp 97.7°F | Ht 63.0 in | Wt 248.6 lb

## 2023-04-22 DIAGNOSIS — R0602 Shortness of breath: Secondary | ICD-10-CM | POA: Diagnosis not present

## 2023-04-22 DIAGNOSIS — R051 Acute cough: Secondary | ICD-10-CM

## 2023-04-22 DIAGNOSIS — J988 Other specified respiratory disorders: Secondary | ICD-10-CM | POA: Insufficient documentation

## 2023-04-22 DIAGNOSIS — R058 Other specified cough: Secondary | ICD-10-CM | POA: Diagnosis not present

## 2023-04-22 MED ORDER — DOXYCYCLINE HYCLATE 100 MG PO TABS: 100.0000 mg | ORAL_TABLET | Freq: Two times a day (BID) | ORAL | 0 refills | Status: DC

## 2023-04-22 MED ORDER — HYDROCOD POLI-CHLORPHE POLI ER 10-8 MG/5ML PO SUER: 5.0000 mL | Freq: Two times a day (BID) | ORAL | 0 refills | Status: DC | PRN

## 2023-04-22 NOTE — Patient Instructions (Signed)
 Medication as directed.  Xray today.   We will call with the results.  Take care  Dr. Adriana Simas

## 2023-04-22 NOTE — Telephone Encounter (Signed)
 Copied from CRM [id number]. Topic: Clinical - Red Word Triage >> Apr 22, 2023  8:24 AM Alfonso ORN wrote: Red Word that prompted transfer to Nurse Triage: breathing   issue   Chief Complaint: Difficulty breathing Symptoms: difficulty  Frequency: x 1 day but flu altogether for 4 days Pertinent Negatives: Patient denies dizziness, diarrhea, light headedness, fever Disposition: [] ED /[] Urgent Care (no appt availability in office) / [x] Appointment(In office/virtual)/ []  [facility] [place] [facility]/ [] Home Care/ [] Refused Recommended Disposition /[] [facility]/ []  Follow-up with PCP Additional Notes: Patient went to hospital Sunday (4 days ago) and diagnosed with the flu.  Fever has gotten better but breathing has become difficult.  She denies any fever, diarrhea, lightheadedness, or dizziness.  Patient states that she started coughing really bad yesterday and today she feels much more congested and short winded.  She states that cough medicine is not helping and she can barely breathe out of her nose.  She advised that she messaged her PCP last night and he advised her that if she did not have a fever and could ambulate then he could see her in the office.  This RN verified this information in the notes of the patient's chart.  Patient states that she feels fine other than being short winded and doesn't feel like her heart is beating fast so an appointment was made in the office with her PCP today 04/22/2023 at 10:40am.  Patient is also advised that if anything worsens to go to the emergency room or call an ambulance.  Patient verbalized understanding.  Reason for Disposition  [1] MILD difficulty breathing (e.g., minimal/no SOB at rest, SOB with walking, pulse <100) AND [2] NEW-onset or WORSE than normal  Answer Assessment - Initial Assessment Questions 1. RESPIRATORY STATUS: Describe your breathing? (e.g., wheezing, shortness of breath, unable to speak, severe coughing)      Very short of breath 2.  ONSET: When did this breathing problem begin?      1 day ago--patient started coughing really bad 3. PATTERN Does the difficult breathing come and go, or has it been constant since it started?      constant 4. SEVERITY: How bad is your breathing? (e.g., mild, moderate, severe)    - MILD: No SOB at rest, mild SOB with walking, speaks normally in sentences, can lie down, no retractions, pulse < 100.    - MODERATE: SOB at rest, SOB with minimal exertion and prefers to sit, cannot lie down flat, speaks in phrases, mild retractions, audible wheezing, pulse 100-120.    - SEVERE: Very SOB at rest, speaks in single words, struggling to breathe, sitting hunched forward, retractions, pulse > 120     Patient very short of breath--cannot walk far 5. RECURRENT SYMPTOM: Have you had difficulty breathing before? If Yes, ask: When was the last time? and What happened that time?      No 6. CARDIAC HISTORY: Do you have any history of heart disease? (e.g., heart attack, angina, bypass surgery, angioplasty)      No 7. LUNG HISTORY: Do you have any history of lung disease?  (e.g., pulmonary embolus, asthma, emphysema)     Not since very young 8. CAUSE: What do you think is causing the breathing problem?      Flu 9. OTHER SYMPTOMS: Do you have any other symptoms? (e.g., dizziness, runny nose, cough, chest pain, fever)     Cough, nasal congestion---dark green phlegm 10. O2 SATURATION MONITOR:  Do you use an oxygen saturation monitor (  pulse oximeter) at home? If Yes, ask: What is your reading (oxygen level) today? What is your usual oxygen saturation reading? (e.g., 95%)       No 11. PREGNANCY: Is there any chance you are pregnant? When was your last menstrual period?       No 12. TRAVEL: Have you traveled out of the country in the last month? (e.g., travel history, exposures)       No  Protocols used: Breathing Difficulty-A-AH

## 2023-04-22 NOTE — Progress Notes (Signed)
 Subjective:  Patient ID: Kimberly Becker, female    DOB: 01-16-1981  Age: 43 y.o. MRN: [id number]  CC: Cough, shortness of breath   HPI:  43 year old female presents for evaluation of the above.  Recently seen in the ER on 1/5.  Diagnosed with influenza.  Treated supportively and discharged home.  Was not given antiviral medication.  Patient reports that she continues to feel poorly.  She does note that her fever and bodyaches have improved.  However, she is continuing to have significant cough which is productive of discolored sputum.  She is also having associated shortness of breath.  No relieving factors.  Patient Active Problem List   Diagnosis Date Noted   Respiratory infection 04/22/2023   Essential hypertension 05/05/2022   Abnormal Papanicolaou smear of cervix with positive human papilloma virus (HPV) test 05/13/2021   Lower extremity edema 12/21/2020   Gastroesophageal reflux disease without esophagitis 09/19/2020   Menstrual periods irregular 01/16/2020   Type 1 diabetes mellitus (HCC) 09/26/2014   Anxiety 06/26/2013   Hyperlipidemia 03/20/2013   Morbid obesity (HCC) 11/08/2012   Migraines 08/10/2012    Social Hx   Social History   Socioeconomic History   Marital status: Married    Spouse name: Not on file   Number of children: Not on file   Years of education: 14   Highest education level: Associate degree: academic program  Occupational History   Occupation: Manufacturing Systems Engineer  Tobacco Use   Smoking status: Never   Smokeless tobacco: Never  Vaping Use   Vaping status: Never Used  Substance and Sexual Activity   Alcohol use: No   Drug use: No   Sexual activity: Yes    Birth control/protection: None    Comment: heterosexual relationship  Other Topics Concern   Not on file  Social History Narrative   Not on file   Social Drivers of Health   Financial Resource Strain: Low Risk  (08/10/2022)   Overall Financial Resource Strain (CARDIA)    Difficulty  of Paying Living Expenses: Not very hard  Food Insecurity: No Food Insecurity (08/10/2022)   Hunger Vital Sign    Worried About Running Out of Food in the Last Year: Never true    Ran Out of Food in the Last Year: Never true  Transportation Needs: No Transportation Needs (08/10/2022)   PRAPARE - Administrator, Civil Service (Medical): No    Lack of Transportation (Non-Medical): No  Physical Activity: Unknown (08/10/2022)   Exercise Vital Sign    Days of Exercise per Week: 2 days    Minutes of Exercise per Session: Not on file  Stress: No Stress Concern Present (08/10/2022)   [employer] of Occupational Health - Occupational Stress Questionnaire    Feeling of Stress : Only a little  Social Connections: Unknown (08/10/2022)   Social Connection and Isolation Panel [NHANES]    Frequency of Communication with Friends and Family: More than three times a week    Frequency of Social Gatherings with Friends and Family: Once a week    Attends Religious Services: Patient declined    Database Administrator or Organizations: No    Attends Engineer, Structural: Not on file    Marital Status: Married    Review of Systems Per HPI  Objective:  BP (!) 142/67   Pulse 92   Temp 97.7 F (36.5 C)   Ht 5' 3 (1.6 m)   Wt 248 lb 9.6 oz (112.8 kg)  LMP 04/06/2023   SpO2 99%   BMI 44.04 kg/m      04/22/2023   10:41 AM 04/18/2023    2:00 PM 04/18/2023    1:07 PM  BP/Weight  Systolic BP 142 97 113  Diastolic BP 67 60 54  Wt. (Lbs) 248.6    BMI 44.04 kg/m2      Physical Exam Vitals and nursing note reviewed.  Constitutional:      General: She is not in acute distress.    Appearance: She is obese.  HENT:     Head: Normocephalic and atraumatic.  Cardiovascular:     Rate and Rhythm: Normal rate and regular rhythm.  Pulmonary:     Effort: Pulmonary effort is normal.     Breath sounds: Normal breath sounds. No wheezing, rhonchi or rales.  Neurological:     Mental  Status: She is alert.  Psychiatric:        Mood and Affect: Mood normal.        Behavior: Behavior normal.     Lab Results  Component Value Date   WBC 13.3 (H) 04/18/2023   HGB 13.6 04/18/2023   HCT 41.2 04/18/2023   PLT 270 04/18/2023   GLUCOSE 158 (H) 04/18/2023   CHOL 184 02/10/2023   TRIG 134 02/10/2023   HDL 59 02/10/2023   LDLCALC 102 (H) 02/10/2023   ALT 24 04/18/2023   AST 25 04/18/2023   NA 136 04/18/2023   K 3.3 (L) 04/18/2023   CL 101 04/18/2023   CREATININE 1.08 (H) 04/18/2023   BUN 12 04/18/2023   CO2 23 04/18/2023   TSH 1.810 08/19/2021   HGBA1C 7.4 (H) 02/10/2023   MICROALBUR 0.2 05/18/2014     Assessment & Plan:   Problem List Items Addressed This Visit       Respiratory   Respiratory infection - Primary   Chest x-ray was obtained today and was independently reviewed by me.  Interpretation: Normal x-ray.  No evidence of pneumonia.  Previous x-ray done on 1/5 was independently reviewed today as well.  Interpretation: No acute cardiopulmonary abnormality.  No evidence of pneumonia.  Current x-ray is essentially unchanged and is normal.  Her clinical picture is concerning for secondary bacterial infection given recent influenza.  Tussionex as needed for cough.  Placing on antibiotic therapy to cover for secondary bacterial infection.      Other Visit Diagnoses       Acute cough       Relevant Orders   DG Chest 2 View (Completed)       Meds ordered this encounter  Medications   chlorpheniramine-HYDROcodone  (TUSSIONEX) 10-8 MG/5ML    Sig: Take 5 mLs by mouth every 12 (twelve) hours as needed for cough.    Dispense:  115 mL    Refill:  0   doxycycline  (VIBRA -TABS) 100 MG tablet    Sig: Take 1 tablet (100 mg total) by mouth 2 (two) times daily.    Dispense:  14 tablet    Refill:  0    Follow-up:  Return if symptoms worsen or fail to improve.  Jacqulyn Ahle DO [facility]

## 2023-04-22 NOTE — Assessment & Plan Note (Signed)
 Chest x-ray was obtained today and was independently reviewed by me.  Interpretation: Normal x-ray.  No evidence of pneumonia.  Previous x-ray done on 1/5 was independently reviewed today as well.  Interpretation: No acute cardiopulmonary abnormality.  No evidence of pneumonia.  Current x-ray is essentially unchanged and is normal.  Her clinical picture is concerning for secondary bacterial infection given recent influenza.  Tussionex as needed for cough.  Placing on antibiotic therapy to cover for secondary bacterial infection.

## 2023-04-23 LAB — CULTURE, BLOOD (ROUTINE X 2)
Culture: NO GROWTH
Culture: NO GROWTH
Culture: NO GROWTH
Special Requests: ADEQUATE
Special Requests: ADEQUATE

## 2023-05-13 ENCOUNTER — Ambulatory Visit: Payer: Medicaid Other | Admitting: Family Medicine

## 2023-05-14 ENCOUNTER — Other Ambulatory Visit: Payer: Self-pay | Admitting: Family Medicine

## 2023-05-14 DIAGNOSIS — I10 Essential (primary) hypertension: Secondary | ICD-10-CM

## 2023-05-21 ENCOUNTER — Other Ambulatory Visit: Payer: Self-pay

## 2023-05-21 ENCOUNTER — Encounter: Payer: Self-pay | Admitting: Family Medicine

## 2023-05-21 MED ORDER — INSULIN ASPART 100 UNIT/ML FLEXPEN: PEN_INJECTOR | SUBCUTANEOUS | 5 refills | Status: DC

## 2023-05-31 ENCOUNTER — Encounter: Payer: Self-pay | Admitting: Nurse Practitioner

## 2023-05-31 ENCOUNTER — Ambulatory Visit (INDEPENDENT_AMBULATORY_CARE_PROVIDER_SITE_OTHER): Payer: 59 | Admitting: Nurse Practitioner

## 2023-05-31 VITALS — BP 124/64 | HR 71 | Temp 98.2°F | Ht 63.0 in | Wt 258.0 lb

## 2023-05-31 DIAGNOSIS — E0789 Other specified disorders of thyroid: Secondary | ICD-10-CM | POA: Diagnosis not present

## 2023-05-31 DIAGNOSIS — E1069 Type 1 diabetes mellitus with other specified complication: Secondary | ICD-10-CM | POA: Diagnosis not present

## 2023-05-31 DIAGNOSIS — E785 Hyperlipidemia, unspecified: Secondary | ICD-10-CM | POA: Diagnosis not present

## 2023-05-31 DIAGNOSIS — R21 Rash and other nonspecific skin eruption: Secondary | ICD-10-CM | POA: Diagnosis not present

## 2023-05-31 DIAGNOSIS — I10 Essential (primary) hypertension: Secondary | ICD-10-CM

## 2023-05-31 MED ORDER — CLOBETASOL PROPIONATE 0.05 % EX CREA: 1.0000 | TOPICAL_CREAM | Freq: Two times a day (BID) | CUTANEOUS | 0 refills | Status: DC

## 2023-05-31 MED ORDER — INSULIN DETEMIR 100 UNIT/ML FLEXPEN: PEN_INJECTOR | SUBCUTANEOUS | 6 refills | Status: DC

## 2023-05-31 NOTE — Progress Notes (Signed)
 Subjective:    Patient ID: Kimberly Becker, female    DOB: 1980/09/20, 43 y.o.   MRN: [id number]  HPI Presents for routine follow-up of her diabetes.  Patient has experienced several major illnesses over the past few months.  Currently symptom-free and doing fine.  During that time her sugars were very high due to illness and medication she was given.  Lately the highest her sugars have been is 225.  Rare hypoglycemia usually around 2 to 3:00 in the morning.  Wears a Dexcom 7 sensor.  Currently on daily Levemir with NovoLog for sliding scale.  Adherent to medication regimen.  Plans to get her eye exam done later this spring after her tax refund since this will have to be paid for out-of-pocket.  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  Has supplies in case of hypoglycemia. Also complaints of a spot on her left elbow that has been there a long time.  Very pruritic.  There is also a faint rash on her right elbow but this is resolving. Has a family history of thyroid disorders.  Review of Systems  Constitutional:  Positive for fatigue.  HENT:  Positive for trouble swallowing. Negative for sore throat.   Respiratory:  Negative for cough, chest tightness, shortness of breath and wheezing.   Cardiovascular:  Negative for chest pain and leg swelling.  Skin:  Positive for rash.      05/31/2023    8:36 AM  Depression screen PHQ 2/9  Decreased Interest 0  Down, Depressed, Hopeless 0  PHQ - 2 Score 0  Altered sleeping 0  Tired, decreased energy 1  Change in appetite 0  Feeling bad or failure about yourself  0  Trouble concentrating 0  Moving slowly or fidgety/restless 0  Suicidal thoughts 0  PHQ-9 Score 1  Difficult doing work/chores Not difficult at all      05/31/2023    8:36 AM 04/22/2023   10:50 AM 02/26/2023    9:31 AM 02/10/2023   10:40 AM  GAD 7 : Generalized Anxiety Score  Nervous, Anxious, on Edge 0 0 0 0  Control/stop worrying 0 0 0 0  Worry  too much - different things 0 0 0 0  Trouble relaxing 0 0 0 0  Restless 0 0 0 0  Easily annoyed or irritable 0 0 0 0  Afraid - awful might happen 0 0 0 0  Total GAD 7 Score 0 0 0 0  Anxiety Difficulty Not difficult at all Not difficult at all Not difficult at all Not difficult at all         Objective:   Physical Exam NAD.  Alert, oriented.  Calm cheerful affect.  Thyroid no mass or goiter noted, noticeably tender to palpation.  Note this is also the area where patient has difficulty swallowing at times.  Lungs clear.  Heart regular rate rhythm.  Pink raised well-defined approximately 1-1/2 cm plaque noted on the extensor surface of the left elbow.  Nontender to palpation.  Faint minimally raised pink papular rash noted on the extensor surface of the right elbow.  Diabetic Foot Exam - Simple   Simple Foot Form Visual Inspection No deformities, no ulcerations, no other skin breakdown bilaterally: Yes Sensation Testing Intact to touch and monofilament testing bilaterally: Yes Pulse Check See comments: Yes Comments DP pulses present bilaterally. Toes warm with normal cap refill.     Today's Vitals   05/31/23 4098  BP: 124/64  Pulse: 71  Temp: 98.2 F (36.8 C)  SpO2: 98%  Weight: 258 lb (117 kg)  Height: 5\' 3"  (1.6 m)   Body mass index is 45.7 kg/m.      Assessment & Plan:   Problem List Items Addressed This Visit       Cardiovascular and Mediastinum   Essential hypertension   Relevant Orders   Comprehensive metabolic panel   Lipid panel     Endocrine   Type 1 diabetes mellitus (HCC) - Primary   Relevant Medications   insulin detemir (LEVEMIR) 100 UNIT/ML FlexPen   Other Relevant Orders   Comprehensive metabolic panel   Hemoglobin A1c   Lipid panel     Other   Hyperlipidemia   Relevant Orders   Lipid panel   Thyroid pain   Relevant Orders   T4, free   TSH   Thyroid antibodies   Other Visit Diagnoses       Rash and nonspecific skin eruption           Meds ordered this encounter  Medications   insulin detemir (LEVEMIR) 100 UNIT/ML FlexPen    Sig: Inject 20 units into the skin qhs    Dispense:  15 mL    Refill:  6    Supervising Provider:   Lilyan Punt A [[id number]]   clobetasol cream (TEMOVATE) 0.05 %    Sig: Apply 1 Application topically 2 (two) times daily.    Dispense:  30 g    Refill:  0    Supervising Provider:   Babs Sciara [phone]  Labs ordered including a workup for thyroiditis. Refill Levemir.  Continue other medications as directed. Question whether the plaque on the right extensor surface of the elbow may be early form of psoriasis but no clear evidence at this time.  Trial of clobetasol cream as directed. For itching: Claritin or Allegra in the morning Pepcid twice a day Benadryl at night Call back if no improvement in rash or itching. Return in about 3 months (around 08/28/2023).

## 2023-05-31 NOTE — Patient Instructions (Signed)
 Claritin or Allegra in the morning Pepcid twice a day Benadryl at night

## 2023-06-01 ENCOUNTER — Encounter: Payer: Self-pay | Admitting: Nurse Practitioner

## 2023-06-01 LAB — COMPREHENSIVE METABOLIC PANEL
ALT: 21 [IU]/L (ref 0–32)
AST: 22 [IU]/L (ref 0–40)
Albumin: 4.4 g/dL (ref 3.9–4.9)
Alkaline Phosphatase: 75 [IU]/L (ref 44–121)
BUN/Creatinine Ratio: 16 (ref 9–23)
BUN: 14 mg/dL (ref 6–24)
Bilirubin Total: 0.4 mg/dL (ref 0.0–1.2)
CO2: 23 mmol/L (ref 20–29)
Calcium: 9.5 mg/dL (ref 8.7–10.2)
Chloride: 99 mmol/L (ref 96–106)
Creatinine, Ser: 0.86 mg/dL (ref 0.57–1.00)
Globulin, Total: 2.7 g/dL (ref 1.5–4.5)
Glucose: 196 mg/dL — ABNORMAL HIGH (ref 70–99)
Potassium: 4.3 mmol/L (ref 3.5–5.2)
Sodium: 138 mmol/L (ref 134–144)
Total Protein: 7.1 g/dL (ref 6.0–8.5)
eGFR: 85 mL/min/{1.73_m2} (ref >59)

## 2023-06-01 LAB — THYROID ANTIBODIES
Thyroglobulin Antibody: <1 [IU]/mL (ref 0.0–0.9)
Thyroperoxidase Ab SerPl-aCnc: 11 [IU]/mL (ref 0–34)

## 2023-06-01 LAB — T4, FREE: Free T4: 1.23 ng/dL (ref 0.82–1.77)

## 2023-06-01 LAB — LIPID PANEL
Chol/HDL Ratio: 2.6 {ratio} (ref 0.0–4.4)
Cholesterol, Total: 156 mg/dL (ref 100–199)
HDL: 61 mg/dL (ref >39)
LDL Chol Calc (NIH): 77 mg/dL (ref 0–99)
Triglycerides: 96 mg/dL (ref 0–149)
VLDL Cholesterol Cal: 18 mg/dL (ref 5–40)

## 2023-06-01 LAB — HEMOGLOBIN A1C
Est. average glucose Bld gHb Est-mCnc: 148 mg/dL
Hgb A1c MFr Bld: 6.8 % — ABNORMAL HIGH (ref 4.8–5.6)

## 2023-06-01 LAB — TSH: TSH: 2.46 u[IU]/mL (ref 0.450–4.500)

## 2023-06-13 ENCOUNTER — Encounter: Payer: Self-pay | Admitting: Family Medicine

## 2023-06-14 ENCOUNTER — Other Ambulatory Visit: Payer: Self-pay | Admitting: Family Medicine

## 2023-06-14 MED ORDER — FUROSEMIDE 40 MG PO TABS: 40.0000 mg | ORAL_TABLET | Freq: Every day | ORAL | 3 refills | Status: DC

## 2023-06-17 DIAGNOSIS — Z8249 Family history of ischemic heart disease and other diseases of the circulatory system: Secondary | ICD-10-CM | POA: Diagnosis not present

## 2023-06-17 DIAGNOSIS — E876 Hypokalemia: Secondary | ICD-10-CM | POA: Diagnosis not present

## 2023-06-17 DIAGNOSIS — G4700 Insomnia, unspecified: Secondary | ICD-10-CM | POA: Diagnosis not present

## 2023-06-17 DIAGNOSIS — I10 Essential (primary) hypertension: Secondary | ICD-10-CM | POA: Diagnosis not present

## 2023-06-17 DIAGNOSIS — I872 Venous insufficiency (chronic) (peripheral): Secondary | ICD-10-CM | POA: Diagnosis not present

## 2023-06-17 DIAGNOSIS — R609 Edema, unspecified: Secondary | ICD-10-CM | POA: Diagnosis not present

## 2023-06-17 DIAGNOSIS — E11620 Type 2 diabetes mellitus with diabetic dermatitis: Secondary | ICD-10-CM | POA: Diagnosis not present

## 2023-06-17 DIAGNOSIS — E785 Hyperlipidemia, unspecified: Secondary | ICD-10-CM | POA: Diagnosis not present

## 2023-06-17 DIAGNOSIS — Z794 Long term (current) use of insulin: Secondary | ICD-10-CM | POA: Diagnosis not present

## 2023-06-17 DIAGNOSIS — Z833 Family history of diabetes mellitus: Secondary | ICD-10-CM | POA: Diagnosis not present

## 2023-06-17 DIAGNOSIS — K219 Gastro-esophageal reflux disease without esophagitis: Secondary | ICD-10-CM | POA: Diagnosis not present

## 2023-06-22 ENCOUNTER — Encounter: Payer: Self-pay | Admitting: Nurse Practitioner

## 2023-06-25 ENCOUNTER — Other Ambulatory Visit: Payer: Self-pay | Admitting: Nurse Practitioner

## 2023-06-28 ENCOUNTER — Other Ambulatory Visit: Payer: Self-pay | Admitting: Nurse Practitioner

## 2023-06-28 ENCOUNTER — Encounter: Payer: Self-pay | Admitting: Nurse Practitioner

## 2023-06-28 MED ORDER — FUROSEMIDE 40 MG PO TABS: ORAL_TABLET | ORAL | 0 refills | Status: DC

## 2023-07-06 DIAGNOSIS — H5213 Myopia, bilateral: Secondary | ICD-10-CM | POA: Diagnosis not present

## 2023-07-30 ENCOUNTER — Telehealth: Payer: Self-pay

## 2023-07-30 MED ORDER — INSULIN DETEMIR 100 UNIT/ML FLEXPEN: PEN_INJECTOR | SUBCUTANEOUS | 6 refills | Status: DC

## 2023-07-30 NOTE — Telephone Encounter (Signed)
 Prescription Request  07/30/2023  LOV: Visit date not found  What is the name of the medication or equipment? insulin  detemir (LEVEMIR ) 100 UNIT/ML FlexPen   Have you contacted your pharmacy to request a refill? Yes   Which pharmacy would you like this sent to?  [facility] - [place], [place] - [address] [place] [address] Phone: [phone] Fax: [phone]    Patient notified that their request is being sent to the clinical staff for review and that they should receive a response within 2 business days.   Please advise at Mobile [phone] (mobile)

## 2023-07-30 NOTE — Telephone Encounter (Signed)
 [place], [place], [place]     Refilled.

## 2023-07-31 ENCOUNTER — Encounter: Payer: Self-pay | Admitting: Nurse Practitioner

## 2023-08-04 ENCOUNTER — Other Ambulatory Visit: Payer: Self-pay | Admitting: Family Medicine

## 2023-08-04 DIAGNOSIS — I10 Essential (primary) hypertension: Secondary | ICD-10-CM

## 2023-08-12 ENCOUNTER — Other Ambulatory Visit: Payer: Self-pay | Admitting: Family Medicine

## 2023-08-12 ENCOUNTER — Encounter: Payer: Self-pay | Admitting: Family Medicine

## 2023-08-13 MED ORDER — INSULIN DEGLUDEC 200 UNIT/ML ~~LOC~~ SOPN: 20.0000 [IU] | PEN_INJECTOR | Freq: Every day | SUBCUTANEOUS | 3 refills | Status: DC

## 2023-08-26 ENCOUNTER — Ambulatory Visit (INDEPENDENT_AMBULATORY_CARE_PROVIDER_SITE_OTHER): Payer: 59 | Admitting: Family Medicine

## 2023-08-26 ENCOUNTER — Encounter: Payer: Self-pay | Admitting: Family Medicine

## 2023-08-26 VITALS — BP 114/71 | HR 102 | Temp 98.1°F | Ht 63.0 in | Wt 264.0 lb

## 2023-08-26 DIAGNOSIS — I10 Essential (primary) hypertension: Secondary | ICD-10-CM | POA: Diagnosis not present

## 2023-08-26 DIAGNOSIS — E785 Hyperlipidemia, unspecified: Secondary | ICD-10-CM

## 2023-08-26 DIAGNOSIS — R600 Localized edema: Secondary | ICD-10-CM | POA: Diagnosis not present

## 2023-08-26 DIAGNOSIS — E1069 Type 1 diabetes mellitus with other specified complication: Secondary | ICD-10-CM | POA: Diagnosis not present

## 2023-08-26 NOTE — Patient Instructions (Signed)
Referral placed.  Follow up in 6 months.

## 2023-08-29 NOTE — Assessment & Plan Note (Addendum)
 Likely secondary to mild venous insufficiency and morbid obesity.  Referral to PT (Lymphedema clinic). Advised compression.

## 2023-08-29 NOTE — Assessment & Plan Note (Signed)
 Stable. Continue Crestor.

## 2023-08-29 NOTE — Assessment & Plan Note (Signed)
 Stable on Lasix  and Valsartan . Continue.

## 2023-08-29 NOTE — Progress Notes (Signed)
 Subjective:  Patient ID: Kimberly Becker, female    DOB: 11-26-1980  Age: 43 y.o. MRN: [id number]  CC:   Chief Complaint  Patient presents with   3 month follow up    C/o swelling in legs, ankles, feet - taking furosemide  daily    HPI:  43 year old female with Type 1 Diabetes, HTN, Migraine, Morbid obesity, HLD presents for follow up.  Patient reports worsening lower extremity edema.  She has been evaluated by vascular who recommend compression therapy. Patient is also on Lasix . Worse at the end of the day. No chest pain. No significant SOB.  BP well controlled.   A1c at goal.  Most recent LDL 77. Tolerating Crestor .   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 05/13/2021   Lower extremity edema 12/21/2020   Gastroesophageal reflux disease without esophagitis 09/19/2020   Menstrual periods irregular 01/16/2020   Type 1 diabetes mellitus (HCC) 09/26/2014   Anxiety 06/26/2013   Hyperlipidemia 03/20/2013   Morbid obesity (HCC) 11/08/2012   Migraines 08/10/2012    Social Hx   Social History   Socioeconomic History   Marital status: Married    Spouse name: Not on file   Number of children: Not on file   Years of education: 14   Highest education level: Associate degree: academic program  Occupational History   Occupation: Manufacturing systems engineer  Tobacco Use   Smoking status: Never   Smokeless tobacco: Never  Vaping Use   Vaping status: Never Used  Substance and Sexual Activity   Alcohol use: No   Drug use: No   Sexual activity: Yes    Birth control/protection: None    Comment: heterosexual relationship  Other Topics Concern   Not on file  Social History Narrative   Not on file   Social Drivers of Health   Financial Resource Strain: Low Risk  (08/10/2022)   Overall Financial Resource Strain (CARDIA)    Difficulty of Paying Living  Expenses: Not very hard  Food Insecurity: No Food Insecurity (08/10/2022)   Hunger Vital Sign    Worried About Running Out of Food in the Last Year: Never true    Ran Out of Food in the Last Year: Never true  Transportation Needs: No Transportation Needs (08/10/2022)   PRAPARE - Administrator, Civil Service (Medical): No    Lack of Transportation (Non-Medical): No  Physical Activity: Unknown (08/10/2022)   Exercise Vital Sign    Days of Exercise per Week: 2 days    Minutes of Exercise per Session: Not on file  Stress: No Stress Concern Present (08/10/2022)   [employer] of Occupational Health - Occupational Stress Questionnaire    Feeling of Stress : Only a little  Social Connections: Unknown (08/10/2022)   Social Connection and Isolation Panel [NHANES]    Frequency of Communication with Friends and Family: More than three times a week    Frequency of Social Gatherings with Friends and Family: Once a week    Attends Religious Services: Patient declined    Database administrator or Organizations: No    Attends Engineer, structural: Not on file    Marital Status: Married    Review of Systems Per HPI  Objective:  BP 114/71   Pulse (!) 102   Temp 98.1 F (36.7 C)   Ht 5\' 3"  (1.6 m)  Wt 264 lb (119.7 kg)   SpO2 98%   BMI 46.77 kg/m      08/26/2023    1:14 PM 05/31/2023    8:24 AM 04/22/2023   10:41 AM  BP/Weight  Systolic BP 114 124 142  Diastolic BP 71 64 67  Wt. (Lbs) 264 258 248.6  BMI 46.77 kg/m2 45.7 kg/m2 44.04 kg/m2    Physical Exam Vitals and nursing note reviewed.  Constitutional:      General: She is not in acute distress.    Appearance: Normal appearance. She is obese.  HENT:     Head: Normocephalic and atraumatic.  Eyes:     General:        Right eye: No discharge.        Left eye: No discharge.     Conjunctiva/sclera: Conjunctivae normal.  Cardiovascular:     Rate and Rhythm: Normal rate and regular rhythm.  Pulmonary:      Effort: Pulmonary effort is normal.     Breath sounds: Normal breath sounds. No wheezing, rhonchi or rales.  Musculoskeletal:     Comments: 1+ bilateral lower extremity edema noted.  Neurological:     Mental Status: She is alert.  Psychiatric:        Mood and Affect: Mood normal.        Behavior: Behavior normal.     Lab Results  Component Value Date   WBC 13.3 (H) 04/18/2023   HGB 13.6 04/18/2023   HCT 41.2 04/18/2023   PLT 270 04/18/2023   GLUCOSE 196 (H) 05/31/2023   CHOL 156 05/31/2023   TRIG 96 05/31/2023   HDL 61 05/31/2023   LDLCALC 77 05/31/2023   ALT 21 05/31/2023   AST 22 05/31/2023   NA 138 05/31/2023   K 4.3 05/31/2023   CL 99 05/31/2023   CREATININE 0.86 05/31/2023   BUN 14 05/31/2023   CO2 23 05/31/2023   TSH 2.460 05/31/2023   HGBA1C 6.8 (H) 05/31/2023   MICROALBUR 0.2 05/18/2014     Assessment & Plan:  Lower extremity edema Assessment & Plan: Likely secondary to mild venous insufficiency and morbid obesity.  Referral to PT (Lymphedema clinic). Advised compression.  Orders: -     Ambulatory referral to Physical Therapy  Essential hypertension Assessment & Plan: Stable on Lasix  and Valsartan . Continue.   Type 1 diabetes mellitus with other specified complication (HCC) Assessment & Plan: A1c at goal.   Hyperlipidemia, unspecified hyperlipidemia type Assessment & Plan: Stable. Continue Crestor .    Follow-up:  6 months  Delonte Musich Debrah Fan DO [facility]

## 2023-08-29 NOTE — Assessment & Plan Note (Signed)
 A1c at goal.

## 2023-10-11 ENCOUNTER — Other Ambulatory Visit: Payer: Self-pay | Admitting: Family Medicine

## 2023-10-11 ENCOUNTER — Telehealth: Payer: Self-pay | Admitting: Family Medicine

## 2023-10-11 ENCOUNTER — Other Ambulatory Visit: Payer: Self-pay

## 2023-10-11 ENCOUNTER — Encounter: Payer: Self-pay | Admitting: Family Medicine

## 2023-10-11 MED ORDER — INSULIN ASPART 100 UNIT/ML FLEXPEN: PEN_INJECTOR | SUBCUTANEOUS | 5 refills | Status: DC

## 2023-10-11 NOTE — Telephone Encounter (Unsigned)
 Copied from CRM [id number]. Topic: Clinical - Medication Refill >> Oct 11, 2023 12:26 PM Tiffini S wrote: Medication: nsulin aspart (NOVOLOG ) 100 UNIT/ML FlexPen  Has the patient contacted their pharmacy? Yes, [facility] with [facility] and they are waiting on a faxed request from the clinic   This is the patient's preferred pharmacy:  [facility] - [place], [place] - [address] [place] [address] Phone: [phone] Fax: [phone]  Is this the correct pharmacy for this prescription? Yes If no, delete pharmacy and type the correct one.   Has the prescription been filled recently? Yes  Is the patient out of the medication? Yes, have one pen left that she will take today and tomorrow afternoon  Has the patient been seen for an appointment in the last year OR does the patient have an upcoming appointment? Yes  Can we respond through MyChart? Yes  Agent: Please be advised that Rx refills may take up to 3 business days. We ask that you follow-up with your pharmacy.

## 2023-10-11 NOTE — Telephone Encounter (Signed)
 Message was sent to wrong office  Will forward to correct provider

## 2023-10-12 ENCOUNTER — Other Ambulatory Visit: Payer: Self-pay

## 2023-10-12 MED ORDER — INSULIN ASPART 100 UNIT/ML FLEXPEN: PEN_INJECTOR | SUBCUTANEOUS | 5 refills | Status: DC

## 2023-10-12 NOTE — Telephone Encounter (Signed)
 Noted

## 2023-10-30 ENCOUNTER — Encounter: Payer: Self-pay | Admitting: Family Medicine

## 2023-10-30 ENCOUNTER — Other Ambulatory Visit: Payer: Self-pay | Admitting: Family Medicine

## 2023-10-30 DIAGNOSIS — I10 Essential (primary) hypertension: Secondary | ICD-10-CM

## 2023-11-01 ENCOUNTER — Other Ambulatory Visit: Payer: Self-pay

## 2023-11-01 DIAGNOSIS — I10 Essential (primary) hypertension: Secondary | ICD-10-CM

## 2023-11-01 MED ORDER — POTASSIUM CHLORIDE ER 10 MEQ PO TBCR: 20.0000 meq | EXTENDED_RELEASE_TABLET | Freq: Every day | ORAL | 2 refills | Status: AC

## 2023-11-01 MED ORDER — VALSARTAN 40 MG PO TABS: ORAL_TABLET | ORAL | 2 refills | Status: AC

## 2023-11-02 ENCOUNTER — Other Ambulatory Visit: Payer: Self-pay | Admitting: Family Medicine

## 2023-11-26 ENCOUNTER — Ambulatory Visit (INDEPENDENT_AMBULATORY_CARE_PROVIDER_SITE_OTHER): Admitting: Family Medicine

## 2023-11-26 VITALS — BP 110/69 | HR 85 | Temp 98.2°F | Ht 63.0 in | Wt 268.0 lb

## 2023-11-26 DIAGNOSIS — I10 Essential (primary) hypertension: Secondary | ICD-10-CM

## 2023-11-26 DIAGNOSIS — E785 Hyperlipidemia, unspecified: Secondary | ICD-10-CM

## 2023-11-26 DIAGNOSIS — E1069 Type 1 diabetes mellitus with other specified complication: Secondary | ICD-10-CM

## 2023-11-26 NOTE — Patient Instructions (Signed)
 Labs today.  Continue your meds.  Follow up in 6 months.

## 2023-11-28 ENCOUNTER — Encounter: Payer: Self-pay | Admitting: Family Medicine

## 2023-11-29 ENCOUNTER — Other Ambulatory Visit: Payer: Self-pay

## 2023-11-29 MED ORDER — FUROSEMIDE 40 MG PO TABS: ORAL_TABLET | ORAL | 0 refills | Status: DC

## 2023-11-29 MED ORDER — ROSUVASTATIN CALCIUM 20 MG PO TABS: 20.0000 mg | ORAL_TABLET | Freq: Every day | ORAL | 3 refills | Status: AC

## 2023-11-29 NOTE — Progress Notes (Signed)
 Subjective:  Patient ID: Kimberly Becker, female    DOB: [birth year]  Age: 43 y.o. MRN: [id number]  CC: Follow-up   HPI:  43 year old female presents for follow-up.  Has been doing well.  A1c has been at goal.  Lipids have been fairly well-controlled on statin.  BP well-controlled.  Patient states that she is feeling well.  No chest pain or shortness of breath.  She has no complaints or concerns at this time.  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 05/13/2021   Lower extremity edema 12/21/2020   Gastroesophageal reflux disease without esophagitis 09/19/2020   Menstrual periods irregular 01/16/2020   Type 1 diabetes mellitus (HCC) 09/26/2014   Anxiety 06/26/2013   Hyperlipidemia 03/20/2013   Morbid obesity (HCC) 11/08/2012   Migraines 08/10/2012    Social Hx   Social History   Socioeconomic History   Marital status: Married    Spouse name: Not on file   Number of children: Not on file   Years of education: 14   Highest education level: Associate degree: academic program  Occupational History   Occupation: Manufacturing systems engineer  Tobacco Use   Smoking status: Never   Smokeless tobacco: Never  Vaping Use   Vaping status: Never Used  Substance and Sexual Activity   Alcohol use: No   Drug use: No   Sexual activity: Yes    Birth control/protection: None    Comment: heterosexual relationship  Other Topics Concern   Not on file  Social History Narrative   Not on file   Social Drivers of Health   Financial Resource Strain: Low Risk  (08/10/2022)   Overall Financial Resource Strain (CARDIA)    Difficulty of Paying Living Expenses: Not very hard  Food Insecurity: No Food Insecurity (08/10/2022)   Hunger Vital Sign    Worried About Running Out of Food in the Last Year: Never true    Ran Out of Food in the Last Year: Never true   Transportation Needs: No Transportation Needs (08/10/2022)   PRAPARE - Administrator, Civil Service (Medical): No    Lack of Transportation (Non-Medical): No  Physical Activity: Unknown (08/10/2022)   Exercise Vital Sign    Days of Exercise per Week: 2 days    Minutes of Exercise per Session: Not on file  Stress: No Stress Concern Present (08/10/2022)   [employer] of Occupational Health - Occupational Stress Questionnaire    Feeling of Stress : Only a little  Social Connections: Unknown (08/10/2022)   Social Connection and Isolation Panel    Frequency of Communication with Friends and Family: More than three times a week    Frequency of Social Gatherings with Friends and Family: Once a week    Attends Religious Services: Patient declined    Database administrator or Organizations: No    Attends Engineer, structural: Not on file    Marital Status: Married    Review of Systems Per HPI  Objective:  BP 110/69   Pulse 85   Temp 98.2 F (36.8 C)   Ht 5' 3 (1.6 m)   Wt 268 lb (121.6 kg)   SpO2 96%   BMI 47.47 kg/m      11/26/2023   11:06 AM 08/26/2023    1:14 PM 05/31/2023    8:24 AM  BP/Weight  Systolic BP 110 114 124  Diastolic BP 69 71 64  Wt. (Lbs) 268 264 258  BMI 47.47 kg/m2 46.77 kg/m2 45.7 kg/m2    Physical Exam Vitals and nursing note reviewed.  Constitutional:      General: She is not in acute distress.    Appearance: Normal appearance. She is obese.  HENT:     Head: Normocephalic and atraumatic.  Eyes:     General:        Right eye: No discharge.        Left eye: No discharge.     Conjunctiva/sclera: Conjunctivae normal.  Cardiovascular:     Rate and Rhythm: Normal rate and regular rhythm.  Pulmonary:     Effort: Pulmonary effort is normal.     Breath sounds: Normal breath sounds. No wheezing, rhonchi or rales.  Neurological:     Mental Status: She is alert.  Psychiatric:        Mood and Affect: Mood normal.         Behavior: Behavior normal.     Lab Results  Component Value Date   WBC 13.3 (H) 04/18/2023   HGB 13.6 04/18/2023   HCT 41.2 04/18/2023   PLT 270 04/18/2023   GLUCOSE 196 (H) 05/31/2023   CHOL 156 05/31/2023   TRIG 96 05/31/2023   HDL 61 05/31/2023   LDLCALC 77 05/31/2023   ALT 21 05/31/2023   AST 22 05/31/2023   NA 138 05/31/2023   K 4.3 05/31/2023   CL 99 05/31/2023   CREATININE 0.86 05/31/2023   BUN 14 05/31/2023   CO2 23 05/31/2023   TSH 2.460 05/31/2023   HGBA1C 6.8 (H) 05/31/2023   MICROALBUR 0.2 05/18/2014     Assessment & Plan:  Essential hypertension Assessment & Plan: Stable.  Continue current medications.   Type 1 diabetes mellitus with other specified complication [facility]) Assessment & Plan: Needs updated A1c.  Last A1c at goal.  Continue insulin .  Orders: -     Basic metabolic panel with GFR -     Hemoglobin A1c -     Microalbumin / creatinine urine ratio  Hyperlipidemia, unspecified hyperlipidemia type Assessment & Plan: Stable.  Continue statin.  Orders: -     Lipid panel -     Rosuvastatin  Calcium ; Take 1 tablet (20 mg total) by mouth daily.  Dispense: 90 tablet; Refill: 3    Follow-up: 6 months  Gavynn Duvall Bluford DO [facility]

## 2023-11-29 NOTE — Assessment & Plan Note (Signed)
 Stable.  Continue current medications.

## 2023-11-29 NOTE — Assessment & Plan Note (Signed)
 Needs updated A1c.  Last A1c at goal.  Continue insulin .

## 2023-11-29 NOTE — Assessment & Plan Note (Signed)
 Stable.  Continue statin.

## 2023-12-15 ENCOUNTER — Encounter: Payer: Self-pay | Admitting: Family Medicine

## 2023-12-16 ENCOUNTER — Telehealth: Payer: Self-pay | Admitting: Pharmacy Technician

## 2023-12-16 ENCOUNTER — Other Ambulatory Visit (HOSPITAL_COMMUNITY): Payer: Self-pay

## 2023-12-16 NOTE — Telephone Encounter (Signed)
 Pharmacy Patient Advocate Encounter  Received notification from [employer] that Prior Authorization for Dexcom G7 Sensor has been APPROVED from 12/16/2023 to 06/13/2024   PA #/Case ID/Reference #: [id number]

## 2023-12-16 NOTE — Telephone Encounter (Signed)
 Pharmacy Patient Advocate Encounter   Received notification from CoverMyMeds that prior authorization for Dexcom G7 Sensor is required/requested.   Insurance verification completed.   The patient is insured through [employer] .   Per test claim: PA required; PA submitted to above mentioned insurance via Latent Key/confirmation #/EOC [id number] Status is pending

## 2024-01-14 ENCOUNTER — Telehealth: Payer: Self-pay | Admitting: Pharmacy Technician

## 2024-01-14 ENCOUNTER — Encounter: Payer: Self-pay | Admitting: Family Medicine

## 2024-01-14 ENCOUNTER — Other Ambulatory Visit: Payer: Self-pay | Admitting: Family Medicine

## 2024-01-14 ENCOUNTER — Other Ambulatory Visit (HOSPITAL_COMMUNITY): Payer: Self-pay

## 2024-01-14 MED ORDER — INSULIN ASPART 100 UNIT/ML FLEXPEN: PEN_INJECTOR | SUBCUTANEOUS | 5 refills | Status: DC

## 2024-01-14 NOTE — Telephone Encounter (Signed)
 Pharmacy Patient Advocate Encounter   Received notification from Onbase that prior authorization for Tresiba  FlexTouch (insulin  degludec injection) 200 Units/mL solution is required/requested.   Insurance verification completed.   The patient is insured through [employer].   Per test claim:  see below is preferred by the insurance.  If suggested medication is appropriate, Please send in a new RX and discontinue this one. If not, please advise as to why it's not appropriate so that we may request a Prior Authorization. Please note, some preferred medications may still require a PA.  If the suggested medications have not been trialed and there are no contraindications to their use, the PA will not be submitted, as it will not be approved.

## 2024-01-16 ENCOUNTER — Other Ambulatory Visit: Payer: Self-pay | Admitting: Family Medicine

## 2024-01-16 MED ORDER — INSULIN GLARGINE 100 UNIT/ML SOLOSTAR PEN: 18.0000 [IU] | PEN_INJECTOR | Freq: Every day | SUBCUTANEOUS | 3 refills | Status: DC

## 2024-01-17 ENCOUNTER — Other Ambulatory Visit (HOSPITAL_COMMUNITY): Payer: Self-pay

## 2024-01-17 ENCOUNTER — Telehealth: Payer: Self-pay | Admitting: Pharmacy Technician

## 2024-01-17 NOTE — Telephone Encounter (Signed)
 Pharmacy Patient Advocate Encounter   Received notification from CoverMyMeds that prior authorization for NovoLOG  FlexPen 100UNIT/ML pen-injectors is required/requested.   Insurance verification completed.   The patient is insured through [employer].   Per test claim:  Insulin  Aspart Flexpen 100 unit/ml  is preferred by the insurance.  If suggested medication is appropriate, Please send in a new RX and discontinue this one. If not, please advise as to why it's not appropriate so that we may request a Prior Authorization. Please note, some preferred medications may still require a PA.  If the suggested medications have not been trialed and there are no contraindications to their use, the PA will not be submitted, as it will not be approved.  New prescription is not needed. Meds are interchangeable at the pharmacy. [facility] dispensed the preferred product on 01/14/2024. Refill is payable on or after 01/28/2024.

## 2024-01-20 ENCOUNTER — Encounter: Payer: Self-pay | Admitting: Nurse Practitioner

## 2024-01-21 ENCOUNTER — Other Ambulatory Visit: Payer: Self-pay | Admitting: Nurse Practitioner

## 2024-01-21 MED ORDER — PANTOPRAZOLE SODIUM 40 MG PO TBEC: 40.0000 mg | DELAYED_RELEASE_TABLET | Freq: Every day | ORAL | 0 refills | Status: DC

## 2024-02-11 ENCOUNTER — Encounter: Payer: Self-pay | Admitting: Family Medicine

## 2024-02-11 ENCOUNTER — Other Ambulatory Visit: Payer: Self-pay | Admitting: Family Medicine

## 2024-02-13 ENCOUNTER — Other Ambulatory Visit: Payer: Self-pay | Admitting: Family Medicine

## 2024-02-13 MED ORDER — DEXCOM G7 SENSOR MISC: 3 refills | Status: AC

## 2024-03-10 ENCOUNTER — Encounter: Payer: Self-pay | Admitting: Family Medicine

## 2024-03-15 ENCOUNTER — Encounter: Payer: Self-pay | Admitting: Family Medicine

## 2024-03-16 ENCOUNTER — Other Ambulatory Visit: Payer: Self-pay | Admitting: Family Medicine

## 2024-03-16 MED ORDER — INSULIN GLARGINE 100 UNIT/ML SOLOSTAR PEN: 20.0000 [IU] | PEN_INJECTOR | Freq: Every day | SUBCUTANEOUS | 3 refills | Status: AC

## 2024-04-09 ENCOUNTER — Encounter: Payer: Self-pay | Admitting: Nurse Practitioner

## 2024-04-09 ENCOUNTER — Other Ambulatory Visit: Payer: Self-pay | Admitting: Nurse Practitioner

## 2024-04-10 ENCOUNTER — Other Ambulatory Visit: Payer: Self-pay

## 2024-04-10 MED ORDER — PANTOPRAZOLE SODIUM 40 MG PO TBEC: 40.0000 mg | DELAYED_RELEASE_TABLET | Freq: Every day | ORAL | 0 refills | Status: AC

## 2024-05-04 ENCOUNTER — Encounter: Payer: Self-pay | Admitting: Family Medicine

## 2024-05-04 MED ORDER — INSULIN ASPART 100 UNIT/ML FLEXPEN: PEN_INJECTOR | SUBCUTANEOUS | 5 refills | Status: AC

## 2024-05-13 ENCOUNTER — Other Ambulatory Visit: Payer: Self-pay | Admitting: Family Medicine

## 2024-05-13 ENCOUNTER — Encounter: Payer: Self-pay | Admitting: Family Medicine

## 2024-05-15 ENCOUNTER — Other Ambulatory Visit: Payer: Self-pay

## 2024-05-15 MED ORDER — FUROSEMIDE 40 MG PO TABS: ORAL_TABLET | ORAL | 0 refills | Status: AC

## 2024-05-15 MED ORDER — TRAZODONE HCL 100 MG PO TABS: 100.0000 mg | ORAL_TABLET | Freq: Every day | ORAL | 3 refills | Status: AC

## 2024-05-17 ENCOUNTER — Telehealth: Payer: Self-pay | Admitting: Pharmacy Technician

## 2024-05-17 ENCOUNTER — Other Ambulatory Visit (HOSPITAL_COMMUNITY): Payer: Self-pay

## 2024-05-17 NOTE — Telephone Encounter (Signed)
 Pharmacy Patient Advocate Encounter   Received notification from [facility] KEY that prior authorization for Dexcom G7 Sensor  is due for renewal.   Insurance verification completed.   The patient is insured through [employer].  Action: PA required; PA started via CoverMyMeds. KEY [id number] . Waiting for clinical questions to populate.

## 2024-05-18 ENCOUNTER — Other Ambulatory Visit (HOSPITAL_COMMUNITY): Payer: Self-pay

## 2024-05-18 NOTE — Telephone Encounter (Signed)
 Pharmacy Patient Advocate Encounter  Received notification from [employer] that Prior Authorization for Dexcom G7 Sensor has been APPROVED from 05/17/24 to 05/17/25. Unable to obtain price due to refill too soon rejection, last fill date 05/06/24 next available fill date02/16/26   PA #/Case ID/Reference #: [id number]

## 2024-05-29 ENCOUNTER — Ambulatory Visit: Admitting: Family Medicine
